# Patient Record
Sex: Male | Born: 1945 | Race: White | Hispanic: No | Marital: Married | Smoking: Never smoker
Health system: Southern US, Community
[De-identification: ages and names within clinical notes are randomized; demographics above are authoritative.]

## PROBLEM LIST (undated history)

## (undated) DIAGNOSIS — I6522 Occlusion and stenosis of left carotid artery: Secondary | ICD-10-CM

## (undated) DIAGNOSIS — M109 Gout, unspecified: Secondary | ICD-10-CM

## (undated) DIAGNOSIS — S76111A Strain of right quadriceps muscle, fascia and tendon, initial encounter: Secondary | ICD-10-CM

## (undated) DIAGNOSIS — I10 Essential (primary) hypertension: Secondary | ICD-10-CM

## (undated) DIAGNOSIS — C61 Malignant neoplasm of prostate: Secondary | ICD-10-CM

## (undated) HISTORY — PX: PROSTATE BIOPSY: SHX241

## (undated) HISTORY — DX: Occlusion and stenosis of left carotid artery: I65.22

## (undated) HISTORY — PX: CYST EXCISION: SHX5701

## (undated) HISTORY — PX: HYDROCELE EXCISION: SHX482

---

## 1999-09-30 ENCOUNTER — Emergency Department (HOSPITAL_COMMUNITY): Admission: EM | Admit: 1999-09-30 | Discharge: 1999-09-30 | Payer: Self-pay | Admitting: Emergency Medicine

## 1999-09-30 ENCOUNTER — Encounter: Payer: Self-pay | Admitting: Emergency Medicine

## 2000-08-17 ENCOUNTER — Encounter (INDEPENDENT_AMBULATORY_CARE_PROVIDER_SITE_OTHER): Payer: Self-pay | Admitting: Specialist

## 2000-08-17 ENCOUNTER — Ambulatory Visit (HOSPITAL_COMMUNITY): Admission: RE | Admit: 2000-08-17 | Discharge: 2000-08-17 | Payer: Self-pay | Admitting: Gastroenterology

## 2004-06-07 ENCOUNTER — Encounter: Admission: RE | Admit: 2004-06-07 | Discharge: 2004-06-07 | Payer: Self-pay | Admitting: Nephrology

## 2004-06-24 ENCOUNTER — Encounter: Admission: RE | Admit: 2004-06-24 | Discharge: 2004-06-24 | Payer: Self-pay | Admitting: Nephrology

## 2006-08-13 ENCOUNTER — Ambulatory Visit (HOSPITAL_COMMUNITY): Admission: RE | Admit: 2006-08-13 | Discharge: 2006-08-13 | Payer: Self-pay | Admitting: Gastroenterology

## 2006-08-13 ENCOUNTER — Encounter (INDEPENDENT_AMBULATORY_CARE_PROVIDER_SITE_OTHER): Payer: Self-pay | Admitting: Gastroenterology

## 2007-03-24 ENCOUNTER — Emergency Department (HOSPITAL_COMMUNITY): Admission: EM | Admit: 2007-03-24 | Discharge: 2007-03-24 | Payer: Self-pay | Admitting: Family Medicine

## 2007-09-09 ENCOUNTER — Ambulatory Visit (HOSPITAL_BASED_OUTPATIENT_CLINIC_OR_DEPARTMENT_OTHER): Admission: RE | Admit: 2007-09-09 | Discharge: 2007-09-09 | Payer: Self-pay | Admitting: Urology

## 2007-09-09 ENCOUNTER — Encounter (INDEPENDENT_AMBULATORY_CARE_PROVIDER_SITE_OTHER): Payer: Self-pay | Admitting: Urology

## 2009-02-12 ENCOUNTER — Emergency Department (HOSPITAL_COMMUNITY): Admission: EM | Admit: 2009-02-12 | Discharge: 2009-02-12 | Payer: Self-pay | Admitting: Family Medicine

## 2009-06-10 ENCOUNTER — Emergency Department (HOSPITAL_COMMUNITY): Admission: EM | Admit: 2009-06-10 | Discharge: 2009-06-10 | Payer: Self-pay | Admitting: Emergency Medicine

## 2009-08-01 ENCOUNTER — Emergency Department (HOSPITAL_COMMUNITY): Admission: EM | Admit: 2009-08-01 | Discharge: 2009-08-01 | Payer: Self-pay | Admitting: Family Medicine

## 2009-08-04 ENCOUNTER — Encounter: Admission: RE | Admit: 2009-08-04 | Discharge: 2009-08-04 | Payer: Self-pay | Admitting: Internal Medicine

## 2010-04-24 LAB — DIFFERENTIAL
Basophils Absolute: 0 10*3/uL (ref 0.0–0.1)
Basophils Relative: 0 % (ref 0–1)
Eosinophils Relative: 1 % (ref 0–5)
Lymphs Abs: 1.4 10*3/uL (ref 0.7–4.0)
Neutro Abs: 9.2 10*3/uL — ABNORMAL HIGH (ref 1.7–7.7)
Neutrophils Relative %: 78 % — ABNORMAL HIGH (ref 43–77)

## 2010-04-24 LAB — CBC
Hemoglobin: 15.9 g/dL (ref 13.0–17.0)
MCHC: 34 g/dL (ref 30.0–36.0)
RDW: 14 % (ref 11.5–15.5)

## 2010-06-21 NOTE — Op Note (Signed)
NAMEALEXI, Gonzales              ACCOUNT NO.:  [id number]   MEDICAL RECORD NO.:  [id number]          PATIENT TYPE:  AMB   LOCATION:  [facility]                         FACILITY:  [facility]   PHYSICIAN:  Valetta Fuller, M.D.  DATE OF BIRTH:  [birth year]   DATE OF PROCEDURE:  09/09/2007  DATE OF DISCHARGE:                               OPERATIVE REPORT   PREOPERATIVE DIAGNOSIS:  Left hydrocele.   POSTOPERATIVE DIAGNOSIS:  Large left spermatocele (multiloculated).   PROCEDURE PERFORMED:  Scrotal exploration, with excision of  multiloculated left spermatocele.   SURGEON:  Valetta Fuller, M.D.   ANESTHESIA:  General.   INDICATIONS:  Adam Gonzales is a 65 year old male.  He in the past had  been evaluated for a significantly enlarged left hemiscrotum.  Ultrasound at that time showed what appeared to be a multiloculated  hydrocele, with a large amount of fluid around the testis and within the  left hemiscrotal compartment.  This did transilluminate and was felt to  contain clear fluid.  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  The patient was symptomatic, but not enough to warrant  surgery at that time.  He presented recently with increased size of his  left hemiscrotum and increased symptoms.  We discussed the pros and cons  of surgical treatment of this, and he elected to proceed with that.  He  appeared to understand the advantages and disadvantages and potential  complications of this type of surgery.   TECHNIQUE AND FINDINGS:  The patient was brought to the operating room,  where he had successful induction of general anesthesia.  He was placed  in the supine position and prepped and draped in the usual manner.  A 4-  5 cm incision was made over the median raphe.  The left hemiscrotal  compartment was opened.  It was clear at that time that this was not a  hydrocele surrounding the testis, but it was actually a huge spermatic  seal that had folded back over around the  testis.  The large  spermatocele was partially decompressed by making an incision.  This  appeared to be thin walled and contained clear yellow fluid.  Once this  was partially decompressed, we were able to remove the testis with this  large, multiloculated spermatocele.  Pertinent vasculature and vas  deferens were identified and then preserved.  A combination of sharp and  blunt dissective technique as well as electrocautery were used to excise  this large, multiloculated spermatic seal from off the epididymis.  Again, there were no worrisome features, no thickening, no evidence of  solid mass, and the fluid was all clear.  This entire cystic area was  excised.  No other pathology was appreciated.  The testis was carefully  returned to the left hemiscrotum.  The dartos musculature was  closed with a running 3-0 Vicryl suture, and the skin was closed with a  running 4-0.  The patient appeared to tolerate the procedure well.  There were no obvious complications or problems.  He was brought to the  recovery room in stable condition.  Valetta Fuller, M.D.  Electronically Signed     DSG/MEDQ  D:  09/09/2007  T:  09/09/2007  Job:  [id number]

## 2010-06-21 NOTE — Op Note (Signed)
NAMEZACKARIE, Adam Gonzales              ACCOUNT NO.:  [id number]   MEDICAL RECORD NO.:  [id number]          PATIENT TYPE:  AMB   LOCATION:  ENDO                         FACILITY:  [facility]   PHYSICIAN:  Bernette Redbird, M.D.   DATE OF BIRTH:  [birth year]   DATE OF PROCEDURE:  08/13/2006  DATE OF DISCHARGE:                               OPERATIVE REPORT   PROCEDURE:  Colonoscopy and polypectomy.   INDICATIONS:  A 65 year old with history of a diminutive adenoma removed  colonoscopically about 5 years ago.   FINDINGS:  Small rectal polyp, removed.   PROCEDURE:  The nature, purpose, and risks of the procedure were  familiar to the patient from prior examination.  He provided written  consent.  Sedation was fentanyl 75 mcg and Versed 7.5 mg IV, without  arrhythmias or desaturation.  The Pentax adult video colonoscope was  advanced to the cecum without significant difficulty, using just a  little bit of external abdominal compression to enter the base of the  cecum.  The cecum was identified by visualization of the appendiceal  orifice and the ileocecal valve.  Pullback was then performed.  The  quality of the prep was very good, and it is felt that all areas were  well seen.   There was a 4 mm sessile polyp in the midrectum, removed by cold snare  and retrieved by suctioning through the scope.  It appears excision was  either complete essentially complete by doing so.  No other polyps were  seen, and the exam was otherwise normal, without evidence of cancer,  colitis, vascular malformations, or diverticulosis.  Retroflexion in the  rectum and reinspection of the rectum were otherwise unremarkable.   The patient tolerated the procedure well, and there were no apparent  complications.   IMPRESSION:  1. Solitary small rectal polyp, removed as described above (211.4).  2. Prior history of colonic adenoma.   PLAN:  1. Await pathology results.  2. The patient will need a follow-up colonoscopy  in 5 years,      regardless of the findings on current polyp, in view of the prior      history of colonic adenoma having been removed.           ______________________________  Bernette Redbird, M.D.     RB/MEDQ  D:  08/13/2006  T:  08/13/2006  Job:  [id number]   cc:   Erskine Speed, M.D.  Fax: [phone]

## 2010-06-24 NOTE — Procedures (Signed)
[place]. [facility]  Patient:    Adam Gonzales, Adam Gonzales                     MRN: [id number] Proc. Date: 08/17/00 Adm. Date:  [id number] Attending:  Rich Brave CC:         Erskine Speed, M.D.   Procedure Report  PROCEDURE:  Colonoscopy with biopsies.  INDICATIONS:  A 65 year old for colon cancer screening.  FINDINGS:  Diminutive sessile polyps in the rectosigmoid, biopsied.  PROCEDURE:  The nature, purpose, and risks of the procedure had been discussed with the patient, who provided written consent.  The Olympus adult video colonoscope was advanced to the cecum as identified by clear visualization of the appendiceal orifice and pullback was then performed.  The quality of the prep was excellent and it was felt that all areas were well seen.  This was a normal examination a part from the presence of some small sessile polyps scattered in the rectosigmoid area.  There was one in the rectum at about 10 cm that was 2 mm across, but appeared to be a fairly solid polypoid piece of tissue, whereas the others in the sigmoid region appeared more translucent, almost like mucosal cysts.  No large polyps, cancer, colitis, vascular malformations or diverticulosis were observed.  Retroflexion was not performed in the rectum, but antegrade viewing disclosed no distal rectal lesions.  There were mild to moderate internal hemorrhoids observed during pullout through the anal canal.  The patient tolerated this procedure well and there were no apparent complications.  IMPRESSION:  Diminutive distal polyps as described above.  PLAN:  Await pathology. DD:  08/17/00 TD:  08/17/00 Job: [id number]

## 2010-08-14 ENCOUNTER — Inpatient Hospital Stay (INDEPENDENT_AMBULATORY_CARE_PROVIDER_SITE_OTHER)
Admission: RE | Admit: 2010-08-14 | Discharge: 2010-08-14 | Disposition: A | Discharge status: Home / Self Care | Payer: 59 | Source: Ambulatory Visit | Attending: Emergency Medicine | Admitting: Emergency Medicine

## 2010-08-14 DIAGNOSIS — T6391XA Toxic effect of contact with unspecified venomous animal, accidental (unintentional), initial encounter: Secondary | ICD-10-CM

## 2010-11-04 LAB — POCT I-STAT 4, (NA,K, GLUC, HGB,HCT)
Glucose, Bld: 117 — ABNORMAL HIGH
Potassium: 3.8

## 2011-07-11 ENCOUNTER — Encounter (HOSPITAL_COMMUNITY): Payer: Self-pay

## 2011-07-11 ENCOUNTER — Emergency Department (HOSPITAL_COMMUNITY)
Admission: EM | Admit: 2011-07-11 | Discharge: 2011-07-11 | Disposition: A | Discharge status: Home / Self Care | Payer: 59 | Source: Home / Self Care | Attending: Emergency Medicine | Admitting: Emergency Medicine

## 2011-07-11 DIAGNOSIS — M109 Gout, unspecified: Secondary | ICD-10-CM

## 2011-07-11 HISTORY — DX: Essential (primary) hypertension: I10

## 2011-07-11 LAB — URIC ACID: Uric Acid, Serum: 6.1 mg/dL (ref 4.0–7.8)

## 2011-07-11 MED ORDER — INDOMETHACIN ER 75 MG PO CPCR: 75.0000 mg | ORAL_CAPSULE | Freq: Every day | ORAL | Status: AC

## 2011-07-11 MED ORDER — COLCHICINE 0.6 MG PO TABS: 0.6000 mg | ORAL_TABLET | Freq: Every day | ORAL | Status: DC

## 2011-07-11 MED ORDER — TETANUS-DIPHTH-ACELL PERTUSSIS 5-2.5-18.5 LF-MCG/0.5 IM SUSP
INTRAMUSCULAR | Status: AC
  Filled 2011-07-11: qty 0.5

## 2011-07-11 NOTE — ED Notes (Signed)
C/o pain and swelling in foot, denies trauma; NAD

## 2011-07-11 NOTE — ED Provider Notes (Signed)
History     CSN: [id number]  Arrival date & time 07/11/11  1101   First MD Initiated Contact with Patient 07/11/11 1114      Chief Complaint  Patient presents with  . Foot Pain    (Consider location/radiation/quality/duration/timing/severity/associated sxs/prior treatment) HPI Comments: For about 2 days right foot and ankle have been swollen tender somewhat red. It's all over my ankle in his swelling and not know what it is. He was diagnosed by my Dr. with gout but it was in the other foot and on my big toe area. It hurts to walk on it or touch it. I have not had any injuries or falls. Patient denies any systemic symptoms such as fevers, malaise, unintentional weight loss or other joint pains.  Patient is a 66 y.o. male presenting with lower extremity pain.  Foot Pain This is a new problem. The current episode started 2 days ago. The problem occurs constantly. The problem has not changed since onset.The symptoms are aggravated by walking.    Past Medical History  Diagnosis Date  . Hypertension     History reviewed. No pertinent past surgical history.  History reviewed. No pertinent family history.  History  Substance Use Topics  . Smoking status: Never Smoker   . Smokeless tobacco: Not on file  . Alcohol Use: No      Review of Systems  Constitutional: Positive for activity change. Negative for fever, chills, diaphoresis, appetite change and fatigue.  Musculoskeletal: Positive for joint swelling and arthralgias. Negative for myalgias and back pain.  Skin: Negative for color change and rash.    Allergies  Review of patient's allergies indicates no known allergies.  Home Medications   Current Outpatient Rx  Name Route Sig Dispense Refill  . COLCHICINE 0.6 MG PO TABS Oral Take 0.6 mg by mouth daily.    . DUTASTERIDE 0.5 MG PO CAPS Oral Take 0.5 mg by mouth daily.    Marland Kitchen SPIRONOLACTONE 25 MG PO TABS Oral Take 25 mg by mouth daily.    Marland Kitchen TAMSULOSIN HCL 0.4 MG PO CAPS  Oral Take by mouth.    . TELMISARTAN-HCTZ 80-25 MG PO TABS Oral Take 0.5 tablets by mouth daily.    . COLCHICINE 0.6 MG PO TABS Oral Take 1 tablet (0.6 mg total) by mouth daily. 15 tablet 0  . INDOMETHACIN ER 75 MG PO CPCR Oral Take 1 capsule (75 mg total) by mouth daily. 15 capsule 0    BP 148/80  Pulse 83  Temp(Src) 97.6 F (36.4 C) (Oral)  Resp 16  SpO2 96%  Physical Exam  Nursing note and vitals reviewed. Constitutional: Vital signs are normal. He appears well-developed and well-nourished.  Non-toxic appearance. He does not have a sickly appearance. He does not appear ill. No distress.  Musculoskeletal:       Right ankle: He exhibits swelling. He exhibits no ecchymosis, no deformity and no laceration. tenderness. Medial malleolus tenderness found.       Feet:  Neurological: He is alert. He exhibits normal muscle tone. Coordination normal.  Skin: No rash noted. There is erythema. No pallor.    ED Course  Procedures (including critical care time)  Labs Reviewed  SEDIMENTATION RATE - Abnormal; Notable for the following:    Sed Rate 22 (*)    All other components within normal limits  URIC ACID   No results found.   1. Gout       MDM  Symptoms exam and history were suggestive of  gout. This last episode started about 72 hours. Patient was prescribed indomethacin course along with colchicine. Patient agree treatment plan and followup care we have done some preliminary labs.        Jimmie Molly, MD 07/11/11 2125

## 2011-07-11 NOTE — Discharge Instructions (Signed)
Please read discharge instructions including purulent restricted diet. Use his medicines as prescribed and avoid taking any over-the-counter medicines and you will need to discontinue the etodolac. This episode can last up to 2 weeks. Try to keep your foot elevated at night. We will contact you only if abnormal test results. Your symptoms and exam were consistent with gout.

## 2011-07-12 NOTE — ED Notes (Signed)
Sed rate 22 H, Uric Acid 6.1 WNL.  Dr. Ladon Applebaum notified. Vassie Moselle 07/12/2011

## 2011-07-18 ENCOUNTER — Telehealth (HOSPITAL_COMMUNITY): Payer: Self-pay | Admitting: *Deleted

## 2011-07-18 NOTE — ED Notes (Signed)
Sed Rate 22 H and Uric Acid 6.1 WNL.  Lab shown to Dr. Ladon Applebaum and he said to notify pt. his sed rate was elevated as I told him was likely.  I called pt. Pt. verified x 2 and given results. I asked pt. If he had a PCP and he said yes Dr. Nila Nephew.  He asked me to send the labs to him. I told him I would and I did. Vassie Moselle 07/18/2011

## 2012-01-02 ENCOUNTER — Emergency Department (HOSPITAL_COMMUNITY)
Admission: EM | Admit: 2012-01-02 | Discharge: 2012-01-02 | Disposition: A | Discharge status: Home / Self Care | Payer: 59 | Source: Home / Self Care | Attending: Family Medicine | Admitting: Family Medicine

## 2012-01-02 ENCOUNTER — Encounter (HOSPITAL_COMMUNITY): Payer: Self-pay | Admitting: Emergency Medicine

## 2012-01-02 ENCOUNTER — Emergency Department (INDEPENDENT_AMBULATORY_CARE_PROVIDER_SITE_OTHER): Payer: 59

## 2012-01-02 DIAGNOSIS — S40019A Contusion of unspecified shoulder, initial encounter: Secondary | ICD-10-CM

## 2012-01-02 DIAGNOSIS — S40011A Contusion of right shoulder, initial encounter: Secondary | ICD-10-CM

## 2012-01-02 HISTORY — DX: Gout, unspecified: M10.9

## 2012-01-02 MED ORDER — HYDROCODONE-ACETAMINOPHEN 5-325 MG PO TABS: 1.0000 | ORAL_TABLET | Freq: Four times a day (QID) | ORAL | Status: DC | PRN

## 2012-01-02 NOTE — ED Notes (Signed)
Assisted moving a desk yesterday, including hitting shoulder into door facing.  Gradually has increased with soreness.  Dr Artis Flock at bedside

## 2012-01-02 NOTE — ED Provider Notes (Signed)
History     CSN: [id number]  Arrival date & time 01/02/12  1446   First MD Initiated Contact with Patient 01/02/12 1446      Chief Complaint  Patient presents with  . Shoulder Pain    (Consider location/radiation/quality/duration/timing/severity/associated sxs/prior treatment) Patient is a 66 y.o. male presenting with shoulder injury. The history is provided by the patient.  Shoulder Injury This is a new problem. The current episode started yesterday (struck right shoulder against door frame helping move a desk , pain getting worse.). The problem has been gradually worsening.    Past Medical History  Diagnosis Date  . Hypertension   . Gout     Past Surgical History  Procedure Date  . Cyst excision     No family history on file.  History  Substance Use Topics  . Smoking status: Never Smoker   . Smokeless tobacco: Not on file  . Alcohol Use: No      Review of Systems  Constitutional: Negative.   Musculoskeletal: Positive for myalgias. Negative for back pain and joint swelling.  Skin: Negative.     Allergies  Review of patient's allergies indicates no known allergies.  Home Medications   Current Outpatient Rx  Name  Route  Sig  Dispense  Refill  . IBUPROFEN 200 MG PO TABS   Oral   Take 200 mg by mouth every 6 (six) hours as needed.         Marland Kitchen SPIRONOLACTONE 25 MG PO TABS   Oral   Take 25 mg by mouth daily.         . TELMISARTAN-HCTZ 80-25 MG PO TABS   Oral   Take 0.5 tablets by mouth daily.         Marland Kitchen TARKA PO   Oral   Take by mouth.         . COLCHICINE 0.6 MG PO TABS   Oral   Take 0.6 mg by mouth daily.         . COLCHICINE 0.6 MG PO TABS   Oral   Take 1 tablet (0.6 mg total) by mouth daily.   15 tablet   0   . DUTASTERIDE 0.5 MG PO CAPS   Oral   Take 0.5 mg by mouth daily.         Marland Kitchen HYDROCODONE-ACETAMINOPHEN 5-325 MG PO TABS   Oral   Take 1 tablet by mouth every 6 (six) hours as needed for pain.   12 tablet   0   .  TAMSULOSIN HCL 0.4 MG PO CAPS   Oral   Take by mouth.           BP 192/93  Pulse 80  Temp 98.2 F (36.8 C) (Oral)  Resp 20  SpO2 94%  Physical Exam  Nursing note and vitals reviewed. Constitutional: He is oriented to person, place, and time. He appears well-developed and well-nourished.  Musculoskeletal: He exhibits tenderness.       Right shoulder: He exhibits decreased range of motion and tenderness. He exhibits no swelling, no effusion and normal pulse.       Arms: Neurological: He is alert and oriented to person, place, and time.  Skin: Skin is warm and dry.       No visible trauma.    ED Course  Procedures (including critical care time)  Labs Reviewed - No data to display Dg Shoulder Right  01/02/2012  *RADIOLOGY REPORT*  Clinical Data: The patient hit his right shoulder on  wall yesterday, pain with motion  RIGHT SHOULDER - 2+ VIEW  Comparison: None.  Findings: There is no fracture or dislocation.There is supraclavicular soft tissue fullness suggesting a post-traumatic hematoma.  IMPRESSION: No acute osseous findings.  Probable hematoma.   Original Report Authenticated By: Esperanza Heir, M.D.      1. Contusion of shoulder, right       MDM          Linna Hoff, MD 01/02/12 [phone]

## 2012-02-27 ENCOUNTER — Other Ambulatory Visit: Payer: Self-pay | Admitting: Gastroenterology

## 2012-06-28 ENCOUNTER — Other Ambulatory Visit (HOSPITAL_COMMUNITY): Payer: Self-pay | Admitting: Urology

## 2012-06-28 DIAGNOSIS — R972 Elevated prostate specific antigen [PSA]: Secondary | ICD-10-CM

## 2012-09-09 ENCOUNTER — Ambulatory Visit (HOSPITAL_COMMUNITY)
Admission: RE | Admit: 2012-09-09 | Discharge: 2012-09-09 | Disposition: A | Discharge status: Home / Self Care | Payer: 59 | Source: Ambulatory Visit | Attending: Urology | Admitting: Urology

## 2012-09-09 DIAGNOSIS — R972 Elevated prostate specific antigen [PSA]: Secondary | ICD-10-CM | POA: Insufficient documentation

## 2012-09-09 DIAGNOSIS — N323 Diverticulum of bladder: Secondary | ICD-10-CM | POA: Insufficient documentation

## 2012-09-09 DIAGNOSIS — N402 Nodular prostate without lower urinary tract symptoms: Secondary | ICD-10-CM | POA: Insufficient documentation

## 2012-09-09 DIAGNOSIS — R300 Dysuria: Secondary | ICD-10-CM | POA: Insufficient documentation

## 2012-09-09 MED ORDER — GADOBENATE DIMEGLUMINE 529 MG/ML IV SOLN
20.0000 mL | Freq: Once | INTRAVENOUS | Status: AC | PRN
  Administered 2012-09-09: 19 mL via INTRAVENOUS

## 2012-09-10 LAB — POCT I-STAT, CHEM 8
BUN: 16 mg/dL (ref 6–23)
Calcium, Ion: 1.16 mmol/L (ref 1.13–1.30)
Creatinine, Ser: 1.1 mg/dL (ref 0.50–1.35)
Glucose, Bld: 118 mg/dL — ABNORMAL HIGH (ref 70–99)
HCT: 51 % (ref 39.0–52.0)
Potassium: 3.4 mEq/L — ABNORMAL LOW (ref 3.5–5.1)
Sodium: 138 mEq/L (ref 135–145)
TCO2: 25 mmol/L (ref 0–100)

## 2015-04-23 DIAGNOSIS — N400 Enlarged prostate without lower urinary tract symptoms: Secondary | ICD-10-CM | POA: Diagnosis not present

## 2015-04-23 DIAGNOSIS — I159 Secondary hypertension, unspecified: Secondary | ICD-10-CM | POA: Diagnosis not present

## 2015-04-23 DIAGNOSIS — Z8739 Personal history of other diseases of the musculoskeletal system and connective tissue: Secondary | ICD-10-CM | POA: Diagnosis not present

## 2015-04-23 DIAGNOSIS — E269 Hyperaldosteronism, unspecified: Secondary | ICD-10-CM | POA: Diagnosis not present

## 2015-04-23 DIAGNOSIS — Z0000 Encounter for general adult medical examination without abnormal findings: Secondary | ICD-10-CM | POA: Diagnosis not present

## 2015-04-23 DIAGNOSIS — R319 Hematuria, unspecified: Secondary | ICD-10-CM | POA: Diagnosis not present

## 2015-04-30 DIAGNOSIS — I159 Secondary hypertension, unspecified: Secondary | ICD-10-CM | POA: Diagnosis not present

## 2015-04-30 DIAGNOSIS — R7309 Other abnormal glucose: Secondary | ICD-10-CM | POA: Diagnosis not present

## 2015-04-30 DIAGNOSIS — N390 Urinary tract infection, site not specified: Secondary | ICD-10-CM | POA: Diagnosis not present

## 2015-07-19 DIAGNOSIS — H5203 Hypermetropia, bilateral: Secondary | ICD-10-CM | POA: Diagnosis not present

## 2015-11-05 DIAGNOSIS — N401 Enlarged prostate with lower urinary tract symptoms: Secondary | ICD-10-CM | POA: Diagnosis not present

## 2015-11-05 DIAGNOSIS — R350 Frequency of micturition: Secondary | ICD-10-CM | POA: Diagnosis not present

## 2015-11-05 DIAGNOSIS — R972 Elevated prostate specific antigen [PSA]: Secondary | ICD-10-CM | POA: Diagnosis not present

## 2015-11-11 DIAGNOSIS — M62838 Other muscle spasm: Secondary | ICD-10-CM | POA: Diagnosis not present

## 2015-11-12 ENCOUNTER — Other Ambulatory Visit (HOSPITAL_COMMUNITY): Payer: Self-pay | Admitting: Urology

## 2015-11-12 DIAGNOSIS — R972 Elevated prostate specific antigen [PSA]: Secondary | ICD-10-CM

## 2015-12-01 ENCOUNTER — Ambulatory Visit (HOSPITAL_COMMUNITY)
Admission: RE | Admit: 2015-12-01 | Discharge: 2015-12-01 | Disposition: A | Discharge status: Home / Self Care | Payer: Medicare Other | Source: Ambulatory Visit | Attending: Urology | Admitting: Urology

## 2015-12-01 DIAGNOSIS — N400 Enlarged prostate without lower urinary tract symptoms: Secondary | ICD-10-CM | POA: Insufficient documentation

## 2015-12-01 DIAGNOSIS — N320 Bladder-neck obstruction: Secondary | ICD-10-CM | POA: Diagnosis not present

## 2015-12-01 DIAGNOSIS — R972 Elevated prostate specific antigen [PSA]: Secondary | ICD-10-CM | POA: Diagnosis not present

## 2015-12-01 LAB — POCT I-STAT CREATININE: Creatinine, Ser: 1.1 mg/dL (ref 0.61–1.24)

## 2015-12-01 MED ORDER — GADOBENATE DIMEGLUMINE 529 MG/ML IV SOLN
18.0000 mL | Freq: Once | INTRAVENOUS | Status: AC | PRN
  Administered 2015-12-01: 18 mL via INTRAVENOUS

## 2015-12-06 DIAGNOSIS — N529 Male erectile dysfunction, unspecified: Secondary | ICD-10-CM | POA: Diagnosis not present

## 2015-12-06 DIAGNOSIS — I159 Secondary hypertension, unspecified: Secondary | ICD-10-CM | POA: Diagnosis not present

## 2015-12-06 DIAGNOSIS — Z8739 Personal history of other diseases of the musculoskeletal system and connective tissue: Secondary | ICD-10-CM | POA: Diagnosis not present

## 2015-12-06 DIAGNOSIS — E669 Obesity, unspecified: Secondary | ICD-10-CM | POA: Diagnosis not present

## 2015-12-06 DIAGNOSIS — R319 Hematuria, unspecified: Secondary | ICD-10-CM | POA: Diagnosis not present

## 2015-12-06 DIAGNOSIS — E269 Hyperaldosteronism, unspecified: Secondary | ICD-10-CM | POA: Diagnosis not present

## 2015-12-06 DIAGNOSIS — N390 Urinary tract infection, site not specified: Secondary | ICD-10-CM | POA: Diagnosis not present

## 2015-12-08 DIAGNOSIS — Z23 Encounter for immunization: Secondary | ICD-10-CM | POA: Diagnosis not present

## 2015-12-22 DIAGNOSIS — R319 Hematuria, unspecified: Secondary | ICD-10-CM | POA: Diagnosis not present

## 2016-01-20 DIAGNOSIS — D126 Benign neoplasm of colon, unspecified: Secondary | ICD-10-CM | POA: Diagnosis not present

## 2016-01-20 DIAGNOSIS — K635 Polyp of colon: Secondary | ICD-10-CM | POA: Diagnosis not present

## 2016-01-20 DIAGNOSIS — D125 Benign neoplasm of sigmoid colon: Secondary | ICD-10-CM | POA: Diagnosis not present

## 2016-01-20 DIAGNOSIS — K5730 Diverticulosis of large intestine without perforation or abscess without bleeding: Secondary | ICD-10-CM | POA: Diagnosis not present

## 2016-01-20 DIAGNOSIS — Z86010 Personal history of colonic polyps: Secondary | ICD-10-CM | POA: Diagnosis not present

## 2016-01-20 DIAGNOSIS — K621 Rectal polyp: Secondary | ICD-10-CM | POA: Diagnosis not present

## 2016-01-25 DIAGNOSIS — Z0001 Encounter for general adult medical examination with abnormal findings: Secondary | ICD-10-CM | POA: Diagnosis not present

## 2016-01-25 DIAGNOSIS — I10 Essential (primary) hypertension: Secondary | ICD-10-CM | POA: Diagnosis not present

## 2016-01-25 DIAGNOSIS — Z23 Encounter for immunization: Secondary | ICD-10-CM | POA: Diagnosis not present

## 2016-01-25 DIAGNOSIS — K635 Polyp of colon: Secondary | ICD-10-CM | POA: Diagnosis not present

## 2016-01-25 DIAGNOSIS — D126 Benign neoplasm of colon, unspecified: Secondary | ICD-10-CM | POA: Diagnosis not present

## 2016-02-15 DIAGNOSIS — I10 Essential (primary) hypertension: Secondary | ICD-10-CM | POA: Diagnosis not present

## 2016-03-14 DIAGNOSIS — E269 Hyperaldosteronism, unspecified: Secondary | ICD-10-CM | POA: Diagnosis not present

## 2016-03-14 DIAGNOSIS — I10 Essential (primary) hypertension: Secondary | ICD-10-CM | POA: Diagnosis not present

## 2016-05-29 DIAGNOSIS — I159 Secondary hypertension, unspecified: Secondary | ICD-10-CM | POA: Diagnosis not present

## 2016-05-29 DIAGNOSIS — Z8739 Personal history of other diseases of the musculoskeletal system and connective tissue: Secondary | ICD-10-CM | POA: Diagnosis not present

## 2016-05-29 DIAGNOSIS — R7309 Other abnormal glucose: Secondary | ICD-10-CM | POA: Diagnosis not present

## 2016-05-29 DIAGNOSIS — E269 Hyperaldosteronism, unspecified: Secondary | ICD-10-CM | POA: Diagnosis not present

## 2016-06-05 DIAGNOSIS — R319 Hematuria, unspecified: Secondary | ICD-10-CM | POA: Diagnosis not present

## 2016-07-03 ENCOUNTER — Encounter (HOSPITAL_COMMUNITY): Payer: Self-pay | Admitting: Emergency Medicine

## 2016-07-03 ENCOUNTER — Ambulatory Visit (HOSPITAL_COMMUNITY)
Admission: EM | Admit: 2016-07-03 | Discharge: 2016-07-03 | Disposition: A | Discharge status: Home / Self Care | Payer: Medicare Other | Attending: Family Medicine | Admitting: Family Medicine

## 2016-07-03 DIAGNOSIS — W57XXXA Bitten or stung by nonvenomous insect and other nonvenomous arthropods, initial encounter: Secondary | ICD-10-CM

## 2016-07-03 DIAGNOSIS — S70362A Insect bite (nonvenomous), left thigh, initial encounter: Secondary | ICD-10-CM | POA: Diagnosis not present

## 2016-07-03 MED ORDER — DOXYCYCLINE HYCLATE 100 MG PO TABS: 100.0000 mg | ORAL_TABLET | Freq: Two times a day (BID) | ORAL | 0 refills | Status: DC

## 2016-07-03 NOTE — ED Provider Notes (Signed)
[place]    CSN: [id number] Arrival date & time: 07/03/16  1425     History   Chief Complaint Chief Complaint  Patient presents with  . Insect Bite    HPI Adam Gonzales is a 71 y.o. male.   The patient presented to the [facility] with a complaint of an insect bite. The patient reported that he noticed a tick on his inner left thigh today. The patient stated that he removed the tick. He believes that there is a little bit of the tick pincer still left      Past Medical History:  Diagnosis Date  . Gout   . Hypertension     There are no active problems to display for this patient.   Past Surgical History:  Procedure Laterality Date  . CYST EXCISION         Home Medications    Prior to Admission medications   Medication Sig Start Date End Date Taking? Authorizing Provider  spironolactone (ALDACTONE) 25 MG tablet Take 25 mg by mouth daily.   Yes [provider]  Tamsulosin HCl (FLOMAX) 0.4 MG CAPS Take by mouth.   Yes [provider]  telmisartan-hydrochlorothiazide (MICARDIS HCT) 80-25 MG per tablet Take 0.5 tablets by mouth daily.   Yes [provider]  Trandolapril-Verapamil HCl (TARKA PO) Take by mouth.   Yes [provider]  doxycycline (VIBRA-TABS) 100 MG tablet Take 1 tablet (100 mg total) by mouth 2 (two) times daily. 07/03/16   Robyn Haber, MD    Family History History reviewed. No pertinent family history.  Social History Social History  Substance Use Topics  . Smoking status: Never Smoker  . Smokeless tobacco: Not on file  . Alcohol use No     Allergies   Patient has no known allergies.   Review of Systems Review of Systems  Skin: Positive for wound.  All other systems reviewed and are negative.    Physical Exam Triage Vital Signs ED Triage Vitals  Enc Vitals Group     BP 07/03/16 1519 (!) 145/54     Pulse Rate 07/03/16 1519 64     Resp 07/03/16 1519 16     Temp 07/03/16 1519  98.2 F (36.8 C)     Temp Source 07/03/16 1519 Oral     SpO2 07/03/16 1519 97 %     Weight --      Height --      Head Circumference --      Peak Flow --      Pain Score 07/03/16 1518 0     Pain Loc --      Pain Edu? --      Excl. in [place]? --    No data found.   Updated Vital Signs BP (!) 145/54 (BP Location: Left Arm)   Pulse 64   Temp 98.2 F (36.8 C) (Oral)   Resp 16   SpO2 97%    Physical Exam  Constitutional: He is oriented to person, place, and time. He appears well-developed and well-nourished.  HENT:  Right Ear: External ear normal.  Left Ear: External ear normal.  Mouth/Throat: Oropharynx is clear and moist.  Eyes: Conjunctivae are normal. Pupils are equal, round, and reactive to light.  Neck: Normal range of motion. Neck supple.  Pulmonary/Chest: Effort normal.  Musculoskeletal: Normal range of motion.  Neurological: He is alert and oriented to person, place, and time.  Skin: Skin is warm and dry.  There is a  very small blanks back on the inner left thigh that may represent a pincer from the tick. There is some mild surrounding erythema.  Nursing note and vitals reviewed.    UC Treatments / Results  Labs (all labs ordered are listed, but only abnormal results are displayed) Labs Reviewed - No data to display  EKG  EKG Interpretation None       Radiology No results found.  Procedures Procedures (including critical care time)  Medications Ordered in UC Medications - No data to display   Initial Impression / Assessment and Plan / UC Course  I have reviewed the triage vital signs and the nursing notes.  Pertinent labs & imaging results that were available during my care of the patient were reviewed by me and considered in my medical decision making (see chart for details).      Final Clinical Impressions(s) / UC Diagnoses   Final diagnoses:  Tick bite, initial encounter    New Prescriptions New Prescriptions   DOXYCYCLINE  (VIBRA-TABS) 100 MG TABLET    Take 1 tablet (100 mg total) by mouth 2 (two) times daily.     Robyn Haber, MD 07/03/16 [phone]

## 2016-07-03 NOTE — Discharge Instructions (Signed)
Wash the area thoroughly with soap and water every day until the black speck is resolved.

## 2016-07-03 NOTE — ED Triage Notes (Signed)
The patient presented to the [facility] with a complaint of an insect bite. The patient reported that he noticed a tick on his inner left thigh today. The patient stated that he removed the tick.

## 2016-07-31 DIAGNOSIS — N401 Enlarged prostate with lower urinary tract symptoms: Secondary | ICD-10-CM | POA: Diagnosis not present

## 2016-07-31 DIAGNOSIS — R3914 Feeling of incomplete bladder emptying: Secondary | ICD-10-CM | POA: Diagnosis not present

## 2016-07-31 DIAGNOSIS — R972 Elevated prostate specific antigen [PSA]: Secondary | ICD-10-CM | POA: Diagnosis not present

## 2016-08-11 DIAGNOSIS — E269 Hyperaldosteronism, unspecified: Secondary | ICD-10-CM | POA: Diagnosis not present

## 2016-08-11 DIAGNOSIS — M119 Crystal arthropathy, unspecified: Secondary | ICD-10-CM | POA: Diagnosis not present

## 2016-08-11 DIAGNOSIS — I10 Essential (primary) hypertension: Secondary | ICD-10-CM | POA: Diagnosis not present

## 2016-11-29 DIAGNOSIS — Z23 Encounter for immunization: Secondary | ICD-10-CM | POA: Diagnosis not present

## 2017-01-16 DIAGNOSIS — I159 Secondary hypertension, unspecified: Secondary | ICD-10-CM | POA: Diagnosis not present

## 2017-01-16 DIAGNOSIS — Z8739 Personal history of other diseases of the musculoskeletal system and connective tissue: Secondary | ICD-10-CM | POA: Diagnosis not present

## 2017-01-16 DIAGNOSIS — E269 Hyperaldosteronism, unspecified: Secondary | ICD-10-CM | POA: Diagnosis not present

## 2017-01-31 DIAGNOSIS — R972 Elevated prostate specific antigen [PSA]: Secondary | ICD-10-CM | POA: Diagnosis not present

## 2017-01-31 DIAGNOSIS — I10 Essential (primary) hypertension: Secondary | ICD-10-CM | POA: Diagnosis not present

## 2017-01-31 DIAGNOSIS — E269 Hyperaldosteronism, unspecified: Secondary | ICD-10-CM | POA: Diagnosis not present

## 2017-01-31 DIAGNOSIS — R8290 Unspecified abnormal findings in urine: Secondary | ICD-10-CM | POA: Diagnosis not present

## 2017-01-31 DIAGNOSIS — K635 Polyp of colon: Secondary | ICD-10-CM | POA: Diagnosis not present

## 2017-01-31 DIAGNOSIS — R7301 Impaired fasting glucose: Secondary | ICD-10-CM | POA: Diagnosis not present

## 2017-02-15 DIAGNOSIS — N401 Enlarged prostate with lower urinary tract symptoms: Secondary | ICD-10-CM | POA: Diagnosis not present

## 2017-02-15 DIAGNOSIS — R972 Elevated prostate specific antigen [PSA]: Secondary | ICD-10-CM | POA: Diagnosis not present

## 2017-02-15 DIAGNOSIS — R3914 Feeling of incomplete bladder emptying: Secondary | ICD-10-CM | POA: Diagnosis not present

## 2017-02-15 DIAGNOSIS — R311 Benign essential microscopic hematuria: Secondary | ICD-10-CM | POA: Diagnosis not present

## 2017-04-06 DIAGNOSIS — I159 Secondary hypertension, unspecified: Secondary | ICD-10-CM | POA: Diagnosis not present

## 2017-04-12 ENCOUNTER — Emergency Department (HOSPITAL_COMMUNITY)
Admission: EM | Admit: 2017-04-12 | Discharge: 2017-04-12 | Disposition: A | Discharge status: Home / Self Care | Payer: Medicare Other | Attending: Emergency Medicine | Admitting: Emergency Medicine

## 2017-04-12 ENCOUNTER — Other Ambulatory Visit: Payer: Self-pay

## 2017-04-12 ENCOUNTER — Encounter (HOSPITAL_COMMUNITY): Payer: Self-pay | Admitting: Emergency Medicine

## 2017-04-12 ENCOUNTER — Emergency Department (HOSPITAL_COMMUNITY): Payer: Medicare Other

## 2017-04-12 DIAGNOSIS — I10 Essential (primary) hypertension: Secondary | ICD-10-CM | POA: Diagnosis not present

## 2017-04-12 DIAGNOSIS — Z79899 Other long term (current) drug therapy: Secondary | ICD-10-CM | POA: Diagnosis not present

## 2017-04-12 DIAGNOSIS — I6523 Occlusion and stenosis of bilateral carotid arteries: Secondary | ICD-10-CM | POA: Diagnosis not present

## 2017-04-12 DIAGNOSIS — I6522 Occlusion and stenosis of left carotid artery: Secondary | ICD-10-CM | POA: Diagnosis not present

## 2017-04-12 DIAGNOSIS — R404 Transient alteration of awareness: Secondary | ICD-10-CM | POA: Diagnosis not present

## 2017-04-12 DIAGNOSIS — R42 Dizziness and giddiness: Secondary | ICD-10-CM | POA: Diagnosis not present

## 2017-04-12 LAB — BASIC METABOLIC PANEL
ANION GAP: 13 (ref 5–15)
BUN: 24 mg/dL — AB (ref 6–20)
CHLORIDE: 100 mmol/L — AB (ref 101–111)
CO2: 25 mmol/L (ref 22–32)
Calcium: 9.7 mg/dL (ref 8.9–10.3)
Creatinine, Ser: 1.13 mg/dL (ref 0.61–1.24)
GFR calc non Af Amer: >60 mL/min (ref >60)
Glucose, Bld: 148 mg/dL — ABNORMAL HIGH (ref 65–99)
POTASSIUM: 3.6 mmol/L (ref 3.5–5.1)
SODIUM: 138 mmol/L (ref 135–145)

## 2017-04-12 LAB — CBC
HEMATOCRIT: 42.8 % (ref 39.0–52.0)
Hemoglobin: 14.5 g/dL (ref 13.0–17.0)
MCH: 30.3 pg (ref 26.0–34.0)
MCHC: 33.9 g/dL (ref 30.0–36.0)
MCV: 89.5 fL (ref 78.0–100.0)
PLATELETS: 236 10*3/uL (ref 150–400)
RBC: 4.78 MIL/uL (ref 4.22–5.81)
RDW: 14.3 % (ref 11.5–15.5)
WBC: 15.6 10*3/uL — AB (ref 4.0–10.5)

## 2017-04-12 LAB — I-STAT TROPONIN, ED: Troponin i, poc: 0.01 ng/mL (ref 0.00–0.08)

## 2017-04-12 MED ORDER — MECLIZINE HCL 25 MG PO TABS
25.0000 mg | ORAL_TABLET | Freq: Once | ORAL | Status: AC
  Administered 2017-04-12: 25 mg via ORAL
  Filled 2017-04-12: qty 1

## 2017-04-12 MED ORDER — ONDANSETRON 4 MG PO TBDP
4.0000 mg | ORAL_TABLET | Freq: Once | ORAL | Status: AC
  Administered 2017-04-12: 4 mg via ORAL
  Filled 2017-04-12: qty 1

## 2017-04-12 MED ORDER — FLUTICASONE PROPIONATE 50 MCG/ACT NA SUSP: 1.0000 | Freq: Every day | NASAL | 2 refills | Status: DC

## 2017-04-12 MED ORDER — MECLIZINE HCL 25 MG PO TABS: 25.0000 mg | ORAL_TABLET | Freq: Three times a day (TID) | ORAL | 0 refills | Status: DC | PRN

## 2017-04-12 MED ORDER — PREDNISONE 20 MG PO TABS: ORAL_TABLET | ORAL | 0 refills | Status: DC

## 2017-04-12 MED ORDER — IOPAMIDOL (ISOVUE-370) INJECTION 76%
INTRAVENOUS | Status: AC
  Administered 2017-04-12: 50 mL
  Filled 2017-04-12: qty 50

## 2017-04-12 NOTE — ED Notes (Signed)
Patient transported to MRI 

## 2017-04-12 NOTE — ED Notes (Signed)
Patient returned from MRI, no distress noted.

## 2017-04-12 NOTE — ED Provider Notes (Signed)
Assumed care of patient at shift change.  See prior notes for full H&P.  Briefly, 72 y.o. M here with dizziness.  Symptoms classic for vertigo.  No ofcal neurologic exam findings.  MRI performed given patient's age, with findings of ICA flow void with possible occlusion.  This is felt to be chronic, no findings of acute stroke.    Plan:  Per neuro recommendations, CTA pending given findings of ICA flow void for better characterization.  If chronic appearance, can be d/c home with OP neuro follow-up.  Treatment with flonase, meclizine, and steroids recommended as patient with some congestive symptoms on exam.  Results for orders placed or performed during the hospital encounter of 02/14/30  Basic metabolic panel  Result Value Ref Range   Sodium 138 135 - 145 mmol/L   Potassium 3.6 3.5 - 5.1 mmol/L   Chloride 100 (L) 101 - 111 mmol/L   CO2 25 22 - 32 mmol/L   Glucose, Bld 148 (H) 65 - 99 mg/dL   BUN 24 (H) 6 - 20 mg/dL   Creatinine, Ser 1.13 0.61 - 1.24 mg/dL   Calcium 9.7 8.9 - 10.3 mg/dL   GFR calc non Af Amer >60 >60 mL/min   GFR calc Af Amer >60 >60 mL/min   Anion gap 13 5 - 15  CBC  Result Value Ref Range   WBC 15.6 (H) 4.0 - 10.5 K/uL   RBC 4.78 4.22 - 5.81 MIL/uL   Hemoglobin 14.5 13.0 - 17.0 g/dL   HCT 42.8 39.0 - 52.0 %   MCV 89.5 78.0 - 100.0 fL   MCH 30.3 26.0 - 34.0 pg   MCHC 33.9 30.0 - 36.0 g/dL   RDW 14.3 11.5 - 15.5 %   Platelets 236 150 - 400 K/uL  I-stat troponin, ED  Result Value Ref Range   Troponin i, poc 0.01 0.00 - 0.08 ng/mL   Comment 3           Ct Angio Head W Or Wo Contrast  Result Date: 04/12/2017 CLINICAL DATA:  Dizziness and nausea.  Carotid stenosis. EXAM: CT ANGIOGRAPHY HEAD AND NECK TECHNIQUE: Multidetector CT imaging of the head and neck was performed using the standard protocol during bolus administration of intravenous contrast. Multiplanar CT image reconstructions and MIPs were obtained to evaluate the vascular anatomy. Carotid stenosis  measurements (when applicable) are obtained utilizing NASCET criteria, using the distal internal carotid diameter as the denominator. CONTRAST:  60mL ISOVUE-370 IOPAMIDOL (ISOVUE-370) INJECTION 76% COMPARISON:  None. FINDINGS: CT HEAD FINDINGS Brain: No mass lesion, intraparenchymal hemorrhage or extra-axial collection. No evidence of acute cortical infarct. There is periventricular hypoattenuation compatible with chronic microvascular disease. Vascular: No hyperdense vessel or unexpected vascular calcification. Skull: Normal visualized skull base, calvarium and extracranial soft tissues. Sinuses/Orbits: Bilateral maxillary mucosal thickening. No fluid levels. No mastoid or middle ear effusion. Normal orbits. CTA NECK FINDINGS Aortic arch: There is mild calcific atherosclerosis of the aortic arch. There is no aneurysm, dissection or hemodynamically significant stenosis of the visualized ascending aorta and aortic arch. Conventional 3 vessel aortic branching pattern. The visualized proximal subclavian arteries are widely patent. Right carotid system: The right common carotid origin is widely patent. There is no common carotid or internal carotid artery dissection or aneurysm. Mixed calcified and non-calcified atherosclerotic disease at the bifurcation, extending into the internal carotid artery, resulting in no hemodynamically significant stenosis. Left carotid system: Left common carotid artery origin is patent. No common carotid stenosis. The left internal carotid artery is  occluded at its origin. The entire length remains occluded to the distal cavernous segment where there is slight return of opacification due to collateral flow. Vertebral arteries: The vertebral system is left dominant. Both vertebral artery origins are normal. Both vertebral arteries are normal to their confluence with the basilar artery. Skeleton: There is no bony spinal canal stenosis. No lytic or blastic lesions. Other neck: The nasopharynx  is clear. The oropharynx and hypopharynx are normal. The epiglottis is normal. The supraglottic larynx, glottis and subglottic larynx are normal. No retropharyngeal collection. The parapharyngeal spaces are preserved. The parotid and submandibular glands are normal. No sialolithiasis or salivary ductal dilatation. The thyroid gland is normal. There is no cervical lymphadenopathy. Upper chest: No pneumothorax or pleural effusion. No nodules or masses. Review of the MIP images confirms the above findings CTA HEAD FINDINGS Anterior circulation: --Intracranial internal carotid arteries: As above, the left internal carotid artery is occluded. There is opacification of the carotid terminus secondary to collateral or retrograde flow. There is atherosclerotic calcification of the right internal carotid artery at the skull base without significant stenosis. --Anterior cerebral arteries: Normal. --Middle cerebral arteries: Normal. --Posterior communicating arteries: Present on the right, absent on the left. 1. Posterior circulation: --Posterior cerebral arteries: Moderate-to-severe stenosis of the right P3 segment, but otherwise normal. --Superior cerebellar arteries: Normal. --Basilar artery: Normal. --Anterior inferior cerebellar arteries: Normal. --Posterior inferior cerebellar arteries: Normal. Venous sinuses: As permitted by contrast timing, patent. Anatomic variants: None Delayed phase: No parenchymal contrast enhancement. Review of the MIP images confirms the above findings. IMPRESSION: 1. 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 This confirms the finding of the earlier MRI. 2. Patent intracranial circulation. The left middle cerebral artery is supplied via collateral flow across the anterior communicating artery and bilateral normal A1 segments. 3. Atherosclerotic calcification of the  right carotid bifurcation with less than 50% stenosis. 4.  Aortic Atherosclerosis (ICD10-I70.0). Electronically Signed   By: Ulyses Jarred M.D.   On: 04/12/2017 22:24   Ct Angio Neck W And/or Wo Contrast  Result Date: 04/12/2017 CLINICAL DATA:  Dizziness and nausea.  Carotid stenosis. EXAM: CT ANGIOGRAPHY HEAD AND NECK TECHNIQUE: Multidetector CT imaging of the head and neck was performed using the standard protocol during bolus administration of intravenous contrast. Multiplanar CT image reconstructions and MIPs were obtained to evaluate the vascular anatomy. Carotid stenosis measurements (when applicable) are obtained utilizing NASCET criteria, using the distal internal carotid diameter as the denominator. CONTRAST:  56mL ISOVUE-370 IOPAMIDOL (ISOVUE-370) INJECTION 76% COMPARISON:  None. FINDINGS: CT HEAD FINDINGS Brain: No mass lesion, intraparenchymal hemorrhage or extra-axial collection. No evidence of acute cortical infarct. There is periventricular hypoattenuation compatible with chronic microvascular disease. Vascular: No hyperdense vessel or unexpected vascular calcification. Skull: Normal visualized skull base, calvarium and extracranial soft tissues. Sinuses/Orbits: Bilateral maxillary mucosal thickening. No fluid levels. No mastoid or middle ear effusion. Normal orbits. CTA NECK FINDINGS Aortic arch: There is mild calcific atherosclerosis of the aortic arch. There is no aneurysm, dissection or hemodynamically significant stenosis of the visualized ascending aorta and aortic arch. Conventional 3 vessel aortic branching pattern. The visualized proximal subclavian arteries are widely patent. Right carotid system: The right common carotid origin is widely patent. There is no common carotid or internal carotid artery dissection or aneurysm. Mixed calcified and non-calcified atherosclerotic disease at the bifurcation, extending into the internal carotid artery, resulting  in no hemodynamically significant  stenosis. Left carotid system: Left common carotid artery origin is patent. No common carotid stenosis. The left internal carotid artery is occluded at its origin. The entire length remains occluded to the distal cavernous segment where there is slight return of opacification due to collateral flow. Vertebral arteries: The vertebral system is left dominant. Both vertebral artery origins are normal. Both vertebral arteries are normal to their confluence with the basilar artery. Skeleton: There is no bony spinal canal stenosis. No lytic or blastic lesions. Other neck: The nasopharynx is clear. The oropharynx and hypopharynx are normal. The epiglottis is normal. The supraglottic larynx, glottis and subglottic larynx are normal. No retropharyngeal collection. The parapharyngeal spaces are preserved. The parotid and submandibular glands are normal. No sialolithiasis or salivary ductal dilatation. The thyroid gland is normal. There is no cervical lymphadenopathy. Upper chest: No pneumothorax or pleural effusion. No nodules or masses. Review of the MIP images confirms the above findings CTA HEAD FINDINGS Anterior circulation: --Intracranial internal carotid arteries: As above, the left internal carotid artery is occluded. There is opacification of the carotid terminus secondary to collateral or retrograde flow. There is atherosclerotic calcification of the right internal carotid artery at the skull base without significant stenosis. --Anterior cerebral arteries: Normal. --Middle cerebral arteries: Normal. --Posterior communicating arteries: Present on the right, absent on the left. 1. Posterior circulation: --Posterior cerebral arteries: Moderate-to-severe stenosis of the right P3 segment, but otherwise normal. --Superior cerebellar arteries: Normal. --Basilar artery: Normal. --Anterior inferior cerebellar arteries: Normal. --Posterior inferior cerebellar arteries: Normal. Venous sinuses: As permitted by contrast timing,  patent. Anatomic variants: None Delayed phase: No parenchymal contrast enhancement. Review of the MIP images confirms the above findings. IMPRESSION: 1. 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 This confirms the finding of the earlier MRI. 2. Patent intracranial circulation. The left middle cerebral artery is supplied via collateral flow across the anterior communicating artery and bilateral normal A1 segments. 3. Atherosclerotic calcification of the right carotid bifurcation with less than 50% stenosis. 4.  Aortic Atherosclerosis (ICD10-I70.0). Electronically Signed   By: Ulyses Jarred M.D.   On: 04/12/2017 22:24   Mr Brain Wo Contrast  Result Date: 04/12/2017 CLINICAL DATA:  72 y/o M; sudden onset dizziness with nausea and vomiting. EXAM: MRI HEAD WITHOUT CONTRAST TECHNIQUE: Multiplanar, multiecho pulse sequences of the brain and surrounding structures were obtained without intravenous contrast. COMPARISON:  None. FINDINGS: Brain: No acute infarction, hemorrhage, hydrocephalus, extra-axial collection or mass lesion. Fewnonspecific foci of T2 FLAIR hyperintense signal abnormality in subcortical and periventricular white matter are compatible withmildchronic microvascular ischemic changes for age. Mildbrain parenchymal volume loss. Vascular: Loss of left upper cervical, petrous, and cavernous ICA flow void probably representing occlusion, age indeterminate. Skull and upper cervical spine: Normal marrow signal. Sinuses/Orbits: Mild diffuse paranasal sinus mucosal thickening and maxillary sinus mucous retention cyst. No abnormal signal of mastoid air cells. Orbits are unremarkable. Other: None. IMPRESSION: 1. Loss of left upper cervical, petrous, and cavernous ICA flow void, likely representing occlusion, age indeterminate. 2. No acute intracranial abnormality. No findings of acute or  chronic stroke. 3. Mild chronic microvascular ischemic changes and mild parenchymal volume loss of the brain. 4. Mild paranasal sinus disease. These results were called by telephone at the time of interpretation on 04/12/2017 at 8:10 pm to Dr. Benedetto Goad , who verbally acknowledged these results. Electronically Signed  By: Kristine Garbe M.D.   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  Does not specify acute/chronic.  10:53 PM Re-discussed case with neurohospitalist-- Dr. Carloyn Jaeger who has reviewed scan.  No findings of flow assymmetry and no findings of acute stroke.  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  Will start 81mg  ASA and have patient follow-up with neurology.  Plan discussed with patient and wife, they acknowledged understanding.  Ambulatory referral to neurology placed.  Will treat with flonase, prednisone, and meclizine as per prior team treatment plan.  He understands to follow-up with neurology.  He will return here for any new/acute changes.    Larene Pickett, PA-C 04/12/17 Joie Bimler    Duffy Bruce, MD 04/13/17 [phone]

## 2017-04-12 NOTE — ED Notes (Signed)
Patient Alert and oriented to baseline. Stable and ambulatory to baseline. Patient verbalized understanding of the discharge instructions.  Patient belongings were taken by the patient.   

## 2017-04-12 NOTE — ED Triage Notes (Addendum)
Pt to ED via [facility] with c/o sudden onset of dizziness with nausea and vomiting.  EMS reports when pt turned his head from side to side he would vomit.  EMS gave pt Zofran 4mg  IV

## 2017-04-12 NOTE — ED Provider Notes (Addendum)
[facility] EMERGENCY DEPARTMENT Provider Note   CSN: [id number] Arrival date & time: 04/12/17  1714     History   Chief Complaint Chief Complaint  Patient presents with  . Dizziness  . Emesis    HPI  Adam Gonzales is a 72 y.o. Male with a history of hypertension and gout, who presents to the ED via EMS for evaluation of dizziness and emesis.  She reports around 330 this afternoon he was sitting on his couch watching TV when he had a sudden onset of dizziness which she describes as room spinning sensation with associated nausea, 4 episodes of vomiting and diaphoresis.  No loss of consciousness.  Patient denies any associated vision changes, difficulty speaking or swallowing, weakness, numbness or tingling in any of his extremities.  He reports extreme dizziness and feeling of off balance, to the point that he could not get up and walk anywhere and was crawling around on the floor trying to get to the kitchen or bathroom.  Patient reports since arrival in the ED patient's have dissipated significantly and now he only feels slight lightheadedness and mild nausea, no further episodes of emesis since receiving 4 mg of Zofran via EMS.  Patient denies any history of previous similar episodes of vertigo.  He does report some sensation of pressure and drainage in the right ear over the past few days and had URI symptoms last week.  Patient denies any associated chest pain or shortness of breath.  No abdominal pain.      Past Medical History:  Diagnosis Date  . Gout   . Hypertension     There are no active problems to display for this patient.   Past Surgical History:  Procedure Laterality Date  . CYST EXCISION         Home Medications    Prior to Admission medications   Medication Sig Start Date End Date Taking? Authorizing Provider  doxycycline (VIBRA-TABS) 100 MG tablet Take 1 tablet (100 mg total) by mouth 2 (two) times daily. 07/03/16   Robyn Haber,  MD  spironolactone (ALDACTONE) 25 MG tablet Take 25 mg by mouth daily.    [provider]  Tamsulosin HCl (FLOMAX) 0.4 MG CAPS Take by mouth.    [provider]  telmisartan-hydrochlorothiazide (MICARDIS HCT) 80-25 MG per tablet Take 0.5 tablets by mouth daily.    [provider]  Trandolapril-Verapamil HCl (TARKA PO) Take by mouth.    [provider]    Family History No family history on file.  Social History Social History   Tobacco Use  . Smoking status: Never Smoker  Substance Use Topics  . Alcohol use: No  . Drug use: No     Allergies   Patient has no known allergies.   Review of Systems Review of Systems  Constitutional: Negative for chills and fever.  HENT: Positive for ear discharge and ear pain. Negative for congestion, postnasal drip, rhinorrhea, sinus pressure, sneezing and sore throat.   Respiratory: Negative for cough, chest tightness, shortness of breath and wheezing.   Cardiovascular: Negative for chest pain and palpitations.  Gastrointestinal: Positive for nausea and vomiting. Negative for abdominal pain, blood in stool and diarrhea.  Genitourinary: Negative for dysuria.  Musculoskeletal: Negative for arthralgias, gait problem, neck pain and neck stiffness.  Skin: Negative for color change, rash and wound.  Neurological: Positive for dizziness and light-headedness. Negative for tremors, syncope, facial asymmetry, speech difficulty, weakness, numbness and headaches.  Physical Exam Updated Vital Signs BP (!) 146/75   Pulse 67   Temp 97.7 F (36.5 C) (Oral)   Resp 20   Ht 5\' 10"  (1.778 m)   Wt 86.2 kg (190 lb)   SpO2 97%   BMI 27.26 kg/m   Physical Exam  Constitutional: He appears well-developed and well-nourished. No distress.  HENT:  Head: Normocephalic and atraumatic.  Mouth/Throat: Oropharynx is clear and moist.  Right ear with moderate amount of wax present, clear effusion present behind the ear,  without erythema or signs of infection, left TM normal without evidence of effusion or infection Nasal mucosa with minimal edema, posterior oropharynx clear without erythema, edema or exudates  Eyes: Conjunctivae and EOM are normal. Pupils are equal, round, and reactive to light. Right eye exhibits no discharge. Left eye exhibits no discharge.  No nystagmus  Neck: Normal range of motion. Neck supple.  Cardiovascular: Normal rate, regular rhythm, normal heart sounds and intact distal pulses.  Pulmonary/Chest: Effort normal and breath sounds normal. No stridor. No respiratory distress. He has no wheezes. He has no rales.  Respirations equal and unlabored, patient able to speak in full sentences, lungs clear to auscultation bilaterally  Abdominal: Soft. Bowel sounds are normal. He exhibits no distension and no mass. There is no tenderness. There is no guarding.  Musculoskeletal: He exhibits no edema or deformity.  Neurological: He is alert. Coordination normal.  Neurological Exam:  Mental Status: Alert and oriented to person, place, and time. Attention and concentration normal. Speech clear. Recent memory is intact. Follows commands. Cranial Nerves: Visual fields grossly intact. EOMI and PERRLA. No nystagmus noted. Facial sensation intact at forehead, maxillary cheek, and chin/mandible bilaterally. No facial asymmetry or weakness. Hearing grossly normal. Uvula is midline, and palate elevates symmetrically. Normal SCM and trapezius strength. Tongue midline without fasciculations. Motor: Muscle strength 5/5 in proximal and distal UE and LE bilaterally. No pronator drift. Muscle tone normal. Sensation: Intact to light touch in upper and lower extremities distally bilaterally.  Gait: Normal without ataxia. Coordination: Normal FTN bilaterally.  Skin: Skin is warm and dry. Capillary refill takes less than 2 seconds. He is not diaphoretic.  Psychiatric: He has a normal mood and affect. His behavior is  normal.  Nursing note and vitals reviewed.    ED Treatments / Results  Labs (all labs ordered are listed, but only abnormal results are displayed) Labs Reviewed  BASIC METABOLIC PANEL - Abnormal; Notable for the following components:      Result Value   Chloride 100 (*)    Glucose, Bld 148 (*)    BUN 24 (*)    All other components within normal limits  CBC - Abnormal; Notable for the following components:   WBC 15.6 (*)    All other components within normal limits  I-STAT TROPONIN, ED    EKG  EKG Interpretation  Date/Time:  Thursday April 12 2017 17:19:38 EST Ventricular Rate:  57 PR Interval:    QRS Duration: 181 QT Interval:  465 QTC Calculation: 453 R Axis:   -61 Text Interpretation:  Sinus rhythm RBBB and LAFB Left ventricular hypertrophy No significant change since last tracing Confirmed by Duffy Bruce [phone]) on 04/12/2017 5:32:48 PM       Radiology Mr Brain Wo Contrast  Result Date: 04/12/2017 CLINICAL DATA:  72 y/o M; sudden onset dizziness with nausea and vomiting. EXAM: MRI HEAD WITHOUT CONTRAST TECHNIQUE: Multiplanar, multiecho pulse sequences of the brain and surrounding structures were obtained without intravenous contrast.  COMPARISON:  None. FINDINGS: Brain: No acute infarction, hemorrhage, hydrocephalus, extra-axial collection or mass lesion. Fewnonspecific foci of T2 FLAIR hyperintense signal abnormality in subcortical and periventricular white matter are compatible withmildchronic microvascular ischemic changes for age. Mildbrain parenchymal volume loss. Vascular: Loss of left upper cervical, petrous, and cavernous ICA flow void probably representing occlusion, age indeterminate. Skull and upper cervical spine: Normal marrow signal. Sinuses/Orbits: Mild diffuse paranasal sinus mucosal thickening and maxillary sinus mucous retention cyst. No abnormal signal of mastoid air cells. Orbits are unremarkable. Other: None. IMPRESSION: 1. Loss of left upper cervical,  petrous, and cavernous ICA flow void, likely representing occlusion, age indeterminate. 2. No acute intracranial abnormality. No findings of acute or chronic stroke. 3. Mild chronic microvascular ischemic changes and mild parenchymal volume loss of the brain. 4. Mild paranasal sinus disease. These results were called by telephone at the time of interpretation on 04/12/2017 at 8:10 pm to Dr. Benedetto Goad , who verbally acknowledged these results. Electronically Signed   By: Kristine Garbe M.D.   On: 04/12/2017 20:12    Procedures Procedures (including critical care time)  Medications Ordered in ED Medications  ondansetron (ZOFRAN-ODT) disintegrating tablet 4 mg (not administered)  meclizine (ANTIVERT) tablet 25 mg (25 mg Oral Given 04/12/17 1803)  iopamidol (ISOVUE-370) 76 % injection (50 mLs  Contrast Given 04/12/17 2126)     Initial Impression / Assessment and Plan / ED Course  I have reviewed the triage vital signs and the nursing notes.  Pertinent labs & imaging results that were available during my care of the patient were reviewed by me and considered in my medical decision making (see chart for details).  Resents for evaluation of acute onset of dizziness, nausea and emesis this afternoon.  Symptoms have since resolved and patient is now only complaining of some mild lightheadedness.  He reports he has had some right ear pain w/ URI last week.  Mildly hypertensive but vitals are otherwise normal and patient is overall well-appearing.  Normal neurologic exam, right middle ear effusion noted but no signs of otitis media.  Presentation is very much consistent with peripheral vertigo, patient does have some risk factors for stroke with long-term hypertension and his age, discussed with patient and family, will proceed with MRI to rule out posterior circulation stroke.  We will treat symptoms with meclizine.  Basic labs overall reassuring negative troponin and no concerning EKG changes, no  acute electrolyte derangements requiring intervention, glucose is 148 but patient was not fasting and had eaten [place] toast not long before arrival.  Normal kidney function.  Leukocytosis of 15.9 by feel like this is likely reaction from vomiting and stress on the body, normal hemoglobin.  Patient continues to remain asymptomatic.  MRI shows no acute intracranial abnormality no findings of acute or chronic stroke there are mild chronic microvascular ischemic changes and mild parenchymal volume loss of the brain, there is an incidental finding of ICA flow void likely representing occlusion but it unclear if this is an acute or chronic change.  Will consult neurology to discuss MRI and possible ICA occlusion.  Discussed with Dr. Rory Percy, who reviewed MRI feels this is likely a chronic finding but recommends getting a CTA of the head and neck to help better characterize ICA occlusion.  Discussed with this with the patient and his wife and they are agreeable with getting the CTA.  Patient reports IV contrast often makes him nauseous, requests Zofran.  At shift change care signed out to [facility] who will follow up on results of CTA and touch base with Dr. Rory Percy with neurology.  If CTA without concerning findings, patient can be discharged with vestibular rehab, meclizine, 5-day course of steroids and Flonase for treatment of his peripheral vertigo.  Patient discussed with Dr. Ellender Hose, who saw patient as well and agrees with plan.   Final Clinical Impressions(s) / ED Diagnoses   Final diagnoses:  Vertigo  Left-sided extracranial carotid artery stenosis    ED Discharge Orders    None       Jacqlyn Larsen, PA-C 04/12/17 2302    Jacqlyn Larsen, PA-C 04/12/17 2346    Duffy Bruce, MD 04/13/17 [id number]

## 2017-04-12 NOTE — Discharge Instructions (Signed)
Take the prescribed medication as directed.   Follow-up with neurologist-- I have placed referral and they should contact you within the next few days but if you do not hear from them in a timely manner, please call to make sure you get an appt. Return to the ED for new or worsening symptoms.

## 2017-04-23 ENCOUNTER — Ambulatory Visit: Payer: Medicare Other | Admitting: Neurology

## 2017-04-23 ENCOUNTER — Encounter: Payer: Self-pay | Admitting: Neurology

## 2017-04-23 VITALS — BP 131/77 | HR 62 | Ht 70.0 in | Wt 190.0 lb

## 2017-04-23 DIAGNOSIS — I6522 Occlusion and stenosis of left carotid artery: Secondary | ICD-10-CM

## 2017-04-23 DIAGNOSIS — R42 Dizziness and giddiness: Secondary | ICD-10-CM

## 2017-04-23 HISTORY — DX: Occlusion and stenosis of left carotid artery: I65.22

## 2017-04-23 NOTE — Progress Notes (Signed)
Reason for visit: Vertigo  Referring physician: [place]  Adam Gonzales is a 72 y.o. male  History of present illness:  Adam Gonzales is a 72 year old right-handed white male with a history of hypertension.  The patient went to the emergency room on 12 April 2017 with sudden onset of vertigo.  The patient has been up and active during the day, he was working out and doing physical exercise.  He had sudden onset of vertigo associated with a rocking sensation, he felt nauseated, he was unable to walk and had to crawl to the bathroom.  He had nausea and vomiting.  EMS was called and he was taken to the hospital, he was given a shot for nausea which seemed to help.  The episode of vertigo and nausea lasted about 2-3 hours and then cleared up.  The patient has had at least 2 brief episodes since that time lasting only a few moments, he has been given Antivert to take for the episodes.  The patient has been able to ambulate normally and drive a car.  With the above episode he has not had any headache, vision changes or loss of vision.  He denies double vision, speech changes, difficulty swallowing, or any significant change in hearing.  He does feel somewhat full in the ears, right greater than left.  He reports some mild tinnitus.  He denies any numbness or weakness of the face, arms, or legs.  He has not had any difficulty controlling the bowels or the bladder or difficulty with ambulation at this time.  He reports no syncope.  He went to the emergency room and underwent MRI of the brain that did not show any acute stroke.  There was some suggestion of a left carotid artery occlusion, a CT angiogram was done which confirmed a distal left carotid occlusion.  It was felt that this was chronic in nature.  The patient had a carotid Doppler study in 2008 that suggested carotid stenosis at that time.  The patient apparently never followed up with this finding.  Past Medical History:  Diagnosis Date  . Gout     . Hypertension     Past Surgical History:  Procedure Laterality Date  . CYST EXCISION      No family history on file.  Social history:  reports that  has never smoked. he has never used smokeless tobacco. He reports that he does not drink alcohol or use drugs.  Medications:  Prior to Admission medications   Medication Sig Start Date End Date Taking? Authorizing Provider  aspirin EC 81 MG tablet Take 81 mg by mouth daily.   Yes [provider]  finasteride (PROSCAR) 5 MG tablet Take 5 mg by mouth daily. 03/12/17  Yes [provider]  fluticasone (FLONASE) 50 MCG/ACT nasal spray Place 1 spray into both nostrils daily. 04/12/17  Yes Larene Pickett, PA-C  hydrochlorothiazide (HYDRODIURIL) 12.5 MG tablet Take 12.5 mg by mouth daily. 03/08/17  Yes [provider]  lisinopril (PRINIVIL,ZESTRIL) 40 MG tablet Take 40 mg by mouth daily. 02/15/17  Yes [provider]  meclizine (ANTIVERT) 25 MG tablet Take 1 tablet (25 mg total) by mouth 3 (three) times daily as needed for dizziness. 04/12/17  Yes Larene Pickett, PA-C  spironolactone (ALDACTONE) 25 MG tablet Take 25 mg by mouth daily.   Yes [provider]  Tamsulosin HCl (FLOMAX) 0.4 MG CAPS Take by mouth.   Yes [provider]  telmisartan (MICARDIS)  40 MG tablet Take 40 mg by mouth daily. 03/07/17  Yes [provider]     No Known Allergies  ROS:  Out of a complete 14 system review of symptoms, the patient complains only of the following symptoms, and all other reviewed systems are negative.  Vertigo  Blood pressure 131/77, pulse 62, height 5\' 10"  (1.778 m), weight 190 lb (86.2 kg).  Physical Exam  General: The patient is alert and cooperative at the time of the examination.  Eyes: Pupils are equal, round, and reactive to light. Discs are flat bilaterally.  Ears: Tympanic membrane is clear on the left, cannot fully visualized on the right due to cerumen, there is no cerumen  plug.  Neck: The neck is supple, no carotid bruits are noted.  Respiratory: The respiratory examination is clear.  Cardiovascular: The cardiovascular examination reveals a regular rate and rhythm, no obvious murmurs or rubs are noted.  Skin: Extremities are without significant edema.  Neurologic Exam  Mental status: The patient is alert and oriented x 3 at the time of the examination. The patient has apparent normal recent and remote memory, with an apparently normal attention span and concentration ability.  Cranial nerves: Facial symmetry is present. There is good sensation of the face to pinprick and soft touch bilaterally. The strength of the facial muscles and the muscles to head turning and shoulder shrug are normal bilaterally. Speech is well enunciated, no aphasia or dysarthria is noted. Extraocular movements are full. Visual fields are full. The tongue is midline, and the patient has symmetric elevation of the soft palate. No obvious hearing deficits are noted.  Motor: The motor testing reveals 5 over 5 strength of all 4 extremities. Good symmetric motor tone is noted throughout.  Sensory: Sensory testing is intact to pinprick, soft touch, vibration sensation, and position sense on all 4 extremities. No evidence of extinction is noted.  Coordination: Cerebellar testing reveals good finger-nose-finger and heel-to-shin bilaterally.  Gait and station: Gait is normal. Tandem gait is normal. Romberg is negative. No drift is seen.  Reflexes: Deep tendon reflexes are symmetric and normal bilaterally. Toes are downgoing bilaterally.    MRI brain 04/12/17:  IMPRESSION: 1. Loss of left upper cervical, petrous, and cavernous ICA flow void, likely representing occlusion, age indeterminate. 2. No acute intracranial abnormality. No findings of acute or chronic stroke. 3. Mild chronic microvascular ischemic changes and mild parenchymal volume loss of the brain. 4. Mild paranasal sinus  disease.  * MRI scan images were reviewed online. I agree with the written report.   CTA head and neck 04/12/17:  IMPRESSION: 1. 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 This confirms the finding of the earlier MRI. 2. Patent intracranial circulation. The left middle cerebral artery is supplied via collateral flow across the anterior communicating artery and bilateral normal A1 segments. 3. Atherosclerotic calcification of the right carotid bifurcation with less than 50% stenosis. 4.  Aortic Atherosclerosis (ICD10-I70.0).   Assessment/Plan:  1.  Vertigo  2.  Left distal internal carotid artery occlusion  The patient is on low-dose aspirin at this time.  The vertigo is probably not related to the carotid vessel occlusion.  The patient has good collateral circulation by CT angiogram.  At this point, the patient will take Antivert if needed, he will follow-up through this office on as-needed basis. If the episodes of vertigo recur,  he is to contact our office. If hearing changes are noted with the vertigo, Menire's disease should be considered.  Jill Alexanders MD 04/23/2017 7:30 AM  [facility] [address] [place], [place] [address]  Phone [phone] Fax [phone]

## 2017-04-26 DIAGNOSIS — H6120 Impacted cerumen, unspecified ear: Secondary | ICD-10-CM | POA: Diagnosis not present

## 2017-04-26 DIAGNOSIS — H8149 Vertigo of central origin, unspecified ear: Secondary | ICD-10-CM | POA: Diagnosis not present

## 2017-04-26 DIAGNOSIS — H8123 Vestibular neuronitis, bilateral: Secondary | ICD-10-CM | POA: Diagnosis not present

## 2017-04-27 DIAGNOSIS — I159 Secondary hypertension, unspecified: Secondary | ICD-10-CM | POA: Diagnosis not present

## 2017-07-16 DIAGNOSIS — N179 Acute kidney failure, unspecified: Secondary | ICD-10-CM | POA: Diagnosis not present

## 2017-07-16 DIAGNOSIS — I159 Secondary hypertension, unspecified: Secondary | ICD-10-CM | POA: Diagnosis not present

## 2017-07-16 DIAGNOSIS — N182 Chronic kidney disease, stage 2 (mild): Secondary | ICD-10-CM | POA: Diagnosis not present

## 2017-07-16 DIAGNOSIS — R7309 Other abnormal glucose: Secondary | ICD-10-CM | POA: Diagnosis not present

## 2017-07-18 DIAGNOSIS — R972 Elevated prostate specific antigen [PSA]: Secondary | ICD-10-CM | POA: Diagnosis not present

## 2017-07-30 DIAGNOSIS — R972 Elevated prostate specific antigen [PSA]: Secondary | ICD-10-CM | POA: Diagnosis not present

## 2017-07-30 DIAGNOSIS — N5201 Erectile dysfunction due to arterial insufficiency: Secondary | ICD-10-CM | POA: Diagnosis not present

## 2017-08-03 DIAGNOSIS — I10 Essential (primary) hypertension: Secondary | ICD-10-CM | POA: Diagnosis not present

## 2017-08-03 DIAGNOSIS — N401 Enlarged prostate with lower urinary tract symptoms: Secondary | ICD-10-CM | POA: Diagnosis not present

## 2017-10-29 DIAGNOSIS — R972 Elevated prostate specific antigen [PSA]: Secondary | ICD-10-CM | POA: Diagnosis not present

## 2018-01-01 ENCOUNTER — Inpatient Hospital Stay (HOSPITAL_COMMUNITY)
Admission: EM | Admit: 2018-01-01 | Discharge: 2018-01-03 | DRG: 554 | Disposition: A | Discharge status: Home / Self Care | Payer: Medicare Other | Source: Ambulatory Visit | Attending: Internal Medicine | Admitting: Internal Medicine

## 2018-01-01 ENCOUNTER — Other Ambulatory Visit (HOSPITAL_COMMUNITY): Payer: Self-pay | Admitting: Lab

## 2018-01-01 DIAGNOSIS — I10 Essential (primary) hypertension: Secondary | ICD-10-CM

## 2018-01-01 DIAGNOSIS — M109 Gout, unspecified: Principal | ICD-10-CM | POA: Diagnosis present

## 2018-01-01 DIAGNOSIS — D72829 Elevated white blood cell count, unspecified: Secondary | ICD-10-CM | POA: Diagnosis present

## 2018-01-01 DIAGNOSIS — Z7982 Long term (current) use of aspirin: Secondary | ICD-10-CM | POA: Diagnosis not present

## 2018-01-01 DIAGNOSIS — Z8249 Family history of ischemic heart disease and other diseases of the circulatory system: Secondary | ICD-10-CM | POA: Diagnosis not present

## 2018-01-01 DIAGNOSIS — M1310 Monoarthritis, not elsewhere classified, unspecified site: Secondary | ICD-10-CM | POA: Diagnosis present

## 2018-01-01 DIAGNOSIS — N179 Acute kidney failure, unspecified: Secondary | ICD-10-CM | POA: Diagnosis present

## 2018-01-01 DIAGNOSIS — Z79899 Other long term (current) drug therapy: Secondary | ICD-10-CM | POA: Diagnosis not present

## 2018-01-01 DIAGNOSIS — M119 Crystal arthropathy, unspecified: Secondary | ICD-10-CM | POA: Diagnosis not present

## 2018-01-01 DIAGNOSIS — Z808 Family history of malignant neoplasm of other organs or systems: Secondary | ICD-10-CM | POA: Diagnosis not present

## 2018-01-01 DIAGNOSIS — E269 Hyperaldosteronism, unspecified: Secondary | ICD-10-CM | POA: Diagnosis not present

## 2018-01-01 DIAGNOSIS — I129 Hypertensive chronic kidney disease with stage 1 through stage 4 chronic kidney disease, or unspecified chronic kidney disease: Secondary | ICD-10-CM | POA: Diagnosis present

## 2018-01-01 DIAGNOSIS — Z800 Family history of malignant neoplasm of digestive organs: Secondary | ICD-10-CM | POA: Diagnosis not present

## 2018-01-01 DIAGNOSIS — I6522 Occlusion and stenosis of left carotid artery: Secondary | ICD-10-CM | POA: Diagnosis not present

## 2018-01-01 DIAGNOSIS — N400 Enlarged prostate without lower urinary tract symptoms: Secondary | ICD-10-CM | POA: Diagnosis present

## 2018-01-01 DIAGNOSIS — N182 Chronic kidney disease, stage 2 (mild): Secondary | ICD-10-CM | POA: Diagnosis not present

## 2018-01-01 DIAGNOSIS — M13122 Monoarthritis, not elsewhere classified, left elbow: Secondary | ICD-10-CM | POA: Diagnosis present

## 2018-01-01 DIAGNOSIS — M25522 Pain in left elbow: Secondary | ICD-10-CM | POA: Diagnosis present

## 2018-01-01 DIAGNOSIS — Z8781 Personal history of (healed) traumatic fracture: Secondary | ICD-10-CM | POA: Diagnosis not present

## 2018-01-01 DIAGNOSIS — M01X22 Direct infection of left elbow in infectious and parasitic diseases classified elsewhere: Secondary | ICD-10-CM | POA: Diagnosis not present

## 2018-01-01 LAB — CBC
HEMATOCRIT: 41.6 % (ref 39.0–52.0)
Hemoglobin: 13.4 g/dL (ref 13.0–17.0)
MCH: 29.2 pg (ref 26.0–34.0)
MCHC: 32.2 g/dL (ref 30.0–36.0)
MCV: 90.6 fL (ref 80.0–100.0)
Platelets: 271 10*3/uL (ref 150–400)
RBC: 4.59 MIL/uL (ref 4.22–5.81)
RDW: 13.9 % (ref 11.5–15.5)
WBC: 11.2 10*3/uL — ABNORMAL HIGH (ref 4.0–10.5)
nRBC: 0 % (ref 0.0–0.2)

## 2018-01-01 LAB — BASIC METABOLIC PANEL
ANION GAP: 8 (ref 5–15)
BUN: 27 mg/dL — ABNORMAL HIGH (ref 8–23)
CALCIUM: 9.4 mg/dL (ref 8.9–10.3)
CO2: 24 mmol/L (ref 22–32)
CREATININE: 1.44 mg/dL — AB (ref 0.61–1.24)
Chloride: 107 mmol/L (ref 98–111)
GFR calc Af Amer: 57 mL/min — ABNORMAL LOW (ref >60)
GFR calc non Af Amer: 49 mL/min — ABNORMAL LOW (ref >60)
GLUCOSE: 144 mg/dL — AB (ref 70–99)
Potassium: 3.8 mmol/L (ref 3.5–5.1)
Sodium: 139 mmol/L (ref 135–145)

## 2018-01-01 LAB — SYNOVIAL CELL COUNT + DIFF, W/ CRYSTALS
EOSINOPHILS-SYNOVIAL: 0 % (ref 0–1)
Lymphocytes-Synovial Fld: 1 % (ref 0–20)
MONOCYTE-MACROPHAGE-SYNOVIAL FLUID: 11 % — AB (ref 50–90)
NEUTROPHIL, SYNOVIAL: 88 % — AB (ref 0–25)
WBC, Synovial: 46500 /mm3 — ABNORMAL HIGH (ref 0–200)

## 2018-01-01 MED ORDER — HYDROCHLOROTHIAZIDE 25 MG PO TABS: 12.5000 mg | ORAL_TABLET | Freq: Every day | ORAL | Status: DC

## 2018-01-01 MED ORDER — INDOMETHACIN 25 MG PO CAPS
50.0000 mg | ORAL_CAPSULE | Freq: Three times a day (TID) | ORAL | Status: DC
  Administered 2018-01-01 – 2018-01-02 (×3): 50 mg via ORAL
  Filled 2018-01-01 (×4): qty 2

## 2018-01-01 MED ORDER — ENOXAPARIN SODIUM 40 MG/0.4ML ~~LOC~~ SOLN
40.0000 mg | SUBCUTANEOUS | Status: DC
  Administered 2018-01-01 – 2018-01-02 (×2): 40 mg via SUBCUTANEOUS
  Filled 2018-01-01 (×2): qty 0.4

## 2018-01-01 MED ORDER — LISINOPRIL 40 MG PO TABS
40.0000 mg | ORAL_TABLET | Freq: Every day | ORAL | Status: DC
  Administered 2018-01-02 – 2018-01-03 (×2): 40 mg via ORAL
  Filled 2018-01-01 (×2): qty 1

## 2018-01-01 MED ORDER — ASPIRIN EC 81 MG PO TBEC
81.0000 mg | DELAYED_RELEASE_TABLET | Freq: Every day | ORAL | Status: DC
  Administered 2018-01-02 – 2018-01-03 (×2): 81 mg via ORAL
  Filled 2018-01-01 (×2): qty 1

## 2018-01-01 MED ORDER — FINASTERIDE 5 MG PO TABS
5.0000 mg | ORAL_TABLET | Freq: Every day | ORAL | Status: DC
  Administered 2018-01-02 – 2018-01-03 (×2): 5 mg via ORAL
  Filled 2018-01-01 (×2): qty 1

## 2018-01-01 MED ORDER — SODIUM CHLORIDE 0.9% FLUSH
3.0000 mL | Freq: Two times a day (BID) | INTRAVENOUS | Status: DC
  Administered 2018-01-01 – 2018-01-03 (×3): 3 mL via INTRAVENOUS

## 2018-01-01 MED ORDER — TAMSULOSIN HCL 0.4 MG PO CAPS
0.4000 mg | ORAL_CAPSULE | Freq: Every day | ORAL | Status: DC
  Administered 2018-01-02 – 2018-01-03 (×2): 0.4 mg via ORAL
  Filled 2018-01-01 (×2): qty 1

## 2018-01-01 MED ORDER — SPIRONOLACTONE 25 MG PO TABS
25.0000 mg | ORAL_TABLET | Freq: Every day | ORAL | Status: DC
  Administered 2018-01-02 – 2018-01-03 (×2): 25 mg via ORAL
  Filled 2018-01-01 (×2): qty 1

## 2018-01-01 NOTE — H&P (Addendum)
Date: 01/01/2018               Patient Name:  Adam Gonzales MRN: [id number]  DOB: Nov 16, 1945 Age / Sex: 72 y.o., male   PCP: Levin Erp, MD         Medical Service: Internal Medicine Teaching Service         Attending Physician: Dr. Annia Belt, MD    First Contact: Dr. Donne Hazel Pager: [phone]  Second Contact: Dr. Frederico Hamman Pager: [phone]       After Hours (After 5p/  First Contact Pager: [phone]  weekends / holidays): Second Contact Pager: [phone]   Chief Complaint: Elbow pain  History of Present Illness: Mr. Chappuis is a 72 y.o male with a PMHx of crystal arthropathy, BPH, HTN and left carotid occlusion presenting with elbow pain. His symptoms started this past Friday when he started having an "unusual feeling" in his left elbow. He reported it was difficult to move and his symptoms progressed through the weekend. 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 On Monday he started having excruciating pain that he said was as severe as a 10/10 and a 3-4/10 with ibuprofen. Overnight he developed swelling in his elbow that extended all the way down to his hand with worsening pain. He went to see his PCP who performed an arthrocentesis and recommended he come to the hospital. He denies any fevers, chills, nausea, vomiting or other joint pain. He said in the past his right big toe was swollen for a few days and then resolved on its own. He also had swelling in his right 3rd finger in the past and he was told he had pseudogout at that time. He denies any family history of gout, any alcohol or drug use, changes in diet or medications.   Synovial fluid analysis showed yellow color, turbid appearance, intracellular monosodium urate crystals, WBC 46,500, neutrophils 88%. Body fluid synovial culture showed moderate WBC present, predominantly PMN, no organisms seen.  Meds:  Current Meds  Medication Sig  . aspirin EC 81 MG tablet  Take 81 mg by mouth daily.  . finasteride (PROSCAR) 5 MG tablet Take 5 mg by mouth daily.  . fluticasone (FLONASE) 50 MCG/ACT nasal spray Place 1 spray into both nostrils daily.  . hydrochlorothiazide (HYDRODIURIL) 12.5 MG tablet Take 12.5 mg by mouth daily.  Marland Kitchen lisinopril (PRINIVIL,ZESTRIL) 40 MG tablet Take 40 mg by mouth daily.  Marland Kitchen losartan (COZAAR) 25 MG tablet Take 25 mg by mouth 2 (two) times daily.  Marland Kitchen spironolactone (ALDACTONE) 25 MG tablet Take 25 mg by mouth daily.  . Tamsulosin HCl (FLOMAX) 0.4 MG CAPS Take by mouth.     Allergies: Allergies as of 01/01/2018  . (No Known Allergies)   Past Medical History:  Diagnosis Date  . 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 Gout   . Hypertension     Family History:  Family History  Problem Relation Age of Onset  . Heart failure Mother   . Cancer - Colon Father   . Dementia Sister   . Cancer Brother        throat   Social History:  Works as an Programmer, applications. Reports that  has never smoked or used smokeless tobacco. He reports that he does not drink alcohol or use drugs. No changes in diet or medications.   Social History   Socioeconomic History  . Marital status: Married  Spouse name: Not on file  . Number of children: Not on file  . Years of education: Not on file  . Highest education level: Not on file  Occupational History  . Not on file  Social Needs  . Financial resource strain: Not on file  . Food insecurity:    Worry: Not on file    Inability: Not on file  . Transportation needs:    Medical: Not on file    Non-medical: Not on file  Tobacco Use  . Smoking status: Never Smoker  . Smokeless tobacco: Never Used  Substance and Sexual Activity  . Alcohol use: No  . Drug use: No  . Sexual activity: Not on file  Lifestyle  . Physical activity:    Days per week: Not on file    Minutes per session: Not on file  . Stress: Not on file  Relationships  . Social connections:    Talks on phone: Not on file     Gets together: Not on file    Attends religious service: Not on file    Active member of club or organization: Not on file    Attends meetings of clubs or organizations: Not on file    Relationship status: Not on file  . Intimate partner violence:    Fear of current or ex partner: Not on file    Emotionally abused: Not on file    Physically abused: Not on file    Forced sexual activity: Not on file  Other Topics Concern  . Not on file  Social History Narrative  . Not on file    Review of Systems: A complete ROS was negative except as per HPI.   Physical Exam: Blood pressure (!) 162/82, pulse 97, temperature 98.2 F (36.8 C), temperature source Oral, SpO2 96 %.  Physical Exam  Constitutional: He is oriented to person, place, and time and well-developed, well-nourished, and in no distress.  Eyes: Conjunctivae are normal. Right eye exhibits no discharge. Left eye exhibits no discharge.  Cardiovascular: Normal rate, regular rhythm, normal heart sounds and intact distal pulses.  No murmur heard. Pulmonary/Chest: Effort normal and breath sounds normal. No respiratory distress. He has no wheezes. He has no rales.  Abdominal: Soft. Bowel sounds are normal. He exhibits no distension.  Musculoskeletal: He exhibits no edema or tenderness.  Left elbow swelling and erythema   Neurological: He is alert and oriented to person, place, and time.  Skin: Skin is warm and dry.  Psychiatric: Memory, affect and judgment normal.    Assessment & Plan by Problem: Active Problems:   Acute monoarthritis  Mr. Warehime is a 71 y.o male with a PMHx of questionable crystal arthropathy, BPH, HTN and left carotid occlusion presenting with elbow pain. Synovial fluid analysis shows intracellular monosodium urate crystals.    Gout Patient presented with worsening left elbow pain and swelling for the past couple of days, with some relief with ibuprofen. History of other joint flare ups concerning for gout in  the past. Synovial fluid analysis showed yellow color, turbid appearance, intracellular monosodium urate crystals, WBC 46,500, neutrophils 88%. Body fluid synovial culture showed moderate WBC present, predominantly PMN, no organisms seen.  He was sent in by his PCP to rule out septic arthritis vs gout. No s/sx of infection, vitals stable, will continue to monitor. Will start indomethacin and discontinue HCTZ. -start indomethacin 50 mg TID  -f/u cbc and bmp -f/u synovial fluid culture  AKI Cr 1.44 on admission, 1.13 8  months ago. Unclear etiology, patient has only taken 1-2 doses a day of ibuprofen. Possibly due to increased uric acid from gout flare. Continue to monitor. -f/u bmp -f/u uric acid level  HTN -home medications include HCTZ 12.5 mg qd, lisinopril 40 mg qd and spironolactone 25 mg qd  -discontinue HCTZ 12.5 mg   Left carotid occlusion -continue aspirin 81 mg   BPH -home medications tamsulosin 0.4 mg qd, finasteride 5 mg qd    Diet: regular DVT Prophylaxis: Lovenox Full code  Dispo: Admit patient to Observation with expected length of stay less than 2 midnights.  SignedMike Craze, DO 01/01/2018, 9:39 PM  Pager: [phone]

## 2018-01-01 NOTE — Progress Notes (Addendum)
Received patient as direct admit for pain in elbow. Patient complains of pain 5/10 with activity. A&O x4, ambulates to bed. Wife at bedside. Will wait on physician to come to room and see patient.  [phone] paged [facility] that patient arrived on the unit

## 2018-01-02 ENCOUNTER — Encounter (HOSPITAL_COMMUNITY): Payer: Self-pay | Admitting: General Practice

## 2018-01-02 ENCOUNTER — Other Ambulatory Visit: Payer: Self-pay

## 2018-01-02 DIAGNOSIS — Z800 Family history of malignant neoplasm of digestive organs: Secondary | ICD-10-CM | POA: Diagnosis not present

## 2018-01-02 DIAGNOSIS — I6522 Occlusion and stenosis of left carotid artery: Secondary | ICD-10-CM

## 2018-01-02 DIAGNOSIS — Z7982 Long term (current) use of aspirin: Secondary | ICD-10-CM

## 2018-01-02 DIAGNOSIS — I10 Essential (primary) hypertension: Secondary | ICD-10-CM

## 2018-01-02 DIAGNOSIS — M13122 Monoarthritis, not elsewhere classified, left elbow: Secondary | ICD-10-CM

## 2018-01-02 DIAGNOSIS — Z79899 Other long term (current) drug therapy: Secondary | ICD-10-CM

## 2018-01-02 DIAGNOSIS — N182 Chronic kidney disease, stage 2 (mild): Secondary | ICD-10-CM

## 2018-01-02 DIAGNOSIS — D72829 Elevated white blood cell count, unspecified: Secondary | ICD-10-CM | POA: Diagnosis present

## 2018-01-02 DIAGNOSIS — M109 Gout, unspecified: Secondary | ICD-10-CM | POA: Diagnosis present

## 2018-01-02 DIAGNOSIS — N179 Acute kidney failure, unspecified: Secondary | ICD-10-CM | POA: Diagnosis present

## 2018-01-02 DIAGNOSIS — I129 Hypertensive chronic kidney disease with stage 1 through stage 4 chronic kidney disease, or unspecified chronic kidney disease: Secondary | ICD-10-CM | POA: Diagnosis present

## 2018-01-02 DIAGNOSIS — N400 Enlarged prostate without lower urinary tract symptoms: Secondary | ICD-10-CM

## 2018-01-02 DIAGNOSIS — E269 Hyperaldosteronism, unspecified: Secondary | ICD-10-CM | POA: Diagnosis present

## 2018-01-02 DIAGNOSIS — Z808 Family history of malignant neoplasm of other organs or systems: Secondary | ICD-10-CM | POA: Diagnosis not present

## 2018-01-02 DIAGNOSIS — M25522 Pain in left elbow: Secondary | ICD-10-CM | POA: Diagnosis present

## 2018-01-02 DIAGNOSIS — Z8781 Personal history of (healed) traumatic fracture: Secondary | ICD-10-CM

## 2018-01-02 DIAGNOSIS — Z8249 Family history of ischemic heart disease and other diseases of the circulatory system: Secondary | ICD-10-CM | POA: Diagnosis not present

## 2018-01-02 DIAGNOSIS — M119 Crystal arthropathy, unspecified: Secondary | ICD-10-CM

## 2018-01-02 LAB — CBC
HCT: 42.2 % (ref 39.0–52.0)
HEMOGLOBIN: 13.9 g/dL (ref 13.0–17.0)
MCH: 29.5 pg (ref 26.0–34.0)
MCHC: 32.9 g/dL (ref 30.0–36.0)
MCV: 89.6 fL (ref 80.0–100.0)
NRBC: 0 % (ref 0.0–0.2)
Platelets: 264 10*3/uL (ref 150–400)
RBC: 4.71 MIL/uL (ref 4.22–5.81)
RDW: 13.5 % (ref 11.5–15.5)
WBC: 9.9 10*3/uL (ref 4.0–10.5)

## 2018-01-02 LAB — BASIC METABOLIC PANEL
Anion gap: 10 (ref 5–15)
BUN: 25 mg/dL — ABNORMAL HIGH (ref 8–23)
CHLORIDE: 106 mmol/L (ref 98–111)
CO2: 22 mmol/L (ref 22–32)
Calcium: 9.3 mg/dL (ref 8.9–10.3)
Creatinine, Ser: 1.28 mg/dL — ABNORMAL HIGH (ref 0.61–1.24)
GFR calc non Af Amer: 56 mL/min — ABNORMAL LOW (ref >60)
Glucose, Bld: 156 mg/dL — ABNORMAL HIGH (ref 70–99)
POTASSIUM: 3.8 mmol/L (ref 3.5–5.1)
SODIUM: 138 mmol/L (ref 135–145)

## 2018-01-02 LAB — URINALYSIS, ROUTINE W REFLEX MICROSCOPIC
Bilirubin Urine: NEGATIVE
GLUCOSE, UA: NEGATIVE mg/dL
Ketones, ur: NEGATIVE mg/dL
Nitrite: NEGATIVE
PROTEIN: NEGATIVE mg/dL
RBC / HPF: >50 RBC/hpf — ABNORMAL HIGH (ref 0–5)
Specific Gravity, Urine: 1.018 (ref 1.005–1.030)
pH: 5 (ref 5.0–8.0)

## 2018-01-02 LAB — HIV ANTIBODY (ROUTINE TESTING W REFLEX): HIV SCREEN 4TH GENERATION: NONREACTIVE

## 2018-01-02 LAB — URIC ACID: URIC ACID, SERUM: 7.9 mg/dL (ref 3.7–8.6)

## 2018-01-02 MED ORDER — VANCOMYCIN HCL 10 G IV SOLR
1500.0000 mg | Freq: Once | INTRAVENOUS | Status: AC
  Administered 2018-01-02: 1500 mg via INTRAVENOUS
  Filled 2018-01-02: qty 1500

## 2018-01-02 MED ORDER — VANCOMYCIN HCL IN DEXTROSE 750-5 MG/150ML-% IV SOLN
750.0000 mg | Freq: Two times a day (BID) | INTRAVENOUS | Status: DC
  Administered 2018-01-03: 750 mg via INTRAVENOUS
  Filled 2018-01-02 (×2): qty 150

## 2018-01-02 NOTE — Progress Notes (Signed)
Pharmacy Antibiotic Note  Adam Gonzales is a 72 y.o. male admitted on 01/01/2018 with pain, swelling, decreased mobility in elbow.  Pharmacy has been consulted for vancomycin dosing for possible septic arthritis; differential includes acute gout as well. He had an arthrocentesis done and culture is ngtd. He is afebrile, WBC are down to normal. SCr has trended down to 1.28, est CrCl ~59 ml/min.  Plan: Vancomycin 1500 mg IV once then 750 mg IV q12h, goal 15-20 mcg/ml Monitor renal function, clinical progress, and LOT Trough as clinically indicated     Temp (24hrs), Avg:98 F (36.7 C), Min:97.9 F (36.6 C), Max:98.2 F (36.8 C)  Recent Labs  Lab 01/01/18 2242 01/02/18 0545  WBC 11.2* 9.9  CREATININE 1.44* 1.28*    CrCl cannot be calculated (Unknown ideal weight.).   86 kg 04/2017 72"  No Known Allergies  Antimicrobials this admission: vancomycin 11/27 >>   Dose adjustments this admission:   Microbiology results: 11/26 synovial fluid:  Thank you for allowing pharmacy to be a part of this patient's care.  Renold Genta, PharmD, BCPS Clinical Pharmacist Clinical phone for 01/02/2018 until 3p is [phone] 01/02/2018 1:38 PM  **Pharmacist phone directory can now be found on [email or web address] listed under [place]**

## 2018-01-02 NOTE — Plan of Care (Signed)
  Problem: Safety: Goal: Ability to remain free from injury will improve Outcome: Progressing   Problem: Elimination: Goal: Will not experience complications related to urinary retention Outcome: Progressing

## 2018-01-02 NOTE — Progress Notes (Signed)
   Subjective: NAEON. Reports improvement in left arm/elbow pain but it has not completely resolved and ROM is still limited. He denies fevers, chills, n/v/d.   Objective:  Vital signs in last 24 hours: Vitals:   01/01/18 2040 01/02/18 0544  BP: (!) 162/82 131/70  Pulse: 97 66  Temp: 98.2 F (36.8 C) 97.9 F (36.6 C)  TempSrc: Oral Oral  SpO2: 96% 97%    Gen: laying comfortably in bed, NAD Cardiac: RRR PUlm: CTAB MSK: left elbow red, swollen, slightly warm. Minimal tenderness to palpation. Painful ROM. No swelling in the forearm or hand.  Abd: soft, non distended, active BS  Assessment/Plan:  Active Problems:   Acute monoarthritis   CKD (chronic kidney disease) stage 2, GFR 60-89 ml/min  1. Acute monoarthritis: Arthrocentesis revealed elevated WBC, monosodium urate crystals, and gram stain was negative for organism, but could be a false negative. Synovial fluid culture pending. No fevers overnight. Slight leukocytosis on admission has resolved. Left elbow/hand swelling is improving. Uric acid 7.9. Cannot rule out septic arthritis until cultures come back negative. Start IV vancomycin while awaiting culture results. Discontinue indomethacin.   2. CKD II: baseline creatine 1.1, slightly elevated to 1.4 on admission but improved with IVFs. Continue to monitor for renal toxicity while on vancomycin. Avoid other nephrotoxic medicines.   3. HTN: Holding home HCTZ in the setting of probable gout flare. Continue lisinopril, losartan, spironolactone   Dispo: Anticipated discharge in approximately 2 day(s).   Isabelle Course, MD 01/02/2018, 12:12 PM Pager: [phone]

## 2018-01-03 DIAGNOSIS — E269 Hyperaldosteronism, unspecified: Secondary | ICD-10-CM

## 2018-01-03 DIAGNOSIS — M109 Gout, unspecified: Principal | ICD-10-CM

## 2018-01-03 LAB — BASIC METABOLIC PANEL
ANION GAP: 5 (ref 5–15)
BUN: 19 mg/dL (ref 8–23)
CALCIUM: 8.7 mg/dL — AB (ref 8.9–10.3)
CO2: 23 mmol/L (ref 22–32)
Chloride: 109 mmol/L (ref 98–111)
Creatinine, Ser: 1.28 mg/dL — ABNORMAL HIGH (ref 0.61–1.24)
GFR, EST NON AFRICAN AMERICAN: 56 mL/min — AB (ref >60)
GLUCOSE: 137 mg/dL — AB (ref 70–99)
Potassium: 3.8 mmol/L (ref 3.5–5.1)
SODIUM: 137 mmol/L (ref 135–145)

## 2018-01-03 NOTE — Progress Notes (Signed)
   Subjective: No acute events overnight. Still notes some residual decreased and painful range of motion, but this is much improved from prior.  Denies chills, diarrhea, dysuria.  Objective:  Vital signs in last 24 hours: Vitals:   01/02/18 0544 01/02/18 1312 01/02/18 2008 01/03/18 0526  BP: 131/70 138/70 136/76 126/74  Pulse: 66 (!) 59 63 60  Resp:  18 16 16   Temp: 97.9 F (36.6 C) 97.9 F (36.6 C)  98.1 F (36.7 C)  TempSrc: Oral Oral  Oral  SpO2: 97% 96% 96% 100%   Gen: sitting up in bed, NAD Cards: RRR, no m/r/g MSK: left elbow swelling has resolved, no redness or warmth. Improved ROM with minimal pain   Assessment/Plan:  Active Problems:   Acute monoarthritis   CKD (chronic kidney disease) stage 2, GFR 60-89 ml/min   HTN (hypertension)  1.  Acute monoarthritis: Significant clinical improvement overnight with decreased swelling of the left elbow and improve range of motion.  Afebrile.  Received 2 doses of IV vancomycin.  Synovial fluid cultures are no growth at 2 days.  Spoke with Dr. Nyoka Cowden over the phone and we feel that given his clinical improvement and current negative culture results, patient is safe to discharge.  Dr. Nyoka Cowden is on-call and available if the patient has any questions after discharge.  Recommend discontinuing hydrochlorothiazide due to evidence of increased gout flares in patients on this medication.  2.  CKD stage II: Stable  3.  Hypertension: Remains at goal. Holding home HCTZ in the setting of probable gout flare.  Continue lisinopril, losartan, spironolactone.  Patient is taking an ACE-i and an ARB due to his hyperaldosteronism.  This regiment was started by his outpatient nephrologist.  Dispo: Anticipated discharge today.  Isabelle Course, MD 01/03/2018, 11:02 AM Pager: [phone]

## 2018-01-03 NOTE — Discharge Summary (Signed)
Name: Adam Gonzales MRN: [id number] DOB: 06-25-1945 72 y.o. PCP: Levin Erp, MD  Date of Admission: 01/01/2018  8:27 PM Date of Discharge: 01/03/2018 Attending Physician: No att. providers found  Discharge Diagnosis: 1. Monoacute arthritis 2/2 gout   Discharge Medications: Allergies as of 01/03/2018   No Known Allergies     Medication List    STOP taking these medications   hydrochlorothiazide 12.5 MG tablet Commonly known as:  HYDRODIURIL     TAKE these medications   aspirin EC 81 MG tablet Take 81 mg by mouth daily.   finasteride 5 MG tablet Commonly known as:  PROSCAR Take 5 mg by mouth daily.   fluticasone 50 MCG/ACT nasal spray Commonly known as:  FLONASE Place 1 spray into both nostrils daily. What changed:    when to take this  reasons to take this   lisinopril 40 MG tablet Commonly known as:  PRINIVIL,ZESTRIL Take 40 mg by mouth daily.   losartan 25 MG tablet Commonly known as:  COZAAR Take 25 mg by mouth 2 (two) times daily.   meclizine 25 MG tablet Commonly known as:  ANTIVERT Take 1 tablet (25 mg total) by mouth 3 (three) times daily as needed for dizziness.   spironolactone 25 MG tablet Commonly known as:  ALDACTONE Take 25 mg by mouth daily.   tamsulosin 0.4 MG Caps capsule Commonly known as:  FLOMAX Take 0.4 mg by mouth daily.       Disposition and follow-up:   Adam Gonzales was discharged from [facility] in Good condition.  At the hospital follow up visit please address:  1.  Discuss BP medications. We discontinued is HCTZ due to gout flare.       Examine left elbow - there was minimal swelling, and no redness or warmth at time of discharge.   2.  Labs / imaging needed at time of follow-up: none  3.  Pending labs/ test needing follow-up: none  Follow-up Appointments:   Hospital Course by problem list: 40.  72 year old gentleman with CKD stage II, BPH and hypertension and history of acutely  swollen joints who presented with a 5-day history of increasing pain, swelling and decreased range of motion of the left elbow.  He was initially seen by his PCP, arthrocentesis was performed which revealed turbid yellow/green fluid, WBC 46,000 and elevated neutrophils, and monosodium urate crystals.  Gram stain was negative.  Concern for septic arthritis versus gout.  Patient was afebrile without leukocytosis.  He was monitored in the hospital for 2 nights.  He was given 3 doses of indomethacin and then treated with IV vancomycin until synovial fluid cultures were negative for 2 days.  Clinically, the patient's swollen left elbow dramatically improved.  And at time of discharge, there was minimal swelling and no redness or warmth.  His range of motion and pain had greatly improved.  At discharge, his cultures remain negative and his HCTZ was discontinued and he was instructed to follow-up with PCP at his already scheduled appointment.  Discharge Vitals:   BP 126/74 (BP Location: Right Arm)   Pulse 60   Temp 98.1 F (36.7 C) (Oral)   Resp 16   SpO2 100%   Pertinent Labs, Studies, and Procedures:  Recent Results (from the past 240 hour(s))  Body fluid culture     Status: None (Preliminary result)   Collection Time: 01/01/18  5:20 PM  Result Value Ref Range Status   Specimen Description FLUID SYNOVIAL  Final   Special Requests NONE  Final   Gram Stain   Final    MODERATE WBC PRESENT, PREDOMINANTLY PMN NO ORGANISMS SEEN RESULT CALLED TO, READ BACK BY AND VERIFIED WITH: E GREEN MD 01/01/18 1755 JDW    Culture   Final    NO GROWTH 2 DAYS Performed at [facility] [address], [place], [place] [address]    Report Status PENDING  Incomplete     Discharge Instructions: Discharge Instructions    Call MD for:  redness, tenderness, or signs of infection (pain, swelling, redness, odor or green/yellow discharge around incision site)   Complete by:  As directed    Diet - low sodium  heart healthy   Complete by:  As directed    Discharge instructions   Complete by:  As directed    Adam Gonzales, Adam Gonzales were admitted to the hospital for concerns of an infected joint.  We call this septic arthritis.   You were given 2 doses of IV antibiotics in case this turned out to be septic arthritis. So far, your cultures have not grown anything.  It is reassuring that your elbow is getting better and you are not having any fevers.  I spoke with Dr. Nyoka Cowden and we feel comfortable sending you home today.  If your fluid cultures ended up growing something, we will call you.  Dr. Nyoka Cowden is also on-call for his office so you can get a hold of him if you need anything.  It is most likely that this was just another gout flare.  Dr. Nyoka Cowden is okay with you holding your hydrochlorothiazide and following up with him at your next appointment.  If you notice fevers, nausea, vomiting, increased swelling, redness, warmth or pain in that left elbow or any other joint please come back to the hospital.  Was a pleasure taking care of you!  Happy Thanksgiving  -Dr. Donne Hazel   Increase activity slowly   Complete by:  As directed       Signed: Isabelle Course, MD 01/04/2018, 10:30 AM   Pager: [phone]

## 2018-01-05 ENCOUNTER — Other Ambulatory Visit: Payer: Self-pay

## 2018-01-05 ENCOUNTER — Emergency Department (HOSPITAL_COMMUNITY)
Admission: EM | Admit: 2018-01-05 | Discharge: 2018-01-05 | Disposition: A | Discharge status: Home / Self Care | Payer: Medicare Other | Attending: Emergency Medicine | Admitting: Emergency Medicine

## 2018-01-05 ENCOUNTER — Emergency Department (HOSPITAL_COMMUNITY): Payer: Medicare Other

## 2018-01-05 DIAGNOSIS — R52 Pain, unspecified: Secondary | ICD-10-CM | POA: Diagnosis not present

## 2018-01-05 DIAGNOSIS — N182 Chronic kidney disease, stage 2 (mild): Secondary | ICD-10-CM | POA: Diagnosis not present

## 2018-01-05 DIAGNOSIS — I129 Hypertensive chronic kidney disease with stage 1 through stage 4 chronic kidney disease, or unspecified chronic kidney disease: Secondary | ICD-10-CM | POA: Diagnosis not present

## 2018-01-05 DIAGNOSIS — Y939 Activity, unspecified: Secondary | ICD-10-CM | POA: Diagnosis not present

## 2018-01-05 DIAGNOSIS — S76101A Unspecified injury of right quadriceps muscle, fascia and tendon, initial encounter: Secondary | ICD-10-CM | POA: Insufficient documentation

## 2018-01-05 DIAGNOSIS — S8001XA Contusion of right knee, initial encounter: Secondary | ICD-10-CM | POA: Diagnosis not present

## 2018-01-05 DIAGNOSIS — Y999 Unspecified external cause status: Secondary | ICD-10-CM | POA: Insufficient documentation

## 2018-01-05 DIAGNOSIS — I10 Essential (primary) hypertension: Secondary | ICD-10-CM | POA: Diagnosis not present

## 2018-01-05 DIAGNOSIS — Y9289 Other specified places as the place of occurrence of the external cause: Secondary | ICD-10-CM | POA: Insufficient documentation

## 2018-01-05 DIAGNOSIS — X500XXA Overexertion from strenuous movement or load, initial encounter: Secondary | ICD-10-CM | POA: Diagnosis not present

## 2018-01-05 DIAGNOSIS — S80919A Unspecified superficial injury of unspecified knee, initial encounter: Secondary | ICD-10-CM | POA: Diagnosis not present

## 2018-01-05 DIAGNOSIS — W19XXXA Unspecified fall, initial encounter: Secondary | ICD-10-CM

## 2018-01-05 DIAGNOSIS — M25561 Pain in right knee: Secondary | ICD-10-CM | POA: Diagnosis not present

## 2018-01-05 DIAGNOSIS — S76109A Unspecified injury of unspecified quadriceps muscle, fascia and tendon, initial encounter: Secondary | ICD-10-CM

## 2018-01-05 DIAGNOSIS — S8991XA Unspecified injury of right lower leg, initial encounter: Secondary | ICD-10-CM | POA: Diagnosis not present

## 2018-01-05 DIAGNOSIS — S82041A Displaced comminuted fracture of right patella, initial encounter for closed fracture: Secondary | ICD-10-CM | POA: Diagnosis not present

## 2018-01-05 LAB — CBC WITH DIFFERENTIAL/PLATELET
Abs Immature Granulocytes: 0.02 10*3/uL (ref 0.00–0.07)
BASOS PCT: 1 %
Basophils Absolute: 0.1 10*3/uL (ref 0.0–0.1)
EOS ABS: 0.1 10*3/uL (ref 0.0–0.5)
EOS PCT: 1 %
HCT: 46.9 % (ref 39.0–52.0)
Hemoglobin: 14.3 g/dL (ref 13.0–17.0)
Immature Granulocytes: 0 %
Lymphocytes Relative: 11 %
Lymphs Abs: 1.1 10*3/uL (ref 0.7–4.0)
MCH: 29.7 pg (ref 26.0–34.0)
MCHC: 30.5 g/dL (ref 30.0–36.0)
MCV: 97.3 fL (ref 80.0–100.0)
MONO ABS: 0.9 10*3/uL (ref 0.1–1.0)
Monocytes Relative: 9 %
NEUTROS ABS: 7.6 10*3/uL (ref 1.7–7.7)
Neutrophils Relative %: 78 %
PLATELETS: 273 10*3/uL (ref 150–400)
RBC: 4.82 MIL/uL (ref 4.22–5.81)
RDW: 13.6 % (ref 11.5–15.5)
WBC: 9.7 10*3/uL (ref 4.0–10.5)
nRBC: 0 % (ref 0.0–0.2)

## 2018-01-05 LAB — BASIC METABOLIC PANEL
Anion gap: 11 (ref 5–15)
BUN: 22 mg/dL (ref 8–23)
CALCIUM: 9.4 mg/dL (ref 8.9–10.3)
CO2: 22 mmol/L (ref 22–32)
CREATININE: 1 mg/dL (ref 0.61–1.24)
Chloride: 105 mmol/L (ref 98–111)
Glucose, Bld: 107 mg/dL — ABNORMAL HIGH (ref 70–99)
POTASSIUM: 4.2 mmol/L (ref 3.5–5.1)
SODIUM: 138 mmol/L (ref 135–145)

## 2018-01-05 MED ORDER — ETOMIDATE 2 MG/ML IV SOLN
10.0000 mg | Freq: Once | INTRAVENOUS | Status: AC
  Administered 2018-01-05: 10 mg via INTRAVENOUS
  Filled 2018-01-05: qty 10

## 2018-01-05 MED ORDER — HYDROMORPHONE HCL 1 MG/ML IJ SOLN
1.0000 mg | Freq: Once | INTRAMUSCULAR | Status: AC
  Administered 2018-01-05: 1 mg via INTRAVENOUS
  Filled 2018-01-05: qty 1

## 2018-01-05 MED ORDER — SODIUM CHLORIDE (PF) 0.9 % IJ SOLN
INTRAMUSCULAR | Status: AC
  Filled 2018-01-05: qty 50

## 2018-01-05 MED ORDER — IOPAMIDOL (ISOVUE-370) INJECTION 76%
INTRAVENOUS | Status: AC
  Filled 2018-01-05: qty 100

## 2018-01-05 MED ORDER — IOPAMIDOL (ISOVUE-370) INJECTION 76%
100.0000 mL | Freq: Once | INTRAVENOUS | Status: AC | PRN
  Administered 2018-01-05: 100 mL via INTRAVENOUS

## 2018-01-05 MED ORDER — MORPHINE SULFATE (PF) 4 MG/ML IV SOLN
4.0000 mg | Freq: Once | INTRAVENOUS | Status: AC
  Administered 2018-01-05: 4 mg via INTRAVENOUS
  Filled 2018-01-05: qty 1

## 2018-01-05 MED ORDER — HYDROMORPHONE HCL 1 MG/ML IJ SOLN
1.0000 mg | INTRAMUSCULAR | Status: DC | PRN
  Administered 2018-01-05: 1 mg via INTRAVENOUS
  Filled 2018-01-05: qty 1

## 2018-01-05 MED ORDER — OXYCODONE HCL 5 MG PO TABS: 5.0000 mg | ORAL_TABLET | ORAL | 0 refills | Status: DC | PRN

## 2018-01-05 MED ORDER — ONDANSETRON HCL 4 MG/2ML IJ SOLN
4.0000 mg | Freq: Once | INTRAMUSCULAR | Status: AC
  Administered 2018-01-05: 4 mg via INTRAVENOUS
  Filled 2018-01-05: qty 2

## 2018-01-05 NOTE — ED Triage Notes (Signed)
Pt presents to ER via EMS after a fall. Pt states he heard a pop in his right knee and fell to the ground. Pt denies hitting his head or LOC. Pt unable to move knee at all without excruciating pain.

## 2018-01-05 NOTE — ED Provider Notes (Addendum)
Emergency Department Provider Note   I have reviewed the triage vital signs and the nursing notes.   HISTORY  Chief Complaint Fall   HPI Adam Gonzales is a 72 y.o. male with PMH of HTN and Gout presents to the emergency department with severe right knee pain.  The patient was lifting a Christmas tree today when his knee suddenly gave out.  He fell to the ground but was able to catch himself with his arms.  There was no head trauma.  Patient had no presyncope symptoms prior to falling.  He is having severe pain in the leg and feels well only with bending the knee at beyond a 90 degree angle.  Denies any numbness or tingling in the lower extremity no pain in the hip or ankle.  Pain is severe and worse with movement.  No other modifying factors.  Past Medical History:  Diagnosis Date  . 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 Gout   . Hypertension     Patient Active Problem List   Diagnosis Date Noted  . 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 HTN (hypertension) 01/02/2018  . Acute monoarthritis 01/01/2018  . 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 CYST EXCISION      Allergies Patient has no known allergies.  Family History  Problem Relation Age of Onset  . Heart failure Mother   . Cancer - Colon Father   . Dementia Sister   . Cancer Brother        throat    Social History Social History   Tobacco Use  . Smoking status: Never Smoker  . Smokeless tobacco: Never Used  Substance Use Topics  . Alcohol use: No  . Drug use: No    Review of Systems  Constitutional: No fever/chills Eyes: No visual changes. ENT: No sore throat. Cardiovascular: Denies chest pain. Respiratory: Denies shortness of breath. Gastrointestinal: No abdominal pain.  No nausea, no vomiting.  No diarrhea.  No constipation. Genitourinary: Negative for dysuria. Musculoskeletal: Positive severe right knee pain.   Skin: Negative for rash. Neurological: Negative for headaches, focal weakness or numbness.  10-point ROS otherwise negative.  ____________________________________________   PHYSICAL EXAM:  VITAL SIGNS: ED Triage Vitals  Enc Vitals Group     BP 01/05/18 1200 (!) 158/62     Pulse --      Resp 01/05/18 1200 18     Temp 01/05/18 1200 98 F (36.7 C)     Temp Source 01/05/18 1200 Oral     SpO2 01/05/18 1200 100 %     Weight 01/05/18 1201 185 lb (83.9 kg)     Height 01/05/18 1201 5\' 10"  (1.778 m)     Pain Score 01/05/18 1201 9   Constitutional: Alert and oriented. Well appearing and in no acute distress. Eyes: Conjunctivae are normal.  Head: Atraumatic. Nose: No congestion/rhinnorhea. Mouth/Throat: Mucous membranes are moist.  Neck: No stridor. C collar in place.  Cardiovascular: Normal rate, regular rhythm. Good peripheral circulation. Grossly normal heart sounds.   Respiratory: Normal respiratory effort.  No retractions. Lungs CTAB. Gastrointestinal: Soft and nontender. No distention.  Musculoskeletal: Right knee and 90 degree flexion. Normal distal pulses and sensation. Notable knee deformity without open fracture. Under sedation leg straightens easily. No clear patellar dislocation but significant suprapatellar hematoma. Normal ROM of the hip, ankle, and knee under sedation.  Neurologic:  Normal speech and language. No gross  focal neurologic deficits are appreciated.  Skin:  Skin is warm, dry and intact. No rash noted.  ____________________________________________  RADIOLOGY  Dg Knee 1-2 Views Right  Result Date: 01/05/2018 CLINICAL DATA:  Fall, right knee pain, injury, popping sensation EXAM: RIGHT KNEE - 1-2 VIEW COMPARISON:  None. FINDINGS: Limited exam because of positioning. AP view is oblique and the knee is flexed. On the lateral view there is no gross malalignment, large effusion or definite acute osseous finding or fracture. Peripheral atherosclerosis noted.  IMPRESSION: Limited exam because of positioning. No definite acute osseous finding, malalignment or fracture on the lateral view Peripheral atherosclerosis Electronically Signed   By: Jerilynn Mages.  Shick M.D.   On: 01/05/2018 12:57   Ct Angio Low Extrem Right W &/or Wo Contrast  Result Date: 01/05/2018 CLINICAL DATA:  Reported history of right patellar dislocation status post reduction. Severe right knee pain. EXAM: CT ANGIOGRAPHY OF THE RIGHT LOWEREXTREMITY TECHNIQUE: Multidetector CT imaging of the right lower extremity was performed using the standard protocol during bolus administration of intravenous contrast. Multiplanar CT image reconstructions and MIPs were obtained to evaluate the vascular anatomy. CONTRAST:  199mL ISOVUE-370 IOPAMIDOL (ISOVUE-370) INJECTION 76% COMPARISON:  Right knee radiographs from earlier today. 01/21/2015 CT abdomen/pelvis. FINDINGS: VASCULAR: RIGHT LOWER EXTREMITY: INFLOW: Mildly atherosclerotic nonaneurysmal right common iliac and right external iliac arteries without significant stenosis or pseudoaneurysm. OUTFLOW: Mildly atherosclerotic nonaneurysmal right common femoral, profunda and superficial femoral and popliteal arteries without significant stenosis or pseudoaneurysm. RUNOFF: Moderately atherosclerotic patent tibioperoneal trunk. Posterior tibial artery is moderately atherosclerotic and patent with runoff to the foot. Peroneal artery is occluded distally. Anterior tibial artery is occluded at the level of the distal tibial shaft with distal reconstitution by the perennial artery, with patent dorsalis pedis artery. No evidence of deep venous thrombosis in the visualized distal right lower extremity on the delayed venous scans. NON-VASCULAR: There are clustered avulsion fracture fragments along the lateral quadriceps tendon approximately 1.8 cm above the right superior patellar margin. There is abnormal thickening of the quadriceps tendon with small amount of fluid surrounding the  patella and quadriceps tendon. Prepatellar soft tissue swelling. No patellar or knee dislocation. Minimal tricompartmental right knee osteoarthritis. No aggressive appearing focal osseous lesions. Marked lower lumbar spondylosis. Moderately enlarged prostate with nonspecific internal prostatic calcifications. Right posterior bladder wall 4.8 x 2.8 cm diverticulum. No pathologically enlarged lymph nodes in the visualized right pelvis or right lower extremity. Review of the MIP images confirms the above findings. IMPRESSION: VASCULAR: 1. No acute vascular abnormality. No significant inflow or outflow arterial stenosis in the right lower extremity. Two-vessel arterial runoff to the right foot, as detailed. 2.  Aortic Atherosclerosis (ICD10-I70.0). NON-VASCULAR: 1. Avulsion injury of the quadriceps tendon with clustered avulsion fracture fragments superolaterally displaced from the patella. Fluid surrounding the quadriceps tendon and patella. No patellar or knee dislocation. 2. Moderate prostatomegaly. Right posterior bladder wall diverticulum. Electronically Signed   By: Ilona Sorrel M.D.   On: 01/05/2018 17:22    ____________________________________________   PROCEDURES  Procedure(s) performed:   .Sedation Date/Time: 01/05/2018 3:39 PM Performed by: Margette Fast, MD Authorized by: Margette Fast, MD   Consent:    Consent obtained:  Written   Consent given by:  Patient   Risks discussed:  Allergic reaction, dysrhythmia, inadequate sedation, nausea, prolonged hypoxia resulting in organ damage, prolonged sedation necessitating reversal, vomiting and respiratory compromise necessitating ventilatory assistance and intubation   Alternatives discussed:  Analgesia without sedation Universal protocol:    Immediately  prior to procedure a time out was called: yes     Patient identity confirmation method:  Arm band, hospital-assigned identification number and verbally with patient Indications:     Sedation is required to allow for: Exam of ortho injury under sedation  Pre-sedation assessment:    Time since last food or drink:  5 hours   ASA classification: class 1 - normal, healthy patient     Mallampati score:  I - soft palate, uvula, fauces, pillars visible   Pre-sedation assessments completed and reviewed: airway patency, cardiovascular function, hydration status, mental status, nausea/vomiting, pain level, respiratory function and temperature   Immediate pre-procedure details:    Reassessment: Patient reassessed immediately prior to procedure     Reviewed: vital signs, relevant labs/tests and NPO status     Verified: bag valve mask available, emergency equipment available, intubation equipment available, IV patency confirmed, oxygen available and suction available   Procedure details (see MAR for exact dosages):    Preoxygenation:  Nasal cannula   Sedation:  Etomidate   Analgesia:  Hydromorphone   Intra-procedure monitoring:  Blood pressure monitoring, cardiac monitor, continuous capnometry, continuous pulse oximetry, frequent LOC assessments and frequent vital sign checks   Intra-procedure events: none     Total Provider sedation time (minutes):  30 Post-procedure details:    Attendance: Constant attendance by certified staff until patient recovered     Recovery: Patient returned to pre-procedure baseline     Post-sedation assessments completed and reviewed: airway patency, cardiovascular function, hydration status, mental status, nausea/vomiting, pain level, respiratory function and temperature     Patient is stable for discharge or admission: yes     Patient tolerance:  Tolerated well, no immediate complications    CRITICAL CARE Performed by: Margette Fast Total critical care time: 35 minutes Critical care time was exclusive of separately billable procedures and treating other patients. Critical care was necessary to treat or prevent imminent or life-threatening  deterioration. Critical care was time spent personally by me on the following activities: development of treatment plan with patient and/or surrogate as well as nursing, discussions with consultants, evaluation of patient's response to treatment, examination of patient, obtaining history from patient or surrogate, ordering and performing treatments and interventions, ordering and review of laboratory studies, ordering and review of radiographic studies, pulse oximetry and re-evaluation of patient's condition.  Nanda Quinton, MD Emergency Medicine  ____________________________________________   INITIAL IMPRESSION / ASSESSMENT AND PLAN / ED COURSE  Pertinent labs & imaging results that were available during my care of the patient were reviewed by me and considered in my medical decision making (see chart for details).  Patient presents to the emergency department with acute onset right knee pain.  He has exquisite tenderness over the patella which does appear to be dislocated clinically.  He will not allow any manipulation of the area without pain medication.  We will try morphine and obtain portable plain film x-ray to confirm dislocation and rule out fracture.  No pain in the hip or ankle.  Exam performed under sedation as above. Laxity of the patella with suprapatellar hematoma.  Considered knee dislocation with relocation and plan for CT angio of the right knee which can also evaluate for occult fracture. Patient placed in knee immobilizer. Labs reviewed without acute findings.   CT pending. Care transferred to Dr. Sedonia Small who will follow CT and assist with disposition.  ____________________________________________  FINAL CLINICAL IMPRESSION(S) / ED DIAGNOSES  Final diagnoses:  Acute pain of right knee  Fall, initial encounter  Injury of quadriceps muscle     MEDICATIONS GIVEN DURING THIS VISIT:  Medications  HYDROmorphone (DILAUDID) injection 1 mg (1 mg Intravenous Given 01/05/18 1428)   sodium chloride (PF) 0.9 % injection (has no administration in time range)  iopamidol (ISOVUE-370) 76 % injection (has no administration in time range)  morphine 4 MG/ML injection 4 mg (4 mg Intravenous Given 01/05/18 1205)  HYDROmorphone (DILAUDID) injection 1 mg (1 mg Intravenous Given 01/05/18 1248)  etomidate (AMIDATE) injection 10 mg (10 mg Intravenous Given 01/05/18 1357)  ondansetron (ZOFRAN) injection 4 mg (4 mg Intravenous Given 01/05/18 1357)  iopamidol (ISOVUE-370) 76 % injection 100 mL (100 mLs Intravenous Contrast Given 01/05/18 1532)     NEW OUTPATIENT MEDICATIONS STARTED DURING THIS VISIT:  Discharge Medication List as of 01/05/2018  6:15 PM    START taking these medications   Details  oxyCODONE (ROXICODONE) 5 MG immediate release tablet Take 1 tablet (5 mg total) by mouth every 4 (four) hours as needed for severe pain., Starting Sat 01/05/2018, Print        Note:  This document was prepared using Dragon voice recognition software and may include unintentional dictation errors.  Nanda Quinton, MD Emergency Medicine    Long, Wonda Olds, MD 01/05/18 2105    Margette Fast, MD 01/15/18 [phone]

## 2018-01-05 NOTE — ED Provider Notes (Signed)
  Provider Note MRN:  [id number]  Arrival date & time: 01/05/18    ED Course and Medical Decision Making  I received sign out for this patient at shift change from Dr. Laverta Baltimore.  CT reveals no vascular injuries, quadriceps tendon injury with avulsion fragments.  Orthopedic surgery was consulted, Dr. Erlinda Hong reviewed the imaging and will see patient in clinic.  Knee immobilizer, weightbearing as tolerated.  Given short course oxycodone for pain.  After the discussed management above, the patient was determined to be safe for discharge.  The patient was in agreement with this plan and all questions regarding their care were answered.  ED return precautions were discussed and the patient will return to the ED with any significant worsening of condition.  Barth Kirks. Sedonia Small, [place] [email or web address]    Maudie Flakes, MD 01/05/18 Tresa Moore

## 2018-01-05 NOTE — Discharge Instructions (Addendum)
You were evaluated in the Emergency Department and after careful evaluation, we did not find any emergent condition requiring admission or further testing in the hospital.  Your symptoms today seem to be due to an injury to the quadriceps tendon.  Please keep your leg in the knee immobilizer and follow-up with orthopedic surgery.  You can use the medication provided for severe pain at night if you are having trouble sleeping.  Please return to the Emergency Department if you experience any worsening of your condition.  We encourage you to follow up with a primary care provider.  Thank you for allowing [place] to be a part of your care.

## 2018-01-07 LAB — BODY FLUID CULTURE

## 2018-01-08 ENCOUNTER — Encounter (INDEPENDENT_AMBULATORY_CARE_PROVIDER_SITE_OTHER): Payer: Self-pay | Admitting: Orthopaedic Surgery

## 2018-01-08 ENCOUNTER — Other Ambulatory Visit: Payer: Self-pay

## 2018-01-08 ENCOUNTER — Ambulatory Visit (INDEPENDENT_AMBULATORY_CARE_PROVIDER_SITE_OTHER): Payer: Medicare Other | Admitting: Orthopaedic Surgery

## 2018-01-08 ENCOUNTER — Encounter (HOSPITAL_BASED_OUTPATIENT_CLINIC_OR_DEPARTMENT_OTHER): Payer: Self-pay | Admitting: *Deleted

## 2018-01-08 ENCOUNTER — Telehealth: Payer: Self-pay | Admitting: Emergency Medicine

## 2018-01-08 DIAGNOSIS — S76111A Strain of right quadriceps muscle, fascia and tendon, initial encounter: Secondary | ICD-10-CM

## 2018-01-08 NOTE — Progress Notes (Signed)
Office Visit Note   Patient: Adam Gonzales           Date of Birth: 1945/06/25           MRN: [id number] Visit Date: 01/08/2018              Requested by: Levin Erp, MD [place], [address], [place] [address] PCP: Levin Erp, MD   Assessment & Plan: Visit Diagnoses:  1. Rupture of right quadriceps muscle, initial encounter     Plan: Impression is right acute quadriceps rupture.  These findings were discussed with the patient and recommendations for surgical repair.  Discussion of risks and benefits and rehab and recovery.  They understand and wish to proceed.  We will plan on surgery next week to allow the swelling to improve.  In the meantime we will send him to [place] for a locked Bledsoe brace.  Follow-Up Instructions: Return for 2 weeks postop.   Orders:  No orders of the defined types were placed in this encounter.  No orders of the defined types were placed in this encounter.     Procedures: No procedures performed   Clinical Data: No additional findings.   Subjective: Chief Complaint  Patient presents with  . Right Knee - Pain    Adam Gonzales is a very pleasant 72 year old gentleman who comes in today for an acute injury to his right knee that he sustained over the weekend.  He stepped in a hole on extended leg and felt a pop in his knee region.  He felt immediate pain and inability to stand up.  He was evaluated in the ED and the x-rays and CT scan showed an avulsion fracture of the superior patella.  CT angiogram was also obtained for reasons of not exactly sure.  He was placed in a knee immobilizer and he follows up today.  His wife works in the preop clinic at [employer].   Review of Systems  Constitutional: Negative.   All other systems reviewed and are negative.    Objective: Vital Signs: There were no vitals taken for this visit.  Physical Exam  Constitutional: He is oriented to person, place, and time. He appears well-developed and  well-nourished.  HENT:  Head: Normocephalic and atraumatic.  Eyes: Pupils are equal, round, and reactive to light.  Neck: Neck supple.  Pulmonary/Chest: Effort normal.  Abdominal: Soft.  Musculoskeletal: Normal range of motion.  Neurological: He is alert and oriented to person, place, and time.  Skin: Skin is warm.  Psychiatric: He has a normal mood and affect. His behavior is normal. Judgment and thought content normal.  Nursing note and vitals reviewed.   Ortho Exam Right knee exam shows a sunken area just superior to the patella with pain on palpation.  There is mild ecchymosis.  There is moderate swelling.  He is unable to initiate extension of the knee.  His patella tendon is nontender.  He is neurovascular intact distally. Specialty Comments:  No specialty comments available.  Imaging: No results found.   PMFS History: Patient Active Problem List   Diagnosis Date Noted  . 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 HTN (hypertension) 01/02/2018  . Acute monoarthritis 01/01/2018  . 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 Gout   . Hypertension     Family History  Problem Relation Age of Onset  . Heart failure Mother   .  Cancer - Colon Father   . Dementia Sister   . Cancer Brother        throat    Past Surgical History:  Procedure Laterality Date  . CYST EXCISION     Social History   Occupational History  . Not on file  Tobacco Use  . Smoking status: Never Smoker  . Smokeless tobacco: Never Used  Substance and Sexual Activity  . Alcohol use: No  . Drug use: No  . Sexual activity: Not on file

## 2018-01-08 NOTE — Progress Notes (Signed)
ED Antimicrobial Stewardship Positive Culture Follow Up   Adam Gonzales is an 72 y.o. male who presented to [facility] on 01/05/2018 with a chief complaint of  Chief Complaint  Patient presents with  . Fall    Recent Results (from the past 720 hour(s))  Body fluid culture     Status: None   Collection Time: 01/01/18  5:20 PM  Result Value Ref Range Status   Specimen Description FLUID SYNOVIAL  Final   Special Requests NONE  Final   Gram Stain   Final    MODERATE WBC PRESENT, PREDOMINANTLY PMN NO ORGANISMS SEEN RESULT CALLED TO, READ BACK BY AND VERIFIED WITH: E GREEN MD 01/01/18 1755 JDW Performed at [facility], [address] [place], [place] [address]    Culture   Final    RARE ROTHIA MUCILAGINOSA RARE VIRIDANS STREPTOCOCCUS RARE NEISSERIA SICCA RARE ACTINOMYCES [id number]    Report Status 01/07/2018 FINAL  Final   Elbow swelling and range of motion rapidly improved during short hospitalization and determined to be gout flare. Came back to ED 2 days after with knee pain after hearing popping sound and fall. Planning to follow up with Ortho as outpatient and no complaints with elbow during that ED visit. No reason to treat cx with abx.  ED Provider: Lenn Sink PA-C   Reginia Naas 01/08/2018, 8:47 AM Clinical Pharmacist Monday - Friday phone -  [phone] Saturday - Sunday phone - [phone]

## 2018-01-08 NOTE — Telephone Encounter (Signed)
Post ED Visit - Positive Culture Follow-up  Culture report reviewed by antimicrobial stewardship pharmacist:  [x]  Elenor Quinones, Pharm.D. []  Heide Guile, Pharm.D., BCPS AQ-ID []  Parks Neptune, Pharm.D., BCPS []  Alycia Rossetti, Pharm.D., BCPS []  [place], Pharm.D., BCPS, AAHIVP []  Legrand Como, Pharm.D., BCPS, AAHIVP []  Salome Arnt, PharmD, BCPS []  Johnnette Gourd, PharmD, BCPS []  Hughes Better, PharmD, BCPS []  Leeroy Cha, PharmD  Positive body fluid culture Treated with none, asymptomatic, organism sensitive to the same and no further patient follow-up is required at this time.  Hazle Nordmann 01/08/2018, 10:55 AM

## 2018-01-09 ENCOUNTER — Ambulatory Visit (INDEPENDENT_AMBULATORY_CARE_PROVIDER_SITE_OTHER): Payer: Medicare Other | Admitting: Orthopaedic Surgery

## 2018-01-14 ENCOUNTER — Ambulatory Visit (HOSPITAL_BASED_OUTPATIENT_CLINIC_OR_DEPARTMENT_OTHER): Payer: Medicare Other | Admitting: Anesthesiology

## 2018-01-14 ENCOUNTER — Ambulatory Visit (HOSPITAL_COMMUNITY)
Admission: RE | Admit: 2018-01-14 | Discharge: 2018-01-14 | Disposition: A | Discharge status: Home / Self Care | Payer: Medicare Other | Source: Ambulatory Visit | Attending: Orthopaedic Surgery | Admitting: Orthopaedic Surgery

## 2018-01-14 ENCOUNTER — Encounter (HOSPITAL_BASED_OUTPATIENT_CLINIC_OR_DEPARTMENT_OTHER): Payer: Self-pay

## 2018-01-14 ENCOUNTER — Other Ambulatory Visit: Payer: Self-pay

## 2018-01-14 ENCOUNTER — Ambulatory Visit (HOSPITAL_COMMUNITY): Payer: Medicare Other

## 2018-01-14 ENCOUNTER — Encounter (HOSPITAL_BASED_OUTPATIENT_CLINIC_OR_DEPARTMENT_OTHER)
Admission: RE | Disposition: A | Discharge status: Home / Self Care | Payer: Self-pay | Source: Ambulatory Visit | Attending: Orthopaedic Surgery

## 2018-01-14 DIAGNOSIS — X58XXXA Exposure to other specified factors, initial encounter: Secondary | ICD-10-CM | POA: Insufficient documentation

## 2018-01-14 DIAGNOSIS — I10 Essential (primary) hypertension: Secondary | ICD-10-CM | POA: Insufficient documentation

## 2018-01-14 DIAGNOSIS — Z791 Long term (current) use of non-steroidal anti-inflammatories (NSAID): Secondary | ICD-10-CM | POA: Insufficient documentation

## 2018-01-14 DIAGNOSIS — M109 Gout, unspecified: Secondary | ICD-10-CM | POA: Insufficient documentation

## 2018-01-14 DIAGNOSIS — S76111A Strain of right quadriceps muscle, fascia and tendon, initial encounter: Secondary | ICD-10-CM | POA: Diagnosis not present

## 2018-01-14 DIAGNOSIS — Z7982 Long term (current) use of aspirin: Secondary | ICD-10-CM | POA: Diagnosis not present

## 2018-01-14 DIAGNOSIS — G8918 Other acute postprocedural pain: Secondary | ICD-10-CM | POA: Diagnosis not present

## 2018-01-14 DIAGNOSIS — M79672 Pain in left foot: Secondary | ICD-10-CM | POA: Diagnosis not present

## 2018-01-14 DIAGNOSIS — I129 Hypertensive chronic kidney disease with stage 1 through stage 4 chronic kidney disease, or unspecified chronic kidney disease: Secondary | ICD-10-CM | POA: Diagnosis not present

## 2018-01-14 DIAGNOSIS — M7989 Other specified soft tissue disorders: Secondary | ICD-10-CM | POA: Diagnosis not present

## 2018-01-14 DIAGNOSIS — Z79899 Other long term (current) drug therapy: Secondary | ICD-10-CM | POA: Insufficient documentation

## 2018-01-14 DIAGNOSIS — R52 Pain, unspecified: Secondary | ICD-10-CM

## 2018-01-14 DIAGNOSIS — N182 Chronic kidney disease, stage 2 (mild): Secondary | ICD-10-CM | POA: Diagnosis not present

## 2018-01-14 HISTORY — PX: QUADRICEPS TENDON REPAIR: SHX756

## 2018-01-14 HISTORY — DX: Strain of right quadriceps muscle, fascia and tendon, initial encounter: S76.111A

## 2018-01-14 SURGERY — REPAIR, TENDON, QUADRICEPS
Anesthesia: Regional | Site: Knee | Laterality: Right

## 2018-01-14 MED ORDER — MIDAZOLAM HCL 2 MG/2ML IJ SOLN
1.0000 mg | INTRAMUSCULAR | Status: DC | PRN
  Administered 2018-01-14: 1 mg via INTRAVENOUS

## 2018-01-14 MED ORDER — LIDOCAINE HCL (CARDIAC) PF 100 MG/5ML IV SOSY
PREFILLED_SYRINGE | INTRAVENOUS | Status: DC | PRN
  Administered 2018-01-14: 80 mg via INTRAVENOUS

## 2018-01-14 MED ORDER — ROPIVACAINE HCL 7.5 MG/ML IJ SOLN
INTRAMUSCULAR | Status: DC | PRN
  Administered 2018-01-14: 20 mL via PERINEURAL

## 2018-01-14 MED ORDER — FENTANYL CITRATE (PF) 100 MCG/2ML IJ SOLN
INTRAMUSCULAR | Status: AC
  Filled 2018-01-14: qty 2

## 2018-01-14 MED ORDER — SENNOSIDES-DOCUSATE SODIUM 8.6-50 MG PO TABS: 1.0000 | ORAL_TABLET | Freq: Every evening | ORAL | 1 refills | Status: DC | PRN

## 2018-01-14 MED ORDER — LIDOCAINE HCL (PF) 1 % IJ SOLN
INTRAMUSCULAR | Status: AC
  Filled 2018-01-14: qty 30

## 2018-01-14 MED ORDER — ONDANSETRON HCL 4 MG/2ML IJ SOLN
INTRAMUSCULAR | Status: DC | PRN
  Administered 2018-01-14: 4 mg via INTRAVENOUS

## 2018-01-14 MED ORDER — LACTATED RINGERS IV SOLN
INTRAVENOUS | Status: DC
  Administered 2018-01-14 (×2): via INTRAVENOUS

## 2018-01-14 MED ORDER — BUPIVACAINE HCL (PF) 0.25 % IJ SOLN
INTRAMUSCULAR | Status: DC | PRN
  Administered 2018-01-14: 10 mL

## 2018-01-14 MED ORDER — CHLORHEXIDINE GLUCONATE 4 % EX LIQD: 60.0000 mL | Freq: Once | CUTANEOUS | Status: DC

## 2018-01-14 MED ORDER — PHENYLEPHRINE HCL 10 MG/ML IJ SOLN
INTRAMUSCULAR | Status: AC
  Filled 2018-01-14: qty 1

## 2018-01-14 MED ORDER — METHOCARBAMOL 750 MG PO TABS: 750.0000 mg | ORAL_TABLET | Freq: Two times a day (BID) | ORAL | 0 refills | Status: DC | PRN

## 2018-01-14 MED ORDER — CEFAZOLIN SODIUM-DEXTROSE 2-4 GM/100ML-% IV SOLN
2.0000 g | INTRAVENOUS | Status: AC
  Administered 2018-01-14: 2 g via INTRAVENOUS

## 2018-01-14 MED ORDER — SODIUM CHLORIDE 0.9 % IV SOLN
INTRAVENOUS | Status: DC | PRN
  Administered 2018-01-14: 50 ug/min via INTRAVENOUS

## 2018-01-14 MED ORDER — ONDANSETRON HCL 4 MG/2ML IJ SOLN
INTRAMUSCULAR | Status: AC
  Filled 2018-01-14: qty 2

## 2018-01-14 MED ORDER — MIDAZOLAM HCL 2 MG/2ML IJ SOLN
INTRAMUSCULAR | Status: AC
  Filled 2018-01-14: qty 2

## 2018-01-14 MED ORDER — LACTATED RINGERS IV SOLN: INTRAVENOUS | Status: DC

## 2018-01-14 MED ORDER — PHENYLEPHRINE 40 MCG/ML (10ML) SYRINGE FOR IV PUSH (FOR BLOOD PRESSURE SUPPORT)
PREFILLED_SYRINGE | INTRAVENOUS | Status: DC | PRN
  Administered 2018-01-14 (×3): 80 ug via INTRAVENOUS

## 2018-01-14 MED ORDER — LIDOCAINE 2% (20 MG/ML) 5 ML SYRINGE
INTRAMUSCULAR | Status: AC
  Filled 2018-01-14: qty 5

## 2018-01-14 MED ORDER — OXYCODONE-ACETAMINOPHEN 5-325 MG PO TABS: 1.0000 | ORAL_TABLET | Freq: Four times a day (QID) | ORAL | 0 refills | Status: DC | PRN

## 2018-01-14 MED ORDER — FENTANYL CITRATE (PF) 100 MCG/2ML IJ SOLN
25.0000 ug | INTRAMUSCULAR | Status: DC | PRN
  Administered 2018-01-14 (×2): 50 ug via INTRAVENOUS

## 2018-01-14 MED ORDER — ONDANSETRON HCL 4 MG/2ML IJ SOLN: 4.0000 mg | Freq: Four times a day (QID) | INTRAMUSCULAR | Status: DC | PRN

## 2018-01-14 MED ORDER — ONDANSETRON HCL 4 MG PO TABS: 4.0000 mg | ORAL_TABLET | Freq: Three times a day (TID) | ORAL | 0 refills | Status: DC | PRN

## 2018-01-14 MED ORDER — OXYCODONE HCL 5 MG PO TABS: 5.0000 mg | ORAL_TABLET | Freq: Once | ORAL | Status: DC | PRN

## 2018-01-14 MED ORDER — PROPOFOL 10 MG/ML IV BOLUS
INTRAVENOUS | Status: DC | PRN
  Administered 2018-01-14: 200 mg via INTRAVENOUS
  Administered 2018-01-14 (×2): 50 mg via INTRAVENOUS

## 2018-01-14 MED ORDER — BUPIVACAINE HCL (PF) 0.5 % IJ SOLN
INTRAMUSCULAR | Status: AC
  Filled 2018-01-14: qty 30

## 2018-01-14 MED ORDER — BUPIVACAINE HCL (PF) 0.25 % IJ SOLN
INTRAMUSCULAR | Status: AC
  Filled 2018-01-14: qty 30

## 2018-01-14 MED ORDER — PROMETHAZINE HCL 25 MG PO TABS: 25.0000 mg | ORAL_TABLET | Freq: Four times a day (QID) | ORAL | 1 refills | Status: DC | PRN

## 2018-01-14 MED ORDER — CEFAZOLIN SODIUM-DEXTROSE 2-4 GM/100ML-% IV SOLN
INTRAVENOUS | Status: AC
  Filled 2018-01-14: qty 100

## 2018-01-14 MED ORDER — SCOPOLAMINE 1 MG/3DAYS TD PT72: 1.0000 | MEDICATED_PATCH | Freq: Once | TRANSDERMAL | Status: DC | PRN

## 2018-01-14 MED ORDER — OXYCODONE HCL 5 MG/5ML PO SOLN: 5.0000 mg | Freq: Once | ORAL | Status: DC | PRN

## 2018-01-14 MED ORDER — DEXAMETHASONE SODIUM PHOSPHATE 4 MG/ML IJ SOLN
INTRAMUSCULAR | Status: DC | PRN
  Administered 2018-01-14: 10 mg via INTRAVENOUS

## 2018-01-14 MED ORDER — DEXAMETHASONE SODIUM PHOSPHATE 10 MG/ML IJ SOLN
INTRAMUSCULAR | Status: AC
  Filled 2018-01-14: qty 1

## 2018-01-14 MED ORDER — COLCHICINE 0.6 MG PO TABS: 0.6000 mg | ORAL_TABLET | Freq: Two times a day (BID) | ORAL | 0 refills | Status: DC | PRN

## 2018-01-14 MED ORDER — FENTANYL CITRATE (PF) 100 MCG/2ML IJ SOLN
50.0000 ug | INTRAMUSCULAR | Status: AC | PRN
  Administered 2018-01-14: 25 ug via INTRAVENOUS
  Administered 2018-01-14 (×2): 50 ug via INTRAVENOUS
  Administered 2018-01-14: 25 ug via INTRAVENOUS

## 2018-01-14 SURGICAL SUPPLY — 74 items
BANDAGE ACE 6X5 VEL STRL LF (GAUZE/BANDAGES/DRESSINGS) ×4 IMPLANT
BANDAGE ESMARK 6X9 LF (GAUZE/BANDAGES/DRESSINGS) ×1 IMPLANT
BIT DRILL 5/64X5 DISP (BIT) ×2 IMPLANT
BLADE HEX COATED 2.75 (ELECTRODE) ×2 IMPLANT
BLADE SURG 10 STRL SS (BLADE) IMPLANT
BLADE SURG 15 STRL LF DISP TIS (BLADE) ×1 IMPLANT
BLADE SURG 15 STRL SS (BLADE) ×1
BNDG ESMARK 6X9 LF (GAUZE/BANDAGES/DRESSINGS) ×2
BNDG GAUZE ELAST 4 BULKY (GAUZE/BANDAGES/DRESSINGS) ×2 IMPLANT
BRUSH SCRUB EZ PLAIN DRY (MISCELLANEOUS) ×2 IMPLANT
CANISTER SUCT 1200ML W/VALVE (MISCELLANEOUS) IMPLANT
CLSR STERI-STRIP ANTIMIC 1/2X4 (GAUZE/BANDAGES/DRESSINGS) ×2 IMPLANT
COVER WAND RF STERILE (DRAPES) IMPLANT
CUFF TOURNIQUET SINGLE 34IN LL (TOURNIQUET CUFF) IMPLANT
DECANTER SPIKE VIAL GLASS SM (MISCELLANEOUS) IMPLANT
DRAPE EXTREMITY T 121X128X90 (DRAPE) ×2 IMPLANT
DRAPE SURG 17X23 STRL (DRAPES) ×4 IMPLANT
DRAPE U-SHAPE 47X51 STRL (DRAPES) ×2 IMPLANT
DURAPREP 26ML APPLICATOR (WOUND CARE) ×2 IMPLANT
ELECT REM PT RETURN 9FT ADLT (ELECTROSURGICAL) ×2
ELECTRODE REM PT RTRN 9FT ADLT (ELECTROSURGICAL) ×1 IMPLANT
GAUZE SPONGE 4X4 12PLY STRL (GAUZE/BANDAGES/DRESSINGS) ×2 IMPLANT
GAUZE XEROFORM 1X8 LF (GAUZE/BANDAGES/DRESSINGS) IMPLANT
GLOVE BIO SURGEON STRL SZ 6.5 (GLOVE) ×2 IMPLANT
GLOVE BIOGEL PI IND STRL 7.0 (GLOVE) ×2 IMPLANT
GLOVE BIOGEL PI INDICATOR 7.0 (GLOVE) ×2
GLOVE ECLIPSE 7.0 STRL STRAW (GLOVE) ×2 IMPLANT
GLOVE SKINSENSE NS SZ7.5 (GLOVE) ×1
GLOVE SKINSENSE STRL SZ7.5 (GLOVE) ×1 IMPLANT
GLOVE SURG SYN 7.5  E (GLOVE) ×1
GLOVE SURG SYN 7.5 E (GLOVE) ×1 IMPLANT
GOWN STRL REIN XL XLG (GOWN DISPOSABLE) ×2 IMPLANT
GOWN STRL REUS W/ TWL LRG LVL3 (GOWN DISPOSABLE) ×2 IMPLANT
GOWN STRL REUS W/ TWL XL LVL3 (GOWN DISPOSABLE) IMPLANT
GOWN STRL REUS W/TWL LRG LVL3 (GOWN DISPOSABLE) ×2
GOWN STRL REUS W/TWL XL LVL3 (GOWN DISPOSABLE)
IMMOBILIZER KNEE 22 UNIV (SOFTGOODS) IMPLANT
IMMOBILIZER KNEE 24 THIGH 36 (MISCELLANEOUS) IMPLANT
IMMOBILIZER KNEE 24 UNIV (MISCELLANEOUS)
K-WIRE .062X4 (WIRE) ×2 IMPLANT
KNEE WRAP E Z 3 GEL PACK (MISCELLANEOUS) ×2 IMPLANT
MANIFOLD NEPTUNE II (INSTRUMENTS) ×2 IMPLANT
NS IRRIG 1000ML POUR BTL (IV SOLUTION) ×6 IMPLANT
PACK ARTHROSCOPY DSU (CUSTOM PROCEDURE TRAY) ×2 IMPLANT
PACK BASIN DAY SURGERY FS (CUSTOM PROCEDURE TRAY) ×2 IMPLANT
PADDING CAST COTTON 6X4 STRL (CAST SUPPLIES) IMPLANT
PADDING CAST SYN 6 (CAST SUPPLIES)
PADDING CAST SYNTHETIC 4 (CAST SUPPLIES)
PADDING CAST SYNTHETIC 4X4 STR (CAST SUPPLIES) IMPLANT
PADDING CAST SYNTHETIC 6X4 NS (CAST SUPPLIES) IMPLANT
PENCIL BUTTON HOLSTER BLD 10FT (ELECTRODE) ×2 IMPLANT
RETRIEVER SUT HEWSON (MISCELLANEOUS) ×2 IMPLANT
SLEEVE SCD COMPRESS KNEE MED (MISCELLANEOUS) IMPLANT
SPONGE LAP 18X18 RF (DISPOSABLE) IMPLANT
SUCTION FRAZIER HANDLE 10FR (MISCELLANEOUS)
SUCTION TUBE FRAZIER 10FR DISP (MISCELLANEOUS) IMPLANT
SUT ETHIBOND 2 OS 4 DA (SUTURE) ×8 IMPLANT
SUT ETHILON 2 0 FS 18 (SUTURE) ×8 IMPLANT
SUT FIBERWIRE #2 38 T-5 BLUE (SUTURE)
SUT FIBERWIRE #5 38 CONV NDL (SUTURE)
SUT MNCRL AB 3-0 PS2 18 (SUTURE) IMPLANT
SUT VIC AB 0 CT1 27 (SUTURE)
SUT VIC AB 0 CT1 27XBRD ANBCTR (SUTURE) IMPLANT
SUT VIC AB 2-0 CT1 27 (SUTURE) ×4
SUT VIC AB 2-0 CT1 TAPERPNT 27 (SUTURE) ×4 IMPLANT
SUTURE FIBERWR #2 38 T-5 BLUE (SUTURE) IMPLANT
SUTURE FIBERWR #5 38 CONV NDL (SUTURE) IMPLANT
SUTURE TAPE 1.3 40 TPR END (SUTURE) IMPLANT
SUTURETAPE 1.3 40 TPR END (SUTURE)
SYR BULB 3OZ (MISCELLANEOUS) IMPLANT
TOWEL GREEN STERILE FF (TOWEL DISPOSABLE) ×2 IMPLANT
TOWEL OR NON WOVEN STRL DISP B (DISPOSABLE) ×2 IMPLANT
TRAY DSU PREP LF (CUSTOM PROCEDURE TRAY) ×2 IMPLANT
YANKAUER SUCT BULB TIP NO VENT (SUCTIONS) ×2 IMPLANT

## 2018-01-14 NOTE — Anesthesia Procedure Notes (Signed)
Anesthesia Regional Block: Adductor canal block   Pre-Anesthetic Checklist: ,, timeout performed, Correct Patient, Correct Site, Correct Laterality, Correct Procedure, Correct Position, site marked, Risks and benefits discussed,  Surgical consent,  Pre-op evaluation,  At surgeon's request and post-op pain management  Laterality: Right  Prep: chloraprep       Needles:  Injection technique: Single-shot  Needle Type: Echogenic Needle     Needle Length: 9cm  Needle Gauge: 21     Additional Needles:   Narrative:  Start time: 01/14/2018 1:30 PM End time: 01/14/2018 1:38 PM Injection made incrementally with aspirations every 5 mL.  Performed by: Personally  Anesthesiologist: Albertha Ghee, MD  Additional Notes: Pt tolerated the procedure well.

## 2018-01-14 NOTE — Op Note (Signed)
   Date of Surgery: 01/14/2018  INDICATIONS: Adam Gonzales is a 72 y.o.-year-old male with a right quadriceps rupture;  The patient did consent to the procedure after discussion of the risks and benefits.  PREOPERATIVE DIAGNOSIS: Right quadriceps rupture with medial and lateral retinaculum disruption  POSTOPERATIVE DIAGNOSIS: Same.  PROCEDURE: Reattachment of right quadriceps tendon to patella through bony tunnels  SURGEON: N. Eduard Roux, M.D.  ASSIST: Ciro Backer [place], [place]; necessary for the timely completion of procedure and due to complexity of procedure.  ANESTHESIA:  general, regional  IV FLUIDS AND URINE: See anesthesia.  ESTIMATED BLOOD LOSS: minimal mL.  IMPLANTS: none  DRAINS: none  COMPLICATIONS: None.  DESCRIPTION OF PROCEDURE: The patient was brought to the operating room and placed supine on the operating table.  The patient had been signed prior to the procedure and this was documented. The patient had the anesthesia placed by the anesthesiologist.  A time-out was performed to confirm that this was the correct patient, site, side and location. The patient did receive antibiotics prior to the incision and was re-dosed during the procedure as needed at indicated intervals.  A tourniquet was placed.  The patient had the operative extremity prepped and draped in the standard surgical fashion.    A midline incision was made directly over the anterior aspect of the knee.  Full-thickness flaps were elevated.  The peritenon was sharply incised and elevated both medially and laterally.  Large amount of hematoma was encountered.  I was able to identify the full thickness rupture of the quadriceps tendon which extended both into the medial and lateral retinaculum.  The knee joint was then thoroughly lavaged.  The end of the quadriceps tendon was cleaned up.  The superior pole of the patella was prepared back to bleeding bony surface.  I then ran 4 parallel strands of #2 Ethibond  into the quadriceps tendon in a Krakw fashion.  I then drilled 3 parallel bony tunnels through the patella and passed the sutures.  The knee was then placed in full extension and the sutures were then tied.  There was good approximation of the quadriceps tendon to the patella.  The retinaculum rupture was repaired using interrupted #2 Ethibond.  The subcutaneous tissue was closed with 2-0 Vicryl.  The skin was closed with 3-0 nylon.  Sterile dressings were applied.  The leg was placed in a Bledsoe brace.  He tolerated procedure well had no immediate complications.  POSTOPERATIVE PLAN: Weight-bear as tolerated in Bledsoe brace.  Follow-up in 2 weeks for suture removal.  [name], MD [place] 10:32 PM

## 2018-01-14 NOTE — Anesthesia Procedure Notes (Signed)
Procedure Name: LMA Insertion Date/Time: 01/14/2018 2:15 PM Performed by: Lyndee Leo, CRNA Pre-anesthesia Checklist: Patient identified, Emergency Drugs available, Suction available and Patient being monitored Patient Re-evaluated:Patient Re-evaluated prior to induction Oxygen Delivery Method: Circle system utilized Preoxygenation: Pre-oxygenation with 100% oxygen Induction Type: IV induction Ventilation: Mask ventilation without difficulty LMA: LMA inserted LMA Size: 5.0 Number of attempts: 2 Airway Equipment and Method: Bite block Placement Confirmation: positive ETCO2 Tube secured with: Tape Dental Injury: Teeth and Oropharynx as per pre-operative assessment

## 2018-01-14 NOTE — Progress Notes (Signed)
Assisted Dr. Hodierne with right, ultrasound guided, adductor canal block. Side rails up, monitors on throughout procedure. See vital signs in flow sheet. Tolerated Procedure well.  

## 2018-01-14 NOTE — Anesthesia Preprocedure Evaluation (Signed)
Anesthesia Evaluation  Patient identified by MRN, date of birth, ID band Patient awake    Reviewed: Allergy & Precautions, H&P , NPO status , Patient's Chart, lab work & pertinent test results  Airway Mallampati: II   Neck ROM: full    Dental   Pulmonary neg pulmonary ROS,    breath sounds clear to auscultation       Cardiovascular hypertension,  Rhythm:regular Rate:Normal     Neuro/Psych    GI/Hepatic   Endo/Other    Renal/GU      Musculoskeletal  (+) Arthritis ,   Abdominal   Peds  Hematology   Anesthesia Other Findings   Reproductive/Obstetrics                             Anesthesia Physical Anesthesia Plan  ASA: II  Anesthesia Plan: General   Post-op Pain Management:  Regional for Post-op pain   Induction: Intravenous  PONV Risk Score and Plan: 2 and Ondansetron, Dexamethasone and Treatment may vary due to age or medical condition  Airway Management Planned: LMA  Additional Equipment:   Intra-op Plan:   Post-operative Plan:   Informed Consent: I have reviewed the patients History and Physical, chart, labs and discussed the procedure including the risks, benefits and alternatives for the proposed anesthesia with the patient or authorized representative who has indicated his/her understanding and acceptance.     Plan Discussed with: CRNA, Anesthesiologist and Surgeon  Anesthesia Plan Comments:         Anesthesia Quick Evaluation

## 2018-01-14 NOTE — Transfer of Care (Signed)
Immediate Anesthesia Transfer of Care Note  Patient: Adam Gonzales  Procedure(s) Performed: REPAIR RIGHT QUADRICEPS TENDON (Right Knee)  Patient Location: PACU  Anesthesia Type:GA combined with regional for post-op pain  Level of Consciousness: awake, sedated and patient cooperative  Airway & Oxygen Therapy: Patient Spontanous Breathing and Patient connected to face mask oxygen  Post-op Assessment: Report given to RN and Post -op Vital signs reviewed and stable  Post vital signs: Reviewed and stable  Last Vitals:  Vitals Value Taken Time  BP 120/55 01/14/2018  3:47 PM  Temp    Pulse 69 01/14/2018  3:49 PM  Resp 16 01/14/2018  3:49 PM  SpO2 99 % 01/14/2018  3:49 PM  Vitals shown include unvalidated device data.  Last Pain:  Vitals:   01/14/18 1307  TempSrc: Oral  PainSc: 2       Patients Stated Pain Goal: 3 (79/98/72 1587)  Complications: No apparent anesthesia complications

## 2018-01-14 NOTE — H&P (Signed)
PREOPERATIVE H&P  Chief Complaint: right quad tendon rupture  HPI: Adam Gonzales is a 72 y.o. male who presents for surgical treatment of right quad tendon rupture.  He denies any changes in medical history.  Past Medical History:  Diagnosis Date  . 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 Gout   . Hypertension   . Quadriceps tendon rupture, right, initial encounter    Past Surgical History:  Procedure Laterality Date  . CYST EXCISION    . HYDROCELE EXCISION Right    Social History   Socioeconomic History  . Marital status: Married    Spouse name: Not on file  . Number of children: Not on file  . Years of education: Not on file  . Highest education level: Not on file  Occupational History  . Not on file  Social Needs  . Financial resource strain: Not on file  . Food insecurity:    Worry: Not on file    Inability: Not on file  . Transportation needs:    Medical: Not on file    Non-medical: Not on file  Tobacco Use  . Smoking status: Never Smoker  . Smokeless tobacco: Never Used  Substance and Sexual Activity  . Alcohol use: Yes    Comment: social  . Drug use: No  . Sexual activity: Not on file  Lifestyle  . Physical activity:    Days per week: Not on file    Minutes per session: Not on file  . Stress: Not on file  Relationships  . Social connections:    Talks on phone: Not on file    Gets together: Not on file    Attends religious service: Not on file    Active member of club or organization: Not on file    Attends meetings of clubs or organizations: Not on file    Relationship status: Not on file  Other Topics Concern  . Not on file  Social History Narrative  . Not on file   Family History  Problem Relation Age of Onset  . Heart failure Mother   . Cancer - Colon Father   . Dementia Sister   . Cancer Brother        throat   No Known Allergies Prior to Admission medications   Medication Sig Start Date End Date Taking? Authorizing Provider    aspirin EC 81 MG tablet Take 81 mg by mouth daily.   Yes [provider]  finasteride (PROSCAR) 5 MG tablet Take 5 mg by mouth daily. 03/12/17  Yes [provider]  ibuprofen (ADVIL,MOTRIN) 200 MG tablet Take 400 mg by mouth daily as needed for moderate pain.   Yes [provider]  lisinopril (PRINIVIL,ZESTRIL) 40 MG tablet Take 40 mg by mouth daily. 02/15/17  Yes [provider]  losartan (COZAAR) 25 MG tablet Take 25 mg by mouth 2 (two) times daily.   Yes [provider]  oxyCODONE (ROXICODONE) 5 MG immediate release tablet Take 1 tablet (5 mg total) by mouth every 4 (four) hours as needed for severe pain. 01/05/18  Yes Maudie Flakes, MD  spironolactone (ALDACTONE) 25 MG tablet Take 25 mg by mouth daily.   Yes [provider]  Tamsulosin HCl (FLOMAX) 0.4 MG CAPS Take 0.4 mg by mouth daily.    Yes [provider]     Positive ROS: All other systems have been reviewed and were otherwise negative with the exception of those mentioned in the HPI and  as above.  Physical Exam: General: Alert, no acute distress Cardiovascular: No pedal edema Respiratory: No cyanosis, no use of accessory musculature GI: abdomen soft Skin: No lesions in the area of chief complaint Neurologic: Sensation intact distally Psychiatric: Patient is competent for consent with normal mood and affect Lymphatic: no lymphedema  MUSCULOSKELETAL: exam stable  Assessment: right quad tendon rupture  Plan: Plan for Procedure(s): REPAIR RIGHT QUADRICEPS TENDON  The risks benefits and alternatives were discussed with the patient including but not limited to the risks of nonoperative treatment, versus surgical intervention including infection, bleeding, nerve injury,  blood clots, cardiopulmonary complications, morbidity, mortality, among others, and they were willing to proceed.   Eduard Roux, MD   01/14/2018 12:28 PM

## 2018-01-14 NOTE — Discharge Instructions (Signed)
Post Anesthesia Home Care Instructions  Activity: Get plenty of rest for the remainder of the day. A responsible individual must stay with you for 24 hours following the procedure.  For the next 24 hours, DO NOT: -Drive a car -Paediatric nurse -Drink alcoholic beverages -Take any medication unless instructed by your physician -Make any legal decisions or sign important papers.  Meals: Start with liquid foods such as gelatin or soup. Progress to regular foods as tolerated. Avoid greasy, spicy, heavy foods. If nausea and/or vomiting occur, drink only clear liquids until the nausea and/or vomiting subsides. Call your physician if vomiting continues.  Special Instructions/Symptoms: Your throat may feel dry or sore from the anesthesia or the breathing tube placed in your throat during surgery. If this causes discomfort, gargle with warm salt water. The discomfort should disappear within 24 hours.  If you had a scopolamine patch placed behind your ear for the management of post- operative nausea and/or vomiting:  1. The medication in the patch is effective for 72 hours, after which it should be removed.  Wrap patch in a tissue and discard in the trash. Wash hands thoroughly with soap and water. 2. You may remove the patch earlier than 72 hours if you experience unpleasant side effects which may include dry mouth, dizziness or visual disturbances. 3. Avoid touching the patch. Wash your hands with soap and water after contact with the patch.     Regional Anesthesia Blocks  1. Numbness or the inability to move the "blocked" extremity may last from 3-48 hours after placement. The length of time depends on the medication injected and your individual response to the medication. If the numbness is not going away after 48 hours, call your surgeon.  2. The extremity that is blocked will need to be protected until the numbness is gone and the  Strength has returned. Because you cannot feel it, you  will need to take extra care to avoid injury. Because it may be weak, you may have difficulty moving it or using it. You may not know what position it is in without looking at it while the block is in effect.  3. For blocks in the legs and feet, returning to weight bearing and walking needs to be done carefully. You will need to wait until the numbness is entirely gone and the strength has returned. You should be able to move your leg and foot normally before you try and bear weight or walk. You will need someone to be with you when you first try to ensure you do not fall and possibly risk injury.  4. Bruising and tenderness at the needle site are common side effects and will resolve in a few days.  5. Persistent numbness or new problems with movement should be communicated to the surgeon or the [facility] [phone] [place] [phone]).   Postoperative instructions:  Weightbearing instructions: as tolerated in brace  Keep your dressing and/or splint clean and dry at all times.  You can remove your dressing on post-operative day #3 and change with a dry/sterile dressing or Band-Aids as needed thereafter.    Incision instructions:  Do not soak your incision for 3 weeks after surgery.  If the incision gets wet, pat dry and do not scrub the incision.  Pain control:  You have been given a prescription to be taken as directed for post-operative pain control.  In addition, elevate the operative extremity above the heart at all times to prevent  swelling and throbbing pain.  Take over-the-counter Colace, 100mg  by mouth twice a day while taking narcotic pain medications to help prevent constipation.  Follow up appointments: 1) 12-14 days for suture removal and wound check. 2) Dr. Erlinda Hong as scheduled.   -------------------------------------------------------------------------------------------------------------  After Surgery Pain Control:  After your surgery,  post-surgical discomfort or pain is likely. This discomfort can last several days to a few weeks. At certain times of the day your discomfort may be more intense.  Did you receive a nerve block?  A nerve block can provide pain relief for one hour to two days after your surgery. As long as the nerve block is working, you will experience little or no sensation in the area the surgeon operated on.  As the nerve block wears off, you will begin to experience pain or discomfort. It is very important that you begin taking your prescribed pain medication before the nerve block fully wears off. Treating your pain at the first sign of the block wearing off will ensure your pain is better controlled and more tolerable when full-sensation returns. Do not wait until the pain is intolerable, as the medicine will be less effective. It is better to treat pain in advance than to try and catch up.  General Anesthesia:  If you did not receive a nerve block during your surgery, you will need to start taking your pain medication shortly after your surgery and should continue to do so as prescribed by your surgeon.  Pain Medication:  Most commonly we prescribe Vicodin and Percocet for post-operative pain. Both of these medications contain a combination of acetaminophen (Tylenol) and a narcotic to help control pain.   It takes between 30 and 45 minutes before pain medication starts to work. It is important to take your medication before your pain level gets too intense.   Nausea is a common side effect of many pain medications. You will want to eat something before taking your pain medicine to help prevent nausea.   If you are taking a prescription pain medication that contains acetaminophen, we recommend that you do not take additional over the counter acetaminophen (Tylenol).  Other pain relieving options:   Using a cold pack to ice the affected area a few times a day (15 to 20 minutes at a time) can help to relieve  pain, reduce swelling and bruising.   Elevation of the affected area can also help to reduce pain and swelling.

## 2018-01-15 ENCOUNTER — Other Ambulatory Visit (INDEPENDENT_AMBULATORY_CARE_PROVIDER_SITE_OTHER): Payer: Self-pay

## 2018-01-15 ENCOUNTER — Encounter (HOSPITAL_BASED_OUTPATIENT_CLINIC_OR_DEPARTMENT_OTHER): Payer: Self-pay | Admitting: Orthopaedic Surgery

## 2018-01-15 ENCOUNTER — Telehealth (INDEPENDENT_AMBULATORY_CARE_PROVIDER_SITE_OTHER): Payer: Self-pay | Admitting: Orthopaedic Surgery

## 2018-01-15 MED ORDER — DIAZEPAM 5 MG PO TABS: 5.0000 mg | ORAL_TABLET | Freq: Four times a day (QID) | ORAL | 0 refills | Status: DC | PRN

## 2018-01-15 NOTE — Telephone Encounter (Signed)
Valium 5 mg q6h prn anxiety.  #30

## 2018-01-15 NOTE — Telephone Encounter (Signed)
Patient's wife Jan called asked if patient can get something to help him relax. Jan said patient is in a great deal of pain and is very tense. Jan said he does not want to take a muscle relaxer because he think his muscles would be to weak to move around. The number to contact Jan is [phone]

## 2018-01-15 NOTE — Telephone Encounter (Signed)
Please advise 

## 2018-01-15 NOTE — Telephone Encounter (Signed)
Called into pharmacy, patient wife aware I did so

## 2018-01-16 ENCOUNTER — Encounter (HOSPITAL_BASED_OUTPATIENT_CLINIC_OR_DEPARTMENT_OTHER): Payer: Self-pay | Admitting: Orthopaedic Surgery

## 2018-01-16 NOTE — Anesthesia Postprocedure Evaluation (Signed)
Anesthesia Post Note  Patient: Adam Gonzales  Procedure(s) Performed: REPAIR RIGHT QUADRICEPS TENDON (Right Knee)     Patient location during evaluation: PACU Anesthesia Type: General and Regional Level of consciousness: awake and alert Pain management: pain level controlled Vital Signs Assessment: post-procedure vital signs reviewed and stable Respiratory status: spontaneous breathing, nonlabored ventilation, respiratory function stable and patient connected to nasal cannula oxygen Cardiovascular status: blood pressure returned to baseline and stable Postop Assessment: no apparent nausea or vomiting Anesthetic complications: no    Last Vitals:  Vitals:   01/14/18 1645 01/14/18 1745  BP: (!) 142/78 (!) 161/85  Pulse: 72 79  Resp: 15 16  Temp:  36.7 C  SpO2: 95% 99%    Last Pain:  Vitals:   01/14/18 1745  TempSrc:   PainSc: 3                  Jazelyn Sipe

## 2018-01-17 ENCOUNTER — Encounter (INDEPENDENT_AMBULATORY_CARE_PROVIDER_SITE_OTHER): Payer: Self-pay | Admitting: Orthopaedic Surgery

## 2018-01-17 NOTE — Telephone Encounter (Signed)
Please set up home health PT for him.  Thanks.

## 2018-01-20 ENCOUNTER — Other Ambulatory Visit (HOSPITAL_BASED_OUTPATIENT_CLINIC_OR_DEPARTMENT_OTHER): Payer: Self-pay | Admitting: Orthopaedic Surgery

## 2018-01-21 DIAGNOSIS — N400 Enlarged prostate without lower urinary tract symptoms: Secondary | ICD-10-CM | POA: Diagnosis not present

## 2018-01-21 DIAGNOSIS — M66861 Spontaneous rupture of other tendons, right lower leg: Secondary | ICD-10-CM | POA: Diagnosis not present

## 2018-01-21 DIAGNOSIS — E785 Hyperlipidemia, unspecified: Secondary | ICD-10-CM | POA: Diagnosis not present

## 2018-01-21 DIAGNOSIS — I119 Hypertensive heart disease without heart failure: Secondary | ICD-10-CM | POA: Diagnosis not present

## 2018-01-21 NOTE — Telephone Encounter (Signed)
yes

## 2018-01-22 ENCOUNTER — Telehealth (INDEPENDENT_AMBULATORY_CARE_PROVIDER_SITE_OTHER): Payer: Self-pay | Admitting: Orthopaedic Surgery

## 2018-01-22 MED ORDER — HYDROCODONE-ACETAMINOPHEN 5-325 MG PO TABS: 1.0000 | ORAL_TABLET | Freq: Four times a day (QID) | ORAL | 0 refills | Status: DC | PRN

## 2018-01-22 NOTE — Telephone Encounter (Signed)
I sent it in 

## 2018-01-22 NOTE — Telephone Encounter (Signed)
See message.

## 2018-01-22 NOTE — Telephone Encounter (Signed)
yes

## 2018-01-22 NOTE — Telephone Encounter (Signed)
See message below °

## 2018-01-22 NOTE — Telephone Encounter (Signed)
Medication refill  Hydrocodone- acetaminophen(Norco/Vicodin) 5-325mg  tablet

## 2018-01-22 NOTE — Telephone Encounter (Signed)
Sheryn Bison -(PT) from [facility] called left voicemail message advising he did eval for HHPT yesterday with patient. Harrell Gave advised he need verbal orders for 2 Wk 2 and 1 Wk 1. He have question concerning range of motion, weight baring precautions. Harrell Gave said  He need to know if it's ok to begin isometric quad strengthening,hip strenghthening, supine exercises, some lower level basic stuff. The number to contact Harrell Gave is [phone]

## 2018-01-23 NOTE — Telephone Encounter (Signed)
Called patient to advise on message.  

## 2018-01-23 NOTE — Telephone Encounter (Signed)
He may do ROM 0-30 within his brace.  May weight bear as tolerated in the brace.

## 2018-01-23 NOTE — Telephone Encounter (Signed)
He has questions concerning range of motion, weight baring precautions?  What is the WB status?

## 2018-01-23 NOTE — Telephone Encounter (Signed)
Called patient no answer LMOM to return my call.  

## 2018-01-24 ENCOUNTER — Telehealth (INDEPENDENT_AMBULATORY_CARE_PROVIDER_SITE_OTHER): Payer: Self-pay | Admitting: Orthopaedic Surgery

## 2018-01-24 NOTE — Telephone Encounter (Signed)
See message below °

## 2018-01-24 NOTE — Telephone Encounter (Signed)
Christopher/PT/[facility] returning your call.  Please call him back. [phone]

## 2018-01-24 NOTE — Telephone Encounter (Signed)
See previous message

## 2018-01-24 NOTE — Telephone Encounter (Signed)
Patient's wife called in regards to the forms that were filled out for her to be able to take care of the patient since he had surgery.  The FMLA that was filled out stated that he needs care every day from 1 - 4 hours, she stated that there are days he needs care for greater than 4 hours with three days a week it's up to 12 hours.  She stated that [employer] needs this corrected by the end of today.  CB#[phone].  Thank you.

## 2018-01-24 NOTE — Telephone Encounter (Signed)
Called Gerald Stabs back to advise on message below.

## 2018-01-28 ENCOUNTER — Other Ambulatory Visit (HOSPITAL_BASED_OUTPATIENT_CLINIC_OR_DEPARTMENT_OTHER): Payer: Self-pay | Admitting: Orthopaedic Surgery

## 2018-01-28 NOTE — Telephone Encounter (Signed)
yes

## 2018-01-28 NOTE — Telephone Encounter (Signed)
Ok for refill? 

## 2018-01-31 ENCOUNTER — Encounter (INDEPENDENT_AMBULATORY_CARE_PROVIDER_SITE_OTHER): Payer: Self-pay | Admitting: Orthopaedic Surgery

## 2018-01-31 ENCOUNTER — Ambulatory Visit (INDEPENDENT_AMBULATORY_CARE_PROVIDER_SITE_OTHER): Payer: Medicare Other | Admitting: Orthopaedic Surgery

## 2018-01-31 DIAGNOSIS — S76111A Strain of right quadriceps muscle, fascia and tendon, initial encounter: Secondary | ICD-10-CM

## 2018-01-31 MED ORDER — HYDROCODONE-ACETAMINOPHEN 5-325 MG PO TABS: 1.0000 | ORAL_TABLET | Freq: Every day | ORAL | 0 refills | Status: DC | PRN

## 2018-01-31 NOTE — Progress Notes (Signed)
   Post-Op Visit Note   Patient: Adam Gonzales           Date of Birth: 1945-06-28           MRN: [id number] Visit Date: 01/31/2018 PCP: Levin Erp, MD   Assessment & Plan:  Chief Complaint:  Chief Complaint  Patient presents with  . Right Leg - Pain   Visit Diagnoses:  1. Rupture of right quadriceps muscle, initial encounter     Plan: Adam Gonzales is two-week status post right quadriceps repair.  He is overall doing well.  He takes 2 hydrocodone's a day for pain.  He is doing physical therapy to work on hip flexor strength.  His surgical incision is healed.  He denies any significant pain.  We remove the sutures today and placed a new strips.  He is to continue to wear his Bledsoe brace from 0 to 30 degrees.  Hydrocodone was refilled today.  Recheck in 4 weeks.  Follow-Up Instructions: Return in about 4 weeks (around 02/28/2018).   Orders:  No orders of the defined types were placed in this encounter.  Meds ordered this encounter  Medications  . HYDROcodone-acetaminophen (NORCO) 5-325 MG tablet    Sig: Take 1-2 tablets by mouth daily as needed.    Dispense:  30 tablet    Refill:  0    Imaging: No results found.  PMFS History: Patient Active Problem List   Diagnosis Date Noted  . Rupture of right quadriceps tendon 01/14/2018  . 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 HTN (hypertension) 01/02/2018  . Acute monoarthritis 01/01/2018  . 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 Gout   . Hypertension   . Quadriceps tendon rupture, right, initial encounter     Family History  Problem Relation Age of Onset  . Heart failure Mother   . Cancer - Colon Father   . Dementia Sister   . Cancer Brother        throat    Past Surgical History:  Procedure Laterality Date  . CYST EXCISION    . HYDROCELE EXCISION Right   . QUADRICEPS TENDON REPAIR Right 01/14/2018   Procedure: REPAIR RIGHT QUADRICEPS TENDON;  Surgeon: Leandrew Koyanagi, MD;  Location: [facility];  Service: Orthopedics;  Laterality: Right;   Social History   Occupational History  . Not on file  Tobacco Use  . Smoking status: Never Smoker  . Smokeless tobacco: Never Used  Substance and Sexual Activity  . Alcohol use: Yes    Comment: social  . Drug use: No  . Sexual activity: Not on file

## 2018-02-07 ENCOUNTER — Other Ambulatory Visit (HOSPITAL_BASED_OUTPATIENT_CLINIC_OR_DEPARTMENT_OTHER): Payer: Self-pay | Admitting: Orthopaedic Surgery

## 2018-02-28 ENCOUNTER — Encounter (INDEPENDENT_AMBULATORY_CARE_PROVIDER_SITE_OTHER): Payer: Self-pay | Admitting: Orthopaedic Surgery

## 2018-02-28 ENCOUNTER — Ambulatory Visit (INDEPENDENT_AMBULATORY_CARE_PROVIDER_SITE_OTHER): Payer: Medicare Other | Admitting: Orthopaedic Surgery

## 2018-02-28 DIAGNOSIS — S76111A Strain of right quadriceps muscle, fascia and tendon, initial encounter: Secondary | ICD-10-CM

## 2018-02-28 NOTE — Progress Notes (Signed)
   Post-Op Visit Note   Patient: Adam Gonzales           Date of Birth: [birth year]           MRN: [id number] Visit Date: 02/28/2018 PCP: Levin Erp, MD   Assessment & Plan:  Chief Complaint:  Chief Complaint  Patient presents with  . Right Knee - Pain   Visit Diagnoses:  1. Rupture of right quadriceps muscle, initial encounter     Plan: Shahan is 6 weeks status post right quadriceps repair.  He reports no pain.  He has been doing home exercises and home health physical therapy.  He is coming along.  His surgical scar is fully healed.  His range of motion is coming along from 5 to 75 degrees.  At this point we will make a referral to outpatient physical therapy for continued strengthening and gait training and range of motion.  He is to wean the Bledsoe brace as tolerated.  Follow-up in 6 weeks for recheck.  He needs to remain out of work until then.  Follow-Up Instructions: Return in about 6 weeks (around 04/11/2018).   Orders:  No orders of the defined types were placed in this encounter.  No orders of the defined types were placed in this encounter.   Imaging: No results found.  PMFS History: Patient Active Problem List   Diagnosis Date Noted  . Rupture of right quadriceps tendon 01/14/2018  . 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 HTN (hypertension) 01/02/2018  . Acute monoarthritis 01/01/2018  . 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 Gout   . Hypertension   . Quadriceps tendon rupture, right, initial encounter     Family History  Problem Relation Age of Onset  . Heart failure Mother   . Cancer - Colon Father   . Dementia Sister   . Cancer Brother        throat    Past Surgical History:  Procedure Laterality Date  . CYST EXCISION    . HYDROCELE EXCISION Right   . QUADRICEPS TENDON REPAIR Right 01/14/2018   Procedure: REPAIR RIGHT QUADRICEPS  TENDON;  Surgeon: Leandrew Koyanagi, MD;  Location: [facility];  Service: Orthopedics;  Laterality: Right;   Social History   Occupational History  . Not on file  Tobacco Use  . Smoking status: Never Smoker  . Smokeless tobacco: Never Used  Substance and Sexual Activity  . Alcohol use: Yes    Comment: social  . Drug use: No  . Sexual activity: Not on file

## 2018-03-01 ENCOUNTER — Telehealth (INDEPENDENT_AMBULATORY_CARE_PROVIDER_SITE_OTHER): Payer: Self-pay | Admitting: Orthopaedic Surgery

## 2018-03-01 ENCOUNTER — Telehealth (INDEPENDENT_AMBULATORY_CARE_PROVIDER_SITE_OTHER): Payer: Self-pay

## 2018-03-01 NOTE — Telephone Encounter (Signed)
Called no answer LMOM to return our call

## 2018-03-01 NOTE — Telephone Encounter (Signed)
I faxed referral to PT hand and specialist per last message. Need to discuss which PT they prefer.

## 2018-03-01 NOTE — Telephone Encounter (Signed)
Patient's wife called stating that [facility] has not received the information for the referral in order to get his initial appointment scheduled.  CB#[phone].  Thank you.

## 2018-03-01 NOTE — Telephone Encounter (Signed)
FAXED

## 2018-03-01 NOTE — Telephone Encounter (Signed)
Jarrett Soho from [facility] called asking if we can fax over note, PT Rx, demo and insurance info Fax: [phone]

## 2018-03-04 NOTE — Telephone Encounter (Signed)
Called Jarrett Soho to check to see if patient was sch at [facility]. He is scheduled 03/06/2018-first appt.

## 2018-03-06 ENCOUNTER — Encounter (INDEPENDENT_AMBULATORY_CARE_PROVIDER_SITE_OTHER): Payer: Self-pay | Admitting: Orthopaedic Surgery

## 2018-03-07 ENCOUNTER — Encounter: Payer: Self-pay | Admitting: Physical Therapy

## 2018-03-07 ENCOUNTER — Ambulatory Visit: Payer: Medicare Other | Attending: Orthopaedic Surgery | Admitting: Physical Therapy

## 2018-03-07 DIAGNOSIS — R2689 Other abnormalities of gait and mobility: Secondary | ICD-10-CM | POA: Insufficient documentation

## 2018-03-07 DIAGNOSIS — M25561 Pain in right knee: Secondary | ICD-10-CM | POA: Insufficient documentation

## 2018-03-07 DIAGNOSIS — G8929 Other chronic pain: Secondary | ICD-10-CM | POA: Diagnosis not present

## 2018-03-07 DIAGNOSIS — R600 Localized edema: Secondary | ICD-10-CM | POA: Insufficient documentation

## 2018-03-07 DIAGNOSIS — M25661 Stiffness of right knee, not elsewhere classified: Secondary | ICD-10-CM | POA: Diagnosis not present

## 2018-03-07 NOTE — Therapy (Signed)
[place], [place], [address] Phone: [phone]   Fax:  [phone]  Physical Therapy Evaluation  Patient Details  Name: Adam Gonzales MRN: [id number] Date of Birth: 11-07-1945 Referring Provider (PT): Dr Frankey Shown    Encounter Date: 03/07/2018  PT End of Session - 03/07/18 1518    Visit Number  1    Number of Visits  16    Date for PT Re-Evaluation  05/02/18    Authorization Type  BCBS Medicare     PT Start Time  6283    PT Stop Time  1458    PT Time Calculation (min)  43 min    Activity Tolerance  Patient tolerated treatment well    Behavior During Therapy  [facility] for tasks assessed/performed       Past Medical History:  Diagnosis Date  . 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 Gout   . Hypertension   . Quadriceps tendon rupture, right, initial encounter     Past Surgical History:  Procedure Laterality Date  . CYST EXCISION    . HYDROCELE EXCISION Right   . QUADRICEPS TENDON REPAIR Right 01/14/2018   Procedure: REPAIR RIGHT QUADRICEPS TENDON;  Surgeon: Leandrew Koyanagi, MD;  Location: [place];  Service: Orthopedics;  Laterality: Right;    There were no vitals filed for this visit.   Subjective Assessment - 03/07/18 1426    Subjective  Patient had a right quarucpes tendon repair on 01/15/2019. Patient is having no pain. he is walking with a walker. He is haiving some trouble with his brace sliding down therapy will re-adjust. He has been working on his home exercise program.     Patient is accompained by:  Family member   wife   Pertinent History  Gout     Limitations  Sitting    How long can you stand comfortably?  limited compared to baseline     How long can you walk comfortably?  limited household distance     Diagnostic tests  nothing post op    Patient Stated Goals  to get back to the gym and work     Currently in Pain?  No/denies   just tightness    Pain Score  --          [facility] PT Assessment - 03/07/18 0001      Assessment   Medical Diagnosis  Right Knee Quadruceps tendon repair    Referring Provider (PT)  Dr Frankey Shown     Onset Date/Surgical Date  01/15/19    Hand Dominance  Right    Next MD Visit  March 4th     Prior Therapy  None       Precautions   Precautions  Knee    Precaution Comments  follow patellar tendon protocol       Restrictions   Weight Bearing Restrictions  No      Balance Screen   Has the patient fallen in the past 6 months  No    Has the patient had a decrease in activity level because of a fear of falling?   No    Is the patient reluctant to leave their home because of a fear of falling?   No      Home Environment   Additional Comments  Stairs going in and out 3 with 1 rail.       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[employer]; cutting the grass       Cognition   Overall Cognitive Status  Within Functional Limits for tasks assessed    Attention  Focused    Focused Attention  Appears intact    Memory  Appears intact    Awareness  Appears intact    Problem Solving  Appears intact      Observation/Other Assessments   Skin Integrity  well healed surgical wound       Sensation   Light Touch  Appears Intact    Additional Comments  denies parathesias       Coordination   Gross Motor Movements are Fluid and Coordinated  Yes    Fine Motor Movements are Fluid and Coordinated  Yes      Posture/Postural Control   Posture Comments  good       ROM / Strength   AROM / PROM / Strength  PROM;AROM;Strength      AROM   AROM Assessment Site  Knee    Right/Left Knee  Right    Right Knee Extension  88      PROM   PROM Assessment Site  Knee    Right/Left Knee  Right    Right Knee Extension  92      Strength   Overall Strength Comments  able to perfrom knee extension adn hip flexion v garivty and can  take light resistance. No offical MMT performed      Palpation   Patella mobility  mild limitations of inferior/superior movement     Palpation comment  no unexpected tenderness to palpation       Ambulation/Gait   Gait Comments  decreased single leg stance time on the right; flexed trunk with ambualtion. Can correct with cuing.                 Objective measurements completed on examination: See above findings.      [place] Adult PT Treatment/Exercise - 03/07/18 0001      Exercises   Exercises  Knee/Hip      Knee/Hip Exercises: Standing   Other Standing Knee Exercises  standing march x10 with cuing for UE assist       Knee/Hip Exercises: Supine   Other Supine Knee/Hip Exercises  AAROM heel slide with strapcuing not to over stretch at end range              PT Education - 03/07/18 1518    Education Details  HEP; reviewed the protocol     Person(s) Educated  Patient    Methods  Explanation;Demonstration;Tactile cues;Verbal cues    Comprehension  Verbalized understanding;Returned demonstration;Verbal cues required;Tactile cues required;Need further instruction       PT Short Term Goals - 03/07/18 1657      PT SHORT TERM GOAL #1   Title  Patient will increase passive knee flexion by 15 degrees    Time  8    Period  Weeks    Status  New    Target Date  04/04/18      PT SHORT TERM GOAL #2   Title  Patient will increase right knee flexion and extension strength to 4+/5     Time  8    Period  Weeks    Status  New    Target Date  05/02/18      PT SHORT TERM  GOAL #3   Title  Patient will ambaulte 400' with good posutre and improved single leg stance time without AD    Time  4    Period  Weeks    Status  New    Target Date  04/04/18        PT Long Term Goals - 03/07/18 1659      PT LONG TERM GOAL #1   Title  Patient will go up/down 8 steps with reciprocal gait pattern safely     Time  8    Period  Weeks    Status  New    Target Date  05/02/18       PT LONG TERM GOAL #2   Title  Patient will return to the gym with aprogram to promote strength and endurance     Time  8    Period  Weeks    Status  New    Target Date  05/02/18      PT LONG TERM GOAL #3   Title  Patient will ambualte 3000' without AD in order to perfrom IADL's     Time  8    Period  Weeks    Status  New    Target Date  05/02/18             Plan - 03/07/18 1520    Clinical Impression Statement  Patient is a 73 year old male S/P right qudruceps tendon repair. His repair was done on 12/0/2020. He presents with expected limitations in range, strength, and function. He is having no pain and his edema is well controlled. He would beneift from skilled therapy to improve functional ability, strength , and range.     History and Personal Factors relevant to plan of care:  gout     Clinical Presentation  Stable    Clinical Presentation due to:  No pain but tightness     Clinical Decision Making  Low    Rehab Potential  Good    PT Frequency  2x / week    PT Duration  8 weeks    PT Treatment/Interventions  ADLs/Self Care Home Management;Cryotherapy;Electrical Stimulation;Iontophoresis 4mg /ml Dexamethasone;Ultrasound;Gait training;Functional mobility training;Therapeutic activities;Therapeutic exercise;Cognitive remediation;Neuromuscular re-education;Patient/family education;Manual techniques;Passive range of motion;Dry needling;Energy conservation;Taping;Balance training    PT Next Visit Plan  continue with light range of motion. Dont push end range. SAQ, SLR; Clam shell; standing march; balance exercises; ambualtion with a cane.     PT Home Exercise Plan  advised to continue with HEP; AAROM heel slide with strap; standing march     Consulted and Agree with Plan of Care  Patient       Patient will benefit from skilled therapeutic intervention in order to improve the following deficits and impairments:  Abnormal gait, Pain, Decreased activity tolerance, Decreased  endurance, Decreased strength, Decreased range of motion, Difficulty walking, Increased edema, Impaired flexibility, Decreased mobility  Visit Diagnosis: Stiffness of right knee, not elsewhere classified - Plan: PT plan of care cert/re-cert  Chronic pain of right knee - Plan: PT plan of care cert/re-cert  Other abnormalities of gait and mobility - Plan: PT plan of care cert/re-cert  Localized edema - Plan: PT plan of care cert/re-cert     Problem List Patient Active Problem List   Diagnosis Date Noted  . Rupture of right quadriceps tendon 01/14/2018  . 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 HTN (hypertension) 01/02/2018  . Acute monoarthritis 01/01/2018  . Carotid  occlusion, left 04/23/2017    Carney Living PT DPT  03/07/2018, 5:01 PM  [facility] [address] [place], [place], [address] Phone: [phone]   Fax:  [phone]  Name: Adam Gonzales MRN: [id number] Date of Birth: January 16, 1946

## 2018-03-12 ENCOUNTER — Ambulatory Visit: Payer: Medicare Other | Attending: Orthopaedic Surgery | Admitting: Physical Therapy

## 2018-03-12 ENCOUNTER — Encounter: Payer: Medicare Other | Admitting: Physical Therapy

## 2018-03-12 ENCOUNTER — Encounter: Payer: Self-pay | Admitting: Physical Therapy

## 2018-03-12 DIAGNOSIS — M25661 Stiffness of right knee, not elsewhere classified: Secondary | ICD-10-CM

## 2018-03-12 DIAGNOSIS — R600 Localized edema: Secondary | ICD-10-CM | POA: Diagnosis not present

## 2018-03-12 DIAGNOSIS — M25561 Pain in right knee: Secondary | ICD-10-CM | POA: Diagnosis not present

## 2018-03-12 DIAGNOSIS — R2689 Other abnormalities of gait and mobility: Secondary | ICD-10-CM | POA: Insufficient documentation

## 2018-03-12 DIAGNOSIS — G8929 Other chronic pain: Secondary | ICD-10-CM | POA: Insufficient documentation

## 2018-03-13 ENCOUNTER — Encounter: Payer: Self-pay | Admitting: Physical Therapy

## 2018-03-13 NOTE — Therapy (Signed)
[place], [place], [address] Phone: [phone]   Fax:  [phone]  Physical Therapy Treatment  Patient Details  Name: Adam Gonzales MRN: [id number] Date of Birth: May 17, 1945 Referring Provider (PT): Dr Frankey Shown    Encounter Date: 03/12/2018  PT End of Session - 03/12/18 1505    Visit Number  2    Number of Visits  16    Date for PT Re-Evaluation  05/02/18    Authorization Type  BCBS Medicare     PT Start Time  1500    PT Stop Time  1544    PT Time Calculation (min)  44 min    Activity Tolerance  Patient tolerated treatment well    Behavior During Therapy  [facility] for tasks assessed/performed       Past Medical History:  Diagnosis Date  . 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 Gout   . Hypertension   . Quadriceps tendon rupture, right, initial encounter     Past Surgical History:  Procedure Laterality Date  . CYST EXCISION    . HYDROCELE EXCISION Right   . QUADRICEPS TENDON REPAIR Right 01/14/2018   Procedure: REPAIR RIGHT QUADRICEPS TENDON;  Surgeon: Leandrew Koyanagi, MD;  Location: [place];  Service: Orthopedics;  Laterality: Right;    There were no vitals filed for this visit.  Subjective Assessment - 03/12/18 1504    Subjective  Patient is not having any pain today. He has been using his walker in front of him for support but not holding on a lot. No issues ver the weekend.     Pertinent History  Gout     Limitations  Sitting    How long can you stand comfortably?  limited compared to baseline     How long can you walk comfortably?  limited household distance     Diagnostic tests  nothing post op    Patient Stated Goals  to get back to the gym and work     Currently in Pain?  No/denies                       [facility] Adult PT Treatment/Exercise - 03/13/18 0001      Knee/Hip Exercises: Aerobic   Nustep  5 min       Knee/Hip Exercises: Standing   Heel  Raises Limitations  x20     Hip Flexion Limitations  standing march 2x10     Forward Step Up  10 reps;Hand Hold: 2;Step Height: 2"    Forward Step Up Limitations  reported some fatigue with 2 inch     Other Standing Knee Exercises  standing march x10 with cuing for UE assist       Knee/Hip Exercises: Supine   Quad Sets Limitations  2x10 5 sec hold     Short Arc Quad Sets Limitations  3x10     Bridges Limitations  2x10    Straight Leg Raises Limitations  x20     Other Supine Knee/Hip Exercises  AAROM heel slide with strapcuing not to over stretch at end range     Other Supine Knee/Hip Exercises  supine clamshell 2x10 green       Manual Therapy   Manual Therapy  Passive ROM;Soft tissue mobilization    Passive ROM  IT band release with gentle passive knee flexion              PT Education -  03/12/18 1505    Education Details  reviewed HEP, symptom management     Person(s) Educated  Patient    Methods  Explanation;Demonstration;Tactile cues;Verbal cues    Comprehension  Verbalized understanding;Returned demonstration;Verbal cues required;Tactile cues required       PT Short Term Goals - 03/07/18 1657      PT SHORT TERM GOAL #1   Title  Patient will increase passive knee flexion by 15 degrees    Time  8    Period  Weeks    Status  New    Target Date  04/04/18      PT SHORT TERM GOAL #2   Title  Patient will increase right knee flexion and extension strength to 4+/5     Time  8    Period  Weeks    Status  New    Target Date  05/02/18      PT SHORT TERM GOAL #3   Title  Patient will ambaulte 400' with good posutre and improved single leg stance time without AD    Time  4    Period  Weeks    Status  New    Target Date  04/04/18        PT Long Term Goals - 03/07/18 1659      PT LONG TERM GOAL #1   Title  Patient will go up/down 8 steps with reciprocal gait pattern safely     Time  8    Period  Weeks    Status  New    Target Date  05/02/18      PT LONG TERM  GOAL #2   Title  Patient will return to the gym with aprogram to promote strength and endurance     Time  8    Period  Weeks    Status  New    Target Date  05/02/18      PT LONG TERM GOAL #3   Title  Patient will ambualte 3000' without AD in order to perfrom IADL's     Time  8    Period  Weeks    Status  New    Target Date  05/02/18            Plan - 03/13/18 0910    Clinical Impression Statement  Patients range proegressed very well. He reports hehas not been being overly agressive with it. Therapy focused on light ROM this visit as well as cane training He was not comfrtable with the cane at first but felt more comofrtable with paracitce. He was encouraged to continue with active and light active assitive ROM at home. The way he is preogressing with range thereei sno need to be aggresive. Therapy also added in the nu-step. He had no increased pain with new activity but found some of it challenging    Clinical Presentation  Stable    Clinical Decision Making  Low    Rehab Potential  Good    PT Frequency  2x / week    PT Duration  8 weeks    PT Treatment/Interventions  ADLs/Self Care Home Management;Cryotherapy;Electrical Stimulation;Iontophoresis 4mg /ml Dexamethasone;Ultrasound;Gait training;Functional mobility training;Therapeutic activities;Therapeutic exercise;Cognitive remediation;Neuromuscular re-education;Patient/family education;Manual techniques;Passive range of motion;Dry needling;Energy conservation;Taping;Balance training    PT Next Visit Plan  continue with light range of motion. Dont push end range. SAQ, SLR; Clam shell; standing march; balance exercises; ambualtion with a cane.     PT Home Exercise Plan  advised to continue with HEP; AAROM heel  slide with strap; standing march     Consulted and Agree with Plan of Care  Patient       Patient will benefit from skilled therapeutic intervention in order to improve the following deficits and impairments:  Abnormal gait,  Pain, Decreased activity tolerance, Decreased endurance, Decreased strength, Decreased range of motion, Difficulty walking, Increased edema, Impaired flexibility, Decreased mobility  Visit Diagnosis: Stiffness of right knee, not elsewhere classified  Chronic pain of right knee  Other abnormalities of gait and mobility  Localized edema     Problem List Patient Active Problem List   Diagnosis Date Noted  . Rupture of right quadriceps tendon 01/14/2018  . 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 HTN (hypertension) 01/02/2018  . Acute monoarthritis 01/01/2018  . 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

## 2018-03-15 ENCOUNTER — Encounter: Payer: Medicare Other | Admitting: Physical Therapy

## 2018-03-19 ENCOUNTER — Encounter: Payer: Self-pay | Admitting: Physical Therapy

## 2018-03-19 ENCOUNTER — Ambulatory Visit: Payer: Medicare Other | Admitting: Physical Therapy

## 2018-03-19 DIAGNOSIS — R2689 Other abnormalities of gait and mobility: Secondary | ICD-10-CM

## 2018-03-19 DIAGNOSIS — G8929 Other chronic pain: Secondary | ICD-10-CM | POA: Diagnosis not present

## 2018-03-19 DIAGNOSIS — M25661 Stiffness of right knee, not elsewhere classified: Secondary | ICD-10-CM

## 2018-03-19 DIAGNOSIS — M25561 Pain in right knee: Secondary | ICD-10-CM | POA: Diagnosis not present

## 2018-03-19 DIAGNOSIS — R600 Localized edema: Secondary | ICD-10-CM | POA: Diagnosis not present

## 2018-03-20 NOTE — Therapy (Signed)
[place], [place], [address] Phone: [phone]   Fax:  [phone]  Physical Therapy Treatment  Patient Details  Name: DIANDRE MERICA MRN: [id number] Date of Birth: 03/11/1945 Referring Provider (PT): Dr Frankey Shown    Encounter Date: 03/19/2018  PT End of Session - 03/19/18 1418    Visit Number  3    Number of Visits  16    Date for PT Re-Evaluation  05/02/18    Authorization Type  [employer]BS Medicare     PT Start Time  1330    PT Stop Time  1412    PT Time Calculation (min)  42 min    Activity Tolerance  Patient tolerated treatment well    Behavior During Therapy  [facility] for tasks assessed/performed       Past Medical History:  Diagnosis Date  . 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 Gout   . Hypertension   . Quadriceps tendon rupture, right, initial encounter     Past Surgical History:  Procedure Laterality Date  . CYST EXCISION    . HYDROCELE EXCISION Right   . QUADRICEPS TENDON REPAIR Right 01/14/2018   Procedure: REPAIR RIGHT QUADRICEPS TENDON;  Surgeon: Leandrew Koyanagi, MD;  Location: [place];  Service: Orthopedics;  Laterality: Right;    There were no vitals filed for this visit.  Subjective Assessment - 03/19/18 1353    Subjective  Patient has no pain at this time. He has been doing his exercises 3x a day. He has been working with his cane at home.     How long can you stand comfortably?  limited compared to baseline     How long can you walk comfortably?  limited household distance     Diagnostic tests  nothing post op    Patient Stated Goals  to get back to the gym and work     Currently in Pain?  No/denies                       [facility] Adult PT Treatment/Exercise - 03/20/18 0001      Ambulation/Gait   Gait Comments  fit patients cane       Knee/Hip Exercises: Aerobic   Nustep  5 min       Knee/Hip Exercises: Machines for Strengthening   Cybex Leg  Press  20 lb 3x15       Knee/Hip Exercises: Standing   Heel Raises Limitations  2x20    Hip Flexion Limitations  standing march 2x10     Forward Step Up  10 reps;Hand Hold: 2;Step Height: 4"    SLS  3x15 sec hold with 1 hand hold left hand       Knee/Hip Exercises: Seated   Long Arc Quad Limitations  3x10     Heel Slides Limitations  red band 3x10       Knee/Hip Exercises: Supine   Short Arc Quad Sets Limitations  3x10 1 lb     Bridges Limitations  2x10    Straight Leg Raises Limitations  x20 2lb       Manual Therapy   Manual Therapy  Passive ROM;Soft tissue mobilization    Passive ROM  IT band release with gentle passive knee flexion              PT Education - 03/19/18 1401    Education Details  reviewed HEp, symtom management, pace with exercises.  Person(s) Educated  Patient    Methods  Explanation;Demonstration;Tactile cues;Verbal cues    Comprehension  Verbalized understanding;Returned demonstration;Verbal cues required;Tactile cues required;Need further instruction       PT Short Term Goals - 03/20/18 1214      PT SHORT TERM GOAL #1   Title  Patient will increase passive knee flexion by 15 degrees    Time  8    Period  Weeks    Status  On-going      PT SHORT TERM GOAL #2   Title  Patient will increase right knee flexion and extension strength to 4+/5     Time  8    Period  Weeks    Status  On-going      PT SHORT TERM GOAL #3   Title  Patient will ambaulte 400' with good posutre and improved single leg stance time without AD    Time  4    Period  Weeks    Status  On-going        PT Long Term Goals - 03/07/18 1659      PT LONG TERM GOAL #1   Title  Patient will go up/down 8 steps with reciprocal gait pattern safely     Time  8    Period  Weeks    Status  New    Target Date  05/02/18      PT LONG TERM GOAL #2   Title  Patient will return to the gym with aprogram to promote strength and endurance     Time  8    Period  Weeks    Status   New    Target Date  05/02/18      PT LONG TERM GOAL #3   Title  Patient will ambualte 3000' without AD in order to perfrom IADL's     Time  8    Period  Weeks    Status  New    Target Date  05/02/18            Plan - 03/20/18 1212    Clinical Impression Statement  Therapy added leg press and single leg stance. He had no increase in pain. Knee flexion measured at 108. He was also given light resitance with LAQ. He is making good progress. Therapy set his cane at a level that is good for him.     Clinical Presentation  Stable    Clinical Decision Making  Low    Rehab Potential  Good    PT Frequency  2x / week    PT Duration  8 weeks    PT Treatment/Interventions  ADLs/Self Care Home Management;Cryotherapy;Electrical Stimulation;Iontophoresis 4mg /ml Dexamethasone;Ultrasound;Gait training;Functional mobility training;Therapeutic activities;Therapeutic exercise;Cognitive remediation;Neuromuscular re-education;Patient/family education;Manual techniques;Passive range of motion;Dry needling;Energy conservation;Taping;Balance training    PT Next Visit Plan  continue with light range of motion. Dont push end range. SAQ, SLR; Clam shell; standing march; balance exercises; ambualtion with a cane.     PT Home Exercise Plan  advised to continue with HEP; AAROM heel slide with strap; standing march     Consulted and Agree with Plan of Care  Patient       Patient will benefit from skilled therapeutic intervention in order to improve the following deficits and impairments:  Abnormal gait, Pain, Decreased activity tolerance, Decreased endurance, Decreased strength, Decreased range of motion, Difficulty walking, Increased edema, Impaired flexibility, Decreased mobility  Visit Diagnosis: Stiffness of right knee, not elsewhere classified  Chronic pain of right knee  Other abnormalities of gait and mobility  Localized edema     Problem List Patient Active Problem List   Diagnosis Date Noted   . Rupture of right quadriceps tendon 01/14/2018  . 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 HTN (hypertension) 01/02/2018  . Acute monoarthritis 01/01/2018  . 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 [place], [place], [address] Phone: [phone]   Fax:  [phone]  Name: DAGO JUNGWIRTH MRN: [id number] Date of Birth: 07-12-1945

## 2018-03-22 ENCOUNTER — Encounter: Payer: Self-pay | Admitting: Physical Therapy

## 2018-03-22 ENCOUNTER — Ambulatory Visit: Payer: Medicare Other | Admitting: Physical Therapy

## 2018-03-22 DIAGNOSIS — R2689 Other abnormalities of gait and mobility: Secondary | ICD-10-CM

## 2018-03-22 DIAGNOSIS — M25561 Pain in right knee: Secondary | ICD-10-CM

## 2018-03-22 DIAGNOSIS — R600 Localized edema: Secondary | ICD-10-CM

## 2018-03-22 DIAGNOSIS — M25661 Stiffness of right knee, not elsewhere classified: Secondary | ICD-10-CM

## 2018-03-22 DIAGNOSIS — G8929 Other chronic pain: Secondary | ICD-10-CM

## 2018-03-22 NOTE — Therapy (Signed)
[place], [place], [address] Phone: [phone]   Fax:  [phone]  Physical Therapy Treatment  Patient Details  Name: Adam Gonzales MRN: [id number] Date of Birth: 10/31/1945 Referring Provider (PT): Dr Frankey Shown    Encounter Date: 03/22/2018  PT End of Session - 03/22/18 1131    Visit Number  4    Number of Visits  16    Date for PT Re-Evaluation  05/02/18    Authorization Type  [employer] Medicare     PT Start Time  1100    PT Stop Time  1143    PT Time Calculation (min)  43 min    Activity Tolerance  Patient tolerated treatment well    Behavior During Therapy  [facility] for tasks assessed/performed       Past Medical History:  Diagnosis Date  . 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 Gout   . Hypertension   . Quadriceps tendon rupture, right, initial encounter     Past Surgical History:  Procedure Laterality Date  . CYST EXCISION    . HYDROCELE EXCISION Right   . QUADRICEPS TENDON REPAIR Right 01/14/2018   Procedure: REPAIR RIGHT QUADRICEPS TENDON;  Surgeon: Leandrew Koyanagi, MD;  Location: [place];  Service: Orthopedics;  Laterality: Right;    There were no vitals filed for this visit.  Subjective Assessment - 03/22/18 1114    Subjective  Patient worke up Tued[place] morning with stiffness and pain. Within 24 hours the pain had improved significantly. It is still stiff. He has been doing light exercises.     Pertinent History  Gout     Limitations  Sitting    How long can you stand comfortably?  limited compared to baseline     How long can you walk comfortably?  limited household distance     Diagnostic tests  nothing post op    Patient Stated Goals  to get back to the gym and work     Currently in Pain?  No/denies                       [facility] Adult PT Treatment/Exercise - 03/22/18 0001      Knee/Hip Exercises: Standing   Heel Raises Limitations  2x20    Hip  Flexion Limitations  standing march 2x10     Other Standing Knee Exercises  3 way hip x20 seach wqay      Knee/Hip Exercises: Supine   Quad Sets Limitations  3x10 5 sec holf       Manual Therapy   Manual therapy comments  gentle ROM avoiding end range; patellar mobilization     Passive ROM  IT band release with gentle passive knee flexion              PT Education - 03/22/18 1131    Education Details  scaled back HEP; talked about symptom managment     Person(s) Educated  Patient    Methods  Explanation;Demonstration;Tactile cues;Verbal cues    Comprehension  Verbalized understanding;Returned demonstration;Verbal cues required;Tactile cues required;Need further instruction       PT Short Term Goals - 03/20/18 1214      PT SHORT TERM GOAL #1   Title  Patient will increase passive knee flexion by 15 degrees    Time  8    Period  Weeks    Status  On-going      PT SHORT TERM  GOAL #2   Title  Patient will increase right knee flexion and extension strength to 4+/5     Time  8    Period  Weeks    Status  On-going      PT SHORT TERM GOAL #3   Title  Patient will ambaulte 400' with good posutre and improved single leg stance time without AD    Time  4    Period  Weeks    Status  On-going        PT Long Term Goals - 03/07/18 1659      PT LONG TERM GOAL #1   Title  Patient will go up/down 8 steps with reciprocal gait pattern safely     Time  8    Period  Weeks    Status  New    Target Date  05/02/18      PT LONG TERM GOAL #2   Title  Patient will return to the gym with aprogram to promote strength and endurance     Time  8    Period  Weeks    Status  New    Target Date  05/02/18      PT LONG TERM GOAL #3   Title  Patient will ambualte 3000' without AD in order to perfrom IADL's     Time  8    Period  Weeks    Status  New    Target Date  05/02/18            Plan - 03/22/18 1146    Clinical Impression Statement  Therapy scaled back exercises today.  He is a little more stiff today but not too bad. He is having no pain or visable swelling. He walked with the cane with no buckling in the clinic. He perfromed hip exercises today.Marland Kitchen His motion was measured at 100 degrees. It appears to be just DOMS or just some overworking of the muscle. He was encouraged to keep his knee moving but dont over stretch it. He was also enocuraged to continue quad setsa and 3 way hip exercises.     Clinical Presentation  Stable    Clinical Decision Making  Low    Rehab Potential  Good    PT Frequency  2x / week    PT Duration  8 weeks    PT Treatment/Interventions  ADLs/Self Care Home Management;Cryotherapy;Electrical Stimulation;Iontophoresis 4mg /ml Dexamethasone;Ultrasound;Gait training;Functional mobility training;Therapeutic activities;Therapeutic exercise;Cognitive remediation;Neuromuscular re-education;Patient/family education;Manual techniques;Passive range of motion;Dry needling;Energy conservation;Taping;Balance training    PT Next Visit Plan  continue with light range of motion. Dont push end range. SAQ, SLR; Clam shell; standing march; balance exercises; ambualtion with a cane.     PT Home Exercise Plan  advised to continue with HEP; AAROM heel slide with strap; standing march     Consulted and Agree with Plan of Care  Patient       Patient will benefit from skilled therapeutic intervention in order to improve the following deficits and impairments:  Abnormal gait, Pain, Decreased activity tolerance, Decreased endurance, Decreased strength, Decreased range of motion, Difficulty walking, Increased edema, Impaired flexibility, Decreased mobility  Visit Diagnosis: Stiffness of right knee, not elsewhere classified  Chronic pain of right knee  Other abnormalities of gait and mobility  Localized edema     Problem List Patient Active Problem List   Diagnosis Date Noted  . Rupture of right quadriceps tendon 01/14/2018  . CKD (chronic kidney disease)  stage 2, GFR 60-89 ml/min 01/02/2018  .  HTN (hypertension) 01/02/2018  . Acute monoarthritis 01/01/2018  . 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 [address] [place], [place], [address] Phone: [phone]   Fax:  [phone]  Name: Adam Gonzales MRN: [id number] Date of Birth: 1945-04-21

## 2018-03-27 ENCOUNTER — Ambulatory Visit: Payer: Medicare Other | Admitting: Physical Therapy

## 2018-03-27 ENCOUNTER — Encounter: Payer: Self-pay | Admitting: Physical Therapy

## 2018-03-27 DIAGNOSIS — M25661 Stiffness of right knee, not elsewhere classified: Secondary | ICD-10-CM

## 2018-03-27 DIAGNOSIS — R2689 Other abnormalities of gait and mobility: Secondary | ICD-10-CM

## 2018-03-27 DIAGNOSIS — M25561 Pain in right knee: Secondary | ICD-10-CM | POA: Diagnosis not present

## 2018-03-27 DIAGNOSIS — R600 Localized edema: Secondary | ICD-10-CM

## 2018-03-27 DIAGNOSIS — G8929 Other chronic pain: Secondary | ICD-10-CM | POA: Diagnosis not present

## 2018-03-28 NOTE — Therapy (Signed)
[place], [place], [address] Phone: [phone]   Fax:  [phone]  Physical Therapy Treatment  Patient Details  Name: Adam Gonzales MRN: [id number] Date of Birth: 12-Jan-1946 Referring Provider (PT): Dr Frankey Shown    Encounter Date: 03/27/2018  PT End of Session - 03/27/18 1441    Visit Number  5    Number of Visits  16    Date for PT Re-Evaluation  05/02/18    Authorization Type  [employer] Medicare     PT Start Time  1416    PT Stop Time  1457    PT Time Calculation (min)  41 min    Activity Tolerance  Patient tolerated treatment well    Behavior During Therapy  [facility] for tasks assessed/performed       Past Medical History:  Diagnosis Date  . 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 Gout   . Hypertension   . Quadriceps tendon rupture, right, initial encounter     Past Surgical History:  Procedure Laterality Date  . CYST EXCISION    . HYDROCELE EXCISION Right   . QUADRICEPS TENDON REPAIR Right 01/14/2018   Procedure: REPAIR RIGHT QUADRICEPS TENDON;  Surgeon: Leandrew Koyanagi, MD;  Location: [place];  Service: Orthopedics;  Laterality: Right;    There were no vitals filed for this visit.  Subjective Assessment - 03/27/18 1423    Subjective  No pain just tightness. He did light hip exercises over the weekend.     Pertinent History  Gout     Limitations  Sitting    How long can you stand comfortably?  limited compared to baseline     How long can you walk comfortably?  limited household distance     Diagnostic tests  nothing post op    Patient Stated Goals  to get back to the gym and work     Currently in Pain?  No/denies                       [facility] Adult PT Treatment/Exercise - 03/28/18 0001      Knee/Hip Exercises: Stretches   Active Hamstring Stretch Limitations  3x20 sec hold       Knee/Hip Exercises: Aerobic   Nustep  5 min to tolerance       Knee/Hip  Exercises: Standing   Heel Raises Limitations  2x20    Hip Flexion Limitations  standing march 2x10     Forward Step Up Limitations  2 inch 2x10     SLS  3x15 sec hold with 1 hand hold left hand     Other Standing Knee Exercises  3 way hip x20 seach wqay      Knee/Hip Exercises: Supine   Quad Sets Limitations  3x10 sec hold     Short Arc Quad Sets Limitations  3x10 1 lb     Straight Leg Raises Limitations  x20 2lb     Other Supine Knee/Hip Exercises  supine clam shell x30 green              PT Education - 03/27/18 1438    Education Details  reviewed HEP, symptom mangement     Person(s) Educated  Patient    Methods  Explanation    Comprehension  Returned demonstration;Tactile cues required;Verbal cues required;Verbalized understanding       PT Short Term Goals - 03/20/18 1214      PT SHORT  TERM GOAL #1   Title  Patient will increase passive knee flexion by 15 degrees    Time  8    Period  Weeks    Status  On-going      PT SHORT TERM GOAL #2   Title  Patient will increase right knee flexion and extension strength to 4+/5     Time  8    Period  Weeks    Status  On-going      PT SHORT TERM GOAL #3   Title  Patient will ambaulte 400' with good posutre and improved single leg stance time without AD    Time  4    Period  Weeks    Status  On-going        PT Long Term Goals - 03/07/18 1659      PT LONG TERM GOAL #1   Title  Patient will go up/down 8 steps with reciprocal gait pattern safely     Time  8    Period  Weeks    Status  New    Target Date  05/02/18      PT LONG TERM GOAL #2   Title  Patient will return to the gym with aprogram to promote strength and endurance     Time  8    Period  Weeks    Status  New    Target Date  05/02/18      PT LONG TERM GOAL #3   Title  Patient will ambualte 3000' without AD in order to perfrom IADL's     Time  8    Period  Weeks    Status  New    Target Date  05/02/18            Plan - 03/27/18 1443     Clinical Impression Statement  Patient continues to have increased tightness in his knee but he tolerated exercises really well. Therapy added back in lgiht quad exercises without difficulty. He had no increased pain with treatment. Therapy added a low step back in as well.     Clinical Presentation  Stable    Clinical Decision Making  Low    Rehab Potential  Good    PT Frequency  2x / week    PT Duration  8 weeks    PT Treatment/Interventions  ADLs/Self Care Home Management;Cryotherapy;Electrical Stimulation;Iontophoresis 4mg /ml Dexamethasone;Ultrasound;Gait training;Functional mobility training;Therapeutic activities;Therapeutic exercise;Cognitive remediation;Neuromuscular re-education;Patient/family education;Manual techniques;Passive range of motion;Dry needling;Energy conservation;Taping;Balance training    PT Next Visit Plan  continue with light range of motion. Dont push end range. SAQ, SLR; Clam shell; standing march; balance exercises; ambualtion with a cane. Continue to not strees anything past 40 degrees of flexion.     PT Home Exercise Plan  advised to continue with HEP; AAROM heel slide with strap; standing march     Consulted and Agree with Plan of Care  Patient       Patient will benefit from skilled therapeutic intervention in order to improve the following deficits and impairments:  Abnormal gait, Pain, Decreased activity tolerance, Decreased endurance, Decreased strength, Decreased range of motion, Difficulty walking, Increased edema, Impaired flexibility, Decreased mobility  Visit Diagnosis: Stiffness of right knee, not elsewhere classified  Chronic pain of right knee  Other abnormalities of gait and mobility  Localized edema     Problem List Patient Active Problem List   Diagnosis Date Noted  . Rupture of right quadriceps tendon 01/14/2018  . CKD (chronic kidney disease) stage  2, GFR 60-89 ml/min 01/02/2018  . HTN (hypertension) 01/02/2018  . Acute monoarthritis  01/01/2018  . 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 [place], [place], [address] Phone: [phone]   Fax:  [phone]  Name: Adam Gonzales MRN: [id number] Date of Birth: 1945-03-12

## 2018-03-29 ENCOUNTER — Ambulatory Visit: Payer: Medicare Other | Admitting: Physical Therapy

## 2018-03-29 ENCOUNTER — Encounter: Payer: Self-pay | Admitting: Physical Therapy

## 2018-03-29 DIAGNOSIS — G8929 Other chronic pain: Secondary | ICD-10-CM | POA: Diagnosis not present

## 2018-03-29 DIAGNOSIS — M25561 Pain in right knee: Secondary | ICD-10-CM

## 2018-03-29 DIAGNOSIS — M25661 Stiffness of right knee, not elsewhere classified: Secondary | ICD-10-CM | POA: Diagnosis not present

## 2018-03-29 DIAGNOSIS — R2689 Other abnormalities of gait and mobility: Secondary | ICD-10-CM | POA: Diagnosis not present

## 2018-03-29 DIAGNOSIS — R600 Localized edema: Secondary | ICD-10-CM

## 2018-03-29 NOTE — Therapy (Signed)
[place], [place], [address] Phone: [phone]   Fax:  [phone]  Physical Therapy Treatment  Patient Details  Name: Adam Gonzales MRN: [id number] Date of Birth: 11-08-1945 Referring Provider (PT): Dr Frankey Shown    Encounter Date: 03/29/2018  PT End of Session - 03/29/18 1118    Visit Number  6    Number of Visits  16    Date for PT Re-Evaluation  05/02/18    Authorization Type  [employer] Medicare     PT Start Time  1102    PT Stop Time  1142    PT Time Calculation (min)  40 min    Activity Tolerance  Patient tolerated treatment well    Behavior During Therapy  [facility] for tasks assessed/performed       Past Medical History:  Diagnosis Date  . 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 Gout   . Hypertension   . Quadriceps tendon rupture, right, initial encounter     Past Surgical History:  Procedure Laterality Date  . CYST EXCISION    . HYDROCELE EXCISION Right   . QUADRICEPS TENDON REPAIR Right 01/14/2018   Procedure: REPAIR RIGHT QUADRICEPS TENDON;  Surgeon: Leandrew Koyanagi, MD;  Location: [place];  Service: Orthopedics;  Laterality: Right;    There were no vitals filed for this visit.  Subjective Assessment - 03/29/18 1104    Subjective  Patient had no issuse after the last visit. He is using the walker today because of the snow. He is still feeling stiff but feels like it is loosening     Patient is accompained by:  Family member    Pertinent History  Gout     Limitations  Sitting    How long can you stand comfortably?  limited compared to baseline     How long can you walk comfortably?  limited household distance     Diagnostic tests  nothing post op    Patient Stated Goals  to get back to the gym and work     Currently in Pain?  No/denies                       [facility] Adult PT Treatment/Exercise - 03/29/18 0001      Knee/Hip Exercises: Stretches   Active  Hamstring Stretch Limitations  3x20 sec hold       Knee/Hip Exercises: Aerobic   Nustep  5 min to tolerance       Knee/Hip Exercises: Standing   Heel Raises Limitations  2x20    Hip Flexion Limitations  standing march 2x10     Forward Step Up Limitations  2 inch 2x10     SLS  3x15 sec hold with 1 hand hold left hand     Other Standing Knee Exercises  3 way hip x20 seach wqay    Other Standing Knee Exercises  step onto air-ex 2x10       Knee/Hip Exercises: Supine   Quad Sets Limitations  3x15     Short Arc Quad Sets Limitations  2x15 1 lb     Straight Leg Raises Limitations  x20 1lb     Other Supine Knee/Hip Exercises  supine clam shell x30 green       Manual Therapy   Manual therapy comments  gentle ROM avoiding end range; patellar mobilization     Passive ROM  IT band release with gentle passive knee flexion  PT Education - 03/29/18 1118    Education Details  reviewed HEP and symptom managment     Person(s) Educated  Patient    Methods  Explanation;Demonstration;Tactile cues;Verbal cues    Comprehension  Verbalized understanding;Returned demonstration;Tactile cues required;Verbal cues required       PT Short Term Goals - 03/20/18 1214      PT SHORT TERM GOAL #1   Title  Patient will increase passive knee flexion by 15 degrees    Time  8    Period  Weeks    Status  On-going      PT SHORT TERM GOAL #2   Title  Patient will increase right knee flexion and extension strength to 4+/5     Time  8    Period  Weeks    Status  On-going      PT SHORT TERM GOAL #3   Title  Patient will ambaulte 400' with good posutre and improved single leg stance time without AD    Time  4    Period  Weeks    Status  On-going        PT Long Term Goals - 03/07/18 1659      PT LONG TERM GOAL #1   Title  Patient will go up/down 8 steps with reciprocal gait pattern safely     Time  8    Period  Weeks    Status  New    Target Date  05/02/18      PT LONG TERM GOAL  #2   Title  Patient will return to the gym with aprogram to promote strength and endurance     Time  8    Period  Weeks    Status  New    Target Date  05/02/18      PT LONG TERM GOAL #3   Title  Patient will ambualte 3000' without AD in order to perfrom IADL's     Time  8    Period  Weeks    Status  New    Target Date  05/02/18            Plan - 03/29/18 1515    Clinical Impression Statement  Patient is making steady progress. His knee flexion was measured at 99 degrees today. Therapy began oworking on some instability training. He toelrated well and reported no increase in pain.     Clinical Presentation  Stable    Clinical Decision Making  Low    Rehab Potential  Good    PT Frequency  2x / week    PT Duration  8 weeks    PT Treatment/Interventions  ADLs/Self Care Home Management;Cryotherapy;Electrical Stimulation;Iontophoresis 4mg /ml Dexamethasone;Ultrasound;Gait training;Functional mobility training;Therapeutic activities;Therapeutic exercise;Cognitive remediation;Neuromuscular re-education;Patient/family education;Manual techniques;Passive range of motion;Dry needling;Energy conservation;Taping;Balance training    PT Next Visit Plan  continue with light range of motion. Dont push end range. SAQ, SLR; Clam shell; standing march; balance exercises; ambualtion with a cane. Continue to not strees anything past 40 degrees of flexion.     PT Home Exercise Plan  advised to continue with HEP; AAROM heel slide with strap; standing march     Consulted and Agree with Plan of Care  Patient       Patient will benefit from skilled therapeutic intervention in order to improve the following deficits and impairments:  Abnormal gait, Pain, Decreased activity tolerance, Decreased endurance, Decreased strength, Decreased range of motion, Difficulty walking, Increased edema, Impaired flexibility, Decreased mobility  Visit  Diagnosis: Stiffness of right knee, not elsewhere classified  Chronic  pain of right knee  Other abnormalities of gait and mobility  Localized edema     Problem List Patient Active Problem List   Diagnosis Date Noted  . Rupture of right quadriceps tendon 01/14/2018  . 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 HTN (hypertension) 01/02/2018  . Acute monoarthritis 01/01/2018  . 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 [place], [place], [address] Phone: [phone]   Fax:  [phone]  Name: Adam Gonzales MRN: [id number] Date of Birth: 1945-10-03

## 2018-04-02 ENCOUNTER — Encounter: Payer: Self-pay | Admitting: Physical Therapy

## 2018-04-02 ENCOUNTER — Ambulatory Visit: Payer: Medicare Other | Admitting: Physical Therapy

## 2018-04-02 DIAGNOSIS — G8929 Other chronic pain: Secondary | ICD-10-CM

## 2018-04-02 DIAGNOSIS — M25561 Pain in right knee: Secondary | ICD-10-CM | POA: Diagnosis not present

## 2018-04-02 DIAGNOSIS — M25661 Stiffness of right knee, not elsewhere classified: Secondary | ICD-10-CM | POA: Diagnosis not present

## 2018-04-02 DIAGNOSIS — R600 Localized edema: Secondary | ICD-10-CM | POA: Diagnosis not present

## 2018-04-02 DIAGNOSIS — R2689 Other abnormalities of gait and mobility: Secondary | ICD-10-CM

## 2018-04-03 ENCOUNTER — Encounter: Payer: Self-pay | Admitting: Physical Therapy

## 2018-04-03 NOTE — Therapy (Signed)
[place], [place], [address] Phone: [phone]   Fax:  [phone]  Physical Therapy Treatment  Patient Details  Name: Adam Gonzales MRN: [id number] Date of Birth: April 24, 1945 Referring Provider (PT): Dr Frankey Shown    Encounter Date: 04/02/2018  PT End of Session - 04/03/18 1300    Visit Number  7    Number of Visits  16    Date for PT Re-Evaluation  05/02/18    Authorization Type  [employer] Medicare     PT Start Time  1330    PT Stop Time  1412    PT Time Calculation (min)  42 min    Activity Tolerance  Patient tolerated treatment well    Behavior During Therapy  [facility] for tasks assessed/performed       Past Medical History:  Diagnosis Date  . 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 Gout   . Hypertension   . Quadriceps tendon rupture, right, initial encounter     Past Surgical History:  Procedure Laterality Date  . CYST EXCISION    . HYDROCELE EXCISION Right   . QUADRICEPS TENDON REPAIR Right 01/14/2018   Procedure: REPAIR RIGHT QUADRICEPS TENDON;  Surgeon: Leandrew Koyanagi, MD;  Location: [place];  Service: Orthopedics;  Laterality: Right;    There were no vitals filed for this visit.  Subjective Assessment - 04/02/18 1346    Subjective  Patient has no complaints at this time. He has been walking in the house without a cane.     Patient is accompained by:  Family member    Pertinent History  Gout     Limitations  Sitting    How long can you stand comfortably?  limited compared to baseline     Currently in Pain?  No/denies                       [facility] Adult PT Treatment/Exercise - 04/03/18 0001      Knee/Hip Exercises: Stretches   Active Hamstring Stretch Limitations  3x20 sec hold       Knee/Hip Exercises: Aerobic   Nustep  5 min to tolerance       Knee/Hip Exercises: Standing   Heel Raises Limitations  2x20    Hip Flexion Limitations  standing march 2x10      Forward Step Up Limitations  2 inch 2x10     SLS  3x15 sec hold with 1 hand hold left hand     Other Standing Knee Exercises  3 way hip x20 seach wqay    Other Standing Knee Exercises  step onto air-ex 2x10       Knee/Hip Exercises: Seated   Long Arc Quad Limitations  3x10       Knee/Hip Exercises: Supine   Short Arc Quad Sets Limitations  2x15 2lb     Straight Leg Raises Limitations  x20 1lb     Other Supine Knee/Hip Exercises  supine clam shell x30 green       Manual Therapy   Manual therapy comments  gentle ROM avoiding end range; patellar mobilization     Passive ROM  IT band release with gentle passive knee flexion              PT Education - 04/03/18 1300    Education Details  HEP, symptom management; gait training without device    Person(s) Educated  Patient    Methods  Explanation;Demonstration;Verbal  cues    Comprehension  Verbalized understanding;Returned demonstration       PT Short Term Goals - 04/03/18 1303      PT SHORT TERM GOAL #1   Title  Patient will increase passive knee flexion by 15 degrees    Time  8    Period  Weeks    Status  On-going      PT SHORT TERM GOAL #2   Title  Patient will increase right knee flexion and extension strength to 4+/5     Time  8    Period  Weeks    Status  On-going      PT SHORT TERM GOAL #3   Title  Patient will ambaulte 400' with good posutre and improved single leg stance time without AD    Time  4    Period  Weeks    Status  On-going        PT Long Term Goals - 03/07/18 1659      PT LONG TERM GOAL #1   Title  Patient will go up/down 8 steps with reciprocal gait pattern safely     Time  8    Period  Weeks    Status  New    Target Date  05/02/18      PT LONG TERM GOAL #2   Title  Patient will return to the gym with aprogram to promote strength and endurance     Time  8    Period  Weeks    Status  New    Target Date  05/02/18      PT LONG TERM GOAL #3   Title  Patient will ambualte 3000'  without AD in order to perfrom IADL's     Time  8    Period  Weeks    Status  New    Target Date  05/02/18            Plan - 04/03/18 1301    Clinical Impression Statement  Patient's knee flexion made a good jump today. He had 106 degrees without pushing knee into an agressive end range. He had no increase in pain. Patient continues to work on standing exercises. Therapy will progress as tolerated. The patient walked without a dvice with only a minor antalgic gait.     Clinical Presentation  Stable    Clinical Decision Making  Low    Rehab Potential  Good    PT Frequency  2x / week    PT Duration  8 weeks    PT Treatment/Interventions  ADLs/Self Care Home Management;Cryotherapy;Electrical Stimulation;Iontophoresis 4mg /ml Dexamethasone;Ultrasound;Gait training;Functional mobility training;Therapeutic activities;Therapeutic exercise;Cognitive remediation;Neuromuscular re-education;Patient/family education;Manual techniques;Passive range of motion;Dry needling;Energy conservation;Taping;Balance training    PT Next Visit Plan  continue with light range of motion. Dont push end range. SAQ, SLR; Clam shell; standing march; balance exercises; ambualtion with a cane. Continue to not strees anything past 40 degrees of flexion.     PT Home Exercise Plan  advised to continue with HEP; AAROM heel slide with strap; standing march     Consulted and Agree with Plan of Care  Patient       Patient will benefit from skilled therapeutic intervention in order to improve the following deficits and impairments:  Abnormal gait, Pain, Decreased activity tolerance, Decreased endurance, Decreased strength, Decreased range of motion, Difficulty walking, Increased edema, Impaired flexibility, Decreased mobility  Visit Diagnosis: Stiffness of right knee, not elsewhere classified  Chronic pain of right knee  Other abnormalities of gait and mobility  Localized edema     Problem List Patient Active Problem  List   Diagnosis Date Noted  . Rupture of right quadriceps tendon 01/14/2018  . 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 HTN (hypertension) 01/02/2018  . Acute monoarthritis 01/01/2018  . 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 [place], [place], [address] Phone: [phone]   Fax:  [phone]  Name: Adam Gonzales MRN: [id number] Date of Birth: 01/25/1946

## 2018-04-04 ENCOUNTER — Ambulatory Visit: Payer: Medicare Other | Admitting: Physical Therapy

## 2018-04-04 ENCOUNTER — Encounter: Payer: Self-pay | Admitting: Physical Therapy

## 2018-04-04 DIAGNOSIS — R600 Localized edema: Secondary | ICD-10-CM

## 2018-04-04 DIAGNOSIS — R2689 Other abnormalities of gait and mobility: Secondary | ICD-10-CM

## 2018-04-04 DIAGNOSIS — M25561 Pain in right knee: Secondary | ICD-10-CM

## 2018-04-04 DIAGNOSIS — M25661 Stiffness of right knee, not elsewhere classified: Secondary | ICD-10-CM | POA: Diagnosis not present

## 2018-04-04 DIAGNOSIS — G8929 Other chronic pain: Secondary | ICD-10-CM | POA: Diagnosis not present

## 2018-04-05 NOTE — Therapy (Signed)
[place], [place], [address] Phone: [phone]   Fax:  [phone]  Physical Therapy Treatment  Patient Details  Name: Adam Gonzales MRN: [id number] Date of Birth: 03-25-1945 Referring Provider (PT): Dr Frankey Shown    Encounter Date: 04/04/2018  PT End of Session - 04/04/18 1333    Visit Number  8    Number of Visits  16    Date for PT Re-Evaluation  05/02/18    Authorization Type  [employer] Medicare     PT Start Time  1325    PT Stop Time  1405    PT Time Calculation (min)  40 min    Activity Tolerance  Patient tolerated treatment well    Behavior During Therapy  [facility] for tasks assessed/performed       Past Medical History:  Diagnosis Date  . 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 Gout   . Hypertension   . Quadriceps tendon rupture, right, initial encounter     Past Surgical History:  Procedure Laterality Date  . CYST EXCISION    . HYDROCELE EXCISION Right   . QUADRICEPS TENDON REPAIR Right 01/14/2018   Procedure: REPAIR RIGHT QUADRICEPS TENDON;  Surgeon: Leandrew Koyanagi, MD;  Location: [place];  Service: Orthopedics;  Laterality: Right;    There were no vitals filed for this visit.  Subjective Assessment - 04/04/18 1330    Subjective  Patient has no comlaints. He reports just a little soreness after the last visit. He is using the cane less inside.     Patient is accompained by:  Family member    Pertinent History  Gout     Limitations  Sitting    How long can you stand comfortably?  limited compared to baseline     How long can you walk comfortably?  limited household distance     Diagnostic tests  nothing post op    Patient Stated Goals  to get back to the gym and work     Currently in Pain?  No/denies                       [facility] Adult PT Treatment/Exercise - 04/05/18 0001      Knee/Hip Exercises: Stretches   Active Hamstring Stretch Limitations  3x20  sec hold       Knee/Hip Exercises: Aerobic   Nustep  5 min to tolerance       Knee/Hip Exercises: Standing   Heel Raises Limitations  2x20    Hip Flexion Limitations  standing march 2x10     Lateral Step Up  20 reps;Right;Hand Hold: 1;Step Height: 4"    Forward Step Up Limitations  4 inch 2x10     Other Standing Knee Exercises  3 way hip x20 seach way 2lb weight     Other Standing Knee Exercises  step onto air-ex 2x10       Knee/Hip Exercises: Seated   Long Arc Quad Limitations  3x10       Knee/Hip Exercises: Supine   Short Arc Quad Sets Limitations  2x15 2lb     Straight Leg Raises Limitations  x20 2lb     Other Supine Knee/Hip Exercises  supine clam shell x30 green       Manual Therapy   Manual therapy comments  gentle ROM avoiding end range; patellar mobilization     Passive ROM  IT band release with gentle passive knee flexion  PT Education - 04/04/18 1332    Education Details  reviewed HEP, symptom management     Person(s) Educated  Patient    Methods  Explanation;Demonstration;Tactile cues;Verbal cues    Comprehension  Verbalized understanding;Returned demonstration;Verbal cues required;Tactile cues required       PT Short Term Goals - 04/03/18 1303      PT SHORT TERM GOAL #1   Title  Patient will increase passive knee flexion by 15 degrees    Time  8    Period  Weeks    Status  On-going      PT SHORT TERM GOAL #2   Title  Patient will increase right knee flexion and extension strength to 4+/5     Time  8    Period  Weeks    Status  On-going      PT SHORT TERM GOAL #3   Title  Patient will ambaulte 400' with good posutre and improved single leg stance time without AD    Time  4    Period  Weeks    Status  On-going        PT Long Term Goals - 03/07/18 1659      PT LONG TERM GOAL #1   Title  Patient will go up/down 8 steps with reciprocal gait pattern safely     Time  8    Period  Weeks    Status  New    Target Date  05/02/18       PT LONG TERM GOAL #2   Title  Patient will return to the gym with aprogram to promote strength and endurance     Time  8    Period  Weeks    Status  New    Target Date  05/02/18      PT LONG TERM GOAL #3   Title  Patient will ambualte 3000' without AD in order to perfrom IADL's     Time  8    Period  Weeks    Status  New    Target Date  05/02/18            Plan - 04/05/18 0804    Clinical Impression Statement  Patient continues to make great progress. Therapy has been slowly adavancing his weights without any increase in pain. Today he perfromed standing hip abduction with a 2lb weight.     Clinical Presentation  Stable    Clinical Decision Making  Low    Rehab Potential  Good    PT Frequency  2x / week    PT Duration  8 weeks    PT Treatment/Interventions  ADLs/Self Care Home Management;Cryotherapy;Electrical Stimulation;Iontophoresis 4mg /ml Dexamethasone;Ultrasound;Gait training;Functional mobility training;Therapeutic activities;Therapeutic exercise;Cognitive remediation;Neuromuscular re-education;Patient/family education;Manual techniques;Passive range of motion;Dry needling;Energy conservation;Taping;Balance training    PT Next Visit Plan  continue with light range of motion. Dont push end range. SAQ, SLR; Clam shell; standing march; balance exercises; ambualtion with a cane. Continue to not strees anything past 40 degrees of flexion.     PT Home Exercise Plan  advised to continue with HEP; AAROM heel slide with strap; standing march     Consulted and Agree with Plan of Care  Patient       Patient will benefit from skilled therapeutic intervention in order to improve the following deficits and impairments:  Abnormal gait, Pain, Decreased activity tolerance, Decreased endurance, Decreased strength, Decreased range of motion, Difficulty walking, Increased edema, Impaired flexibility, Decreased mobility  Visit Diagnosis: Stiffness of  right knee, not elsewhere  classified  Chronic pain of right knee  Other abnormalities of gait and mobility  Localized edema     Problem List Patient Active Problem List   Diagnosis Date Noted  . Rupture of right quadriceps tendon 01/14/2018  . 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 HTN (hypertension) 01/02/2018  . Acute monoarthritis 01/01/2018  . 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 [place], [place], [address] Phone: [phone]   Fax:  [phone]  Name: Adam Gonzales MRN: [id number] Date of Birth: 10-10-1945

## 2018-04-09 ENCOUNTER — Ambulatory Visit: Payer: Medicare Other | Attending: Orthopaedic Surgery | Admitting: Physical Therapy

## 2018-04-09 ENCOUNTER — Encounter: Payer: Self-pay | Admitting: Physical Therapy

## 2018-04-09 DIAGNOSIS — M25661 Stiffness of right knee, not elsewhere classified: Secondary | ICD-10-CM

## 2018-04-09 DIAGNOSIS — R2689 Other abnormalities of gait and mobility: Secondary | ICD-10-CM

## 2018-04-09 DIAGNOSIS — G8929 Other chronic pain: Secondary | ICD-10-CM | POA: Insufficient documentation

## 2018-04-09 DIAGNOSIS — R600 Localized edema: Secondary | ICD-10-CM | POA: Insufficient documentation

## 2018-04-09 DIAGNOSIS — M25561 Pain in right knee: Secondary | ICD-10-CM | POA: Insufficient documentation

## 2018-04-09 NOTE — Therapy (Signed)
[place], [place], [address] Phone: [phone]   Fax:  [phone]  Physical Therapy Treatment  Patient Details  Name: Adam Gonzales MRN: [id number] Date of Birth: 1945/09/08 Referring Provider (PT): Dr Frankey Shown    Encounter Date: 04/09/2018  PT End of Session - 04/09/18 1331    Visit Number  9    Number of Visits  16    Date for PT Re-Evaluation  05/02/18    Authorization Type  [employer] Medicare     PT Start Time  1332    PT Stop Time  1412    PT Time Calculation (min)  40 min    Activity Tolerance  Patient tolerated treatment well    Behavior During Therapy  [facility] for tasks assessed/performed       Past Medical History:  Diagnosis Date  . 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 Gout   . Hypertension   . Quadriceps tendon rupture, right, initial encounter     Past Surgical History:  Procedure Laterality Date  . CYST EXCISION    . HYDROCELE EXCISION Right   . QUADRICEPS TENDON REPAIR Right 01/14/2018   Procedure: REPAIR RIGHT QUADRICEPS TENDON;  Surgeon: Leandrew Koyanagi, MD;  Location: [place];  Service: Orthopedics;  Laterality: Right;    There were no vitals filed for this visit.  Subjective Assessment - 04/09/18 1330    Subjective  Patient reports it is not painful just tight but it is improving.     Patient is accompained by:  Family member    Pertinent History  Gout     Limitations  Sitting    How long can you stand comfortably?  limited compared to baseline     How long can you walk comfortably?  limited household distance     Diagnostic tests  nothing post op    Patient Stated Goals  to get back to the gym and work     Currently in Pain?  No/denies                       [facility] Adult PT Treatment/Exercise - 04/09/18 0001      Knee/Hip Exercises: Stretches   Active Hamstring Stretch Limitations  3x20 sec hold       Knee/Hip Exercises: Aerobic   Nustep  5 min to tolerance       Knee/Hip Exercises: Standing   Heel Raises Limitations  2x20    Hip Flexion Limitations  standing march 2x10     Lateral Step Up  20 reps;Right;Hand Hold: 1;Step Height: 4"    Forward Step Up Limitations  6 inch 2x10     Other Standing Knee Exercises  3 way hip x20 seach way 2lb weight     Other Standing Knee Exercises  step onto air-ex 2x10 foward and lateral' single leg stance with cone touch single hand UE support.       Knee/Hip Exercises: Seated   Long Arc Quad Limitations  3x10       Knee/Hip Exercises: Supine   Straight Leg Raises Limitations  x20 2lb     Other Supine Knee/Hip Exercises  supine clam shell x30 green       Manual Therapy   Manual therapy comments  gentle ROM avoiding end range; patellar mobilization     Passive ROM  IT band release with gentle passive knee flexion  PT Education - 04/09/18 1331    Education Details  HEP, symptom management     Person(s) Educated  Patient    Methods  Explanation;Demonstration;Tactile cues;Verbal cues    Comprehension  Verbalized understanding;Returned demonstration;Verbal cues required;Tactile cues required       PT Short Term Goals - 04/03/18 1303      PT SHORT TERM GOAL #1   Title  Patient will increase passive knee flexion by 15 degrees    Time  8    Period  Weeks    Status  On-going      PT SHORT TERM GOAL #2   Title  Patient will increase right knee flexion and extension strength to 4+/5     Time  8    Period  Weeks    Status  On-going      PT SHORT TERM GOAL #3   Title  Patient will ambaulte 400' with good posutre and improved single leg stance time without AD    Time  4    Period  Weeks    Status  On-going        PT Long Term Goals - 03/07/18 1659      PT LONG TERM GOAL #1   Title  Patient will go up/down 8 steps with reciprocal gait pattern safely     Time  8    Period  Weeks    Status  New    Target Date  05/02/18      PT LONG TERM GOAL #2    Title  Patient will return to the gym with aprogram to promote strength and endurance     Time  8    Period  Weeks    Status  New    Target Date  05/02/18      PT LONG TERM GOAL #3   Title  Patient will ambualte 3000' without AD in order to perfrom IADL's     Time  8    Period  Weeks    Status  New    Target Date  05/02/18            Plan - 04/09/18 1446    Clinical Impression Statement  Patient is making great progress. His single leg stability is improving. His range was measured at 0-113. He is not having any pain. Therapy ahas been advancing his strengthening. He is mostly crrying his cane. Therapy will continue to progress function as tolerated.     Rehab Potential  Good    PT Frequency  2x / week    PT Duration  8 weeks    PT Treatment/Interventions  ADLs/Self Care Home Management;Cryotherapy;Electrical Stimulation;Iontophoresis 4mg /ml Dexamethasone;Ultrasound;Gait training;Functional mobility training;Therapeutic activities;Therapeutic exercise;Cognitive remediation;Neuromuscular re-education;Patient/family education;Manual techniques;Passive range of motion;Dry needling;Energy conservation;Taping;Balance training    PT Next Visit Plan  continue with light range of motion. Dont push end range. SAQ, SLR; Clam shell; standing march; balance exercises; ambualtion with a cane. Continue to not strees anything past 40 degrees of flexion.     PT Home Exercise Plan  advised to continue with HEP; AAROM heel slide with strap; standing march     Consulted and Agree with Plan of Care  Patient       Patient will benefit from skilled therapeutic intervention in order to improve the following deficits and impairments:  Abnormal gait, Pain, Decreased activity tolerance, Decreased endurance, Decreased strength, Decreased range of motion, Difficulty walking, Increased edema, Impaired flexibility, Decreased mobility  Visit Diagnosis: Stiffness of right  knee, not elsewhere classified  Chronic  pain of right knee  Other abnormalities of gait and mobility  Localized edema     Problem List Patient Active Problem List   Diagnosis Date Noted  . Rupture of right quadriceps tendon 01/14/2018  . 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 HTN (hypertension) 01/02/2018  . Acute monoarthritis 01/01/2018  . 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 [place], [place], [address] Phone: [phone]   Fax:  [phone]  Name: OMARIE PARCELL MRN: [id number] Date of Birth: 09/11/1945

## 2018-04-10 ENCOUNTER — Encounter (INDEPENDENT_AMBULATORY_CARE_PROVIDER_SITE_OTHER): Payer: Self-pay | Admitting: Orthopaedic Surgery

## 2018-04-10 ENCOUNTER — Ambulatory Visit (INDEPENDENT_AMBULATORY_CARE_PROVIDER_SITE_OTHER): Payer: Medicare Other | Admitting: Orthopaedic Surgery

## 2018-04-10 DIAGNOSIS — S76111D Strain of right quadriceps muscle, fascia and tendon, subsequent encounter: Secondary | ICD-10-CM

## 2018-04-10 NOTE — Progress Notes (Signed)
   Post-Op Visit Note   Patient: Adam Gonzales           Date of Birth: 02/20/1945           MRN: [id number] Visit Date: 04/10/2018 PCP: Levin Erp, MD   Assessment & Plan:  Chief Complaint:  Chief Complaint  Patient presents with  . Right Knee - Pain, Routine Post Op, Follow-up   Visit Diagnoses:  1. Rupture of right quadriceps tendon, subsequent encounter     Plan: Mark is nearly 3 months status post right quadriceps repair.  He is doing well overall and reports no pain.  He is still attending physical therapy twice a week.  He has achieved 115 degrees of knee flexion.  He will often ambulate without crutches.  He is ready to go back to work.  His surgical scar is fully healed.  His range of motion is excellent.  He is got good strength in his extensor mechanism he does have atrophy of the quadriceps as we would expect.  Overall he looks really good to me.  At this point we will release him back to work.  I would like to recheck him in 3 months.  In the meantime he will continue to work with physical therapy and on strengthening.  Questions encouraged and answered.  Follow-Up Instructions: Return in about 3 months (around 07/11/2018).   Orders:  No orders of the defined types were placed in this encounter.  No orders of the defined types were placed in this encounter.   Imaging: No results found.  PMFS History: Patient Active Problem List   Diagnosis Date Noted  . Rupture of right quadriceps tendon 01/14/2018  . 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 HTN (hypertension) 01/02/2018  . Acute monoarthritis 01/01/2018  . 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 Gout   . Hypertension   . Quadriceps tendon rupture, right, initial encounter     Family History  Problem Relation Age of Onset  . Heart failure Mother   . Cancer - Colon Father   . Dementia Sister   .  Cancer Brother        throat    Past Surgical History:  Procedure Laterality Date  . CYST EXCISION    . HYDROCELE EXCISION Right   . QUADRICEPS TENDON REPAIR Right 01/14/2018   Procedure: REPAIR RIGHT QUADRICEPS TENDON;  Surgeon: Leandrew Koyanagi, MD;  Location: [place];  Service: Orthopedics;  Laterality: Right;   Social History   Occupational History  . Not on file  Tobacco Use  . Smoking status: Never Smoker  . Smokeless tobacco: Never Used  Substance and Sexual Activity  . Alcohol use: Yes    Comment: social  . Drug use: No  . Sexual activity: Not on file

## 2018-04-12 ENCOUNTER — Ambulatory Visit: Payer: Medicare Other | Admitting: Physical Therapy

## 2018-04-12 ENCOUNTER — Encounter: Payer: Self-pay | Admitting: Physical Therapy

## 2018-04-12 DIAGNOSIS — M25561 Pain in right knee: Secondary | ICD-10-CM

## 2018-04-12 DIAGNOSIS — R2689 Other abnormalities of gait and mobility: Secondary | ICD-10-CM | POA: Diagnosis not present

## 2018-04-12 DIAGNOSIS — M25661 Stiffness of right knee, not elsewhere classified: Secondary | ICD-10-CM

## 2018-04-12 DIAGNOSIS — G8929 Other chronic pain: Secondary | ICD-10-CM

## 2018-04-12 DIAGNOSIS — R600 Localized edema: Secondary | ICD-10-CM | POA: Diagnosis not present

## 2018-04-12 NOTE — Therapy (Signed)
[place], [place], [address] Phone: [phone]   Fax:  [phone]  Physical Therapy Treatment  Patient Details  Name: Adam Gonzales MRN: [id number] Date of Birth: [birth year] Referring Provider (PT): Dr Frankey Shown    Encounter Date: 04/12/2018  PT End of Session - 04/12/18 0916    Visit Number  10    Number of Visits  16    Date for PT Re-Evaluation  05/02/18    Authorization Type  [employer] Medicare     PT Start Time  0845    PT Stop Time  0924    PT Time Calculation (min)  39 min    Activity Tolerance  Patient tolerated treatment well    Behavior During Therapy  [facility] for tasks assessed/performed       Past Medical History:  Diagnosis Date  . 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 Gout   . Hypertension   . Quadriceps tendon rupture, right, initial encounter     Past Surgical History:  Procedure Laterality Date  . CYST EXCISION    . HYDROCELE EXCISION Right   . QUADRICEPS TENDON REPAIR Right 01/14/2018   Procedure: REPAIR RIGHT QUADRICEPS TENDON;  Surgeon: Leandrew Koyanagi, MD;  Location: [place];  Service: Orthopedics;  Laterality: Right;    There were no vitals filed for this visit.  Subjective Assessment - 04/12/18 0848    Subjective  Patient has no complaints. He has been cleared to go back to work. He is cleared to drive. He had no pain after the last visit.     Patient is accompained by:  Family member    Pertinent History  Gout     Limitations  Sitting    How long can you stand comfortably?  limited compared to baseline     How long can you walk comfortably?  limited household distance     Diagnostic tests  nothing post op    Patient Stated Goals  to get back to the gym and work     Currently in Pain?  No/denies                       [facility] Adult PT Treatment/Exercise - 04/12/18 0001      Knee/Hip Exercises: Stretches   Active Hamstring Stretch  Limitations  3x20 sec hold       Knee/Hip Exercises: Aerobic   Nustep  5 min to tolerance       Knee/Hip Exercises: Machines for Strengthening   Other Machine  hip 3 way 12.5 pounds bilateral       Knee/Hip Exercises: Standing   Heel Raises Limitations  2x20 off hip machine     Hip Flexion Limitations  standing march 2x10     Lateral Step Up  20 reps;Right;Hand Hold: 1;Step Height: 4"    Forward Step Up Limitations  8 inch 2x10     Other Standing Knee Exercises  3 way hip x20 seach way 2lb weight     Other Standing Knee Exercises  step onto air-ex 2x10 foward and lateral' single leg stance with cone touch single hand UE support.       Knee/Hip Exercises: Seated   Long Arc Quad Limitations  3x10       Knee/Hip Exercises: Supine   Straight Leg Raises Limitations  x20 3lb     Other Supine Knee/Hip Exercises  supine clam shell x30 green  Manual Therapy   Manual therapy comments  gentle ROM avoiding end range; patellar mobilization     Passive ROM  IT band release with gentle passive knee flexion              PT Education - 04/12/18 0853    Education Details  reviewed HEP, symptom managment   (Pended)     Person(s) Educated  Patient  (Pended)     Methods  Explanation;Tactile cues;Demonstration;Verbal cues  (Pended)     Comprehension  Verbalized understanding;Returned demonstration;Verbal cues required;Tactile cues required  (Pended)        PT Short Term Goals - 04/12/18 0919      PT SHORT TERM GOAL #1   Title  Patient will increase passive knee flexion by 15 degrees    Time  8    Period  Weeks    Status  On-going      PT SHORT TERM GOAL #2   Title  Patient will increase right knee flexion and extension strength to 4+/5     Time  8    Period  Weeks    Status  On-going      PT SHORT TERM GOAL #3   Title  Patient will ambaulte 400' with good posutre and improved single leg stance time without AD    Time  4    Period  Weeks    Status  On-going        PT  Long Term Goals - 03/07/18 1659      PT LONG TERM GOAL #1   Title  Patient will go up/down 8 steps with reciprocal gait pattern safely     Time  8    Period  Weeks    Status  New    Target Date  05/02/18      PT LONG TERM GOAL #2   Title  Patient will return to the gym with aprogram to promote strength and endurance     Time  8    Period  Weeks    Status  New    Target Date  05/02/18      PT LONG TERM GOAL #3   Title  Patient will ambualte 3000' without AD in order to perfrom IADL's     Time  8    Period  Weeks    Status  New    Target Date  05/02/18            Plan - 04/12/18 6195    Clinical Impression Statement  Patient continues to make great progress. He had no pain with his exercises just stiffness. He worked on the 3 way hip machine today without pain. Therapy has advanced him to a full step. Patients motion was a little more limited today but overall it is progressing well. He will return to the gym. He was advised to avoiid the leg press right now and LAQ.     Stability/Clinical Decision Making  Stable/Uncomplicated    Clinical Decision Making  Low    Rehab Potential  Good    PT Frequency  2x / week    PT Duration  8 weeks    PT Treatment/Interventions  ADLs/Self Care Home Management;Cryotherapy;Electrical Stimulation;Iontophoresis 4mg /ml Dexamethasone;Ultrasound;Gait training;Functional mobility training;Therapeutic activities;Therapeutic exercise;Cognitive remediation;Neuromuscular re-education;Patient/family education;Manual techniques;Passive range of motion;Dry needling;Energy conservation;Taping;Balance training    PT Next Visit Plan  continue with light range of motion. Dont push end range. SAQ, SLR; Clam shell; standing march; balance exercises; ambualtion with a cane.  Continue to not strees anything past 40 degrees of flexion.     PT Home Exercise Plan  advised to continue with HEP; AAROM heel slide with strap; standing march     Consulted and Agree with Plan  of Care  Patient       Patient will benefit from skilled therapeutic intervention in order to improve the following deficits and impairments:  Abnormal gait, Pain, Decreased activity tolerance, Decreased endurance, Decreased strength, Decreased range of motion, Difficulty walking, Increased edema, Impaired flexibility, Decreased mobility  Visit Diagnosis: Stiffness of right knee, not elsewhere classified  Chronic pain of right knee  Other abnormalities of gait and mobility  Localized edema     Problem List Patient Active Problem List   Diagnosis Date Noted  . Rupture of right quadriceps tendon 01/14/2018  . 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 HTN (hypertension) 01/02/2018  . Acute monoarthritis 01/01/2018  . 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

## 2018-04-15 ENCOUNTER — Ambulatory Visit: Payer: Medicare Other | Admitting: Physical Therapy

## 2018-04-15 ENCOUNTER — Encounter: Payer: Self-pay | Admitting: Physical Therapy

## 2018-04-15 DIAGNOSIS — M25561 Pain in right knee: Secondary | ICD-10-CM | POA: Diagnosis not present

## 2018-04-15 DIAGNOSIS — R2689 Other abnormalities of gait and mobility: Secondary | ICD-10-CM | POA: Diagnosis not present

## 2018-04-15 DIAGNOSIS — M25661 Stiffness of right knee, not elsewhere classified: Secondary | ICD-10-CM | POA: Diagnosis not present

## 2018-04-15 DIAGNOSIS — R600 Localized edema: Secondary | ICD-10-CM | POA: Diagnosis not present

## 2018-04-15 DIAGNOSIS — G8929 Other chronic pain: Secondary | ICD-10-CM | POA: Diagnosis not present

## 2018-04-15 NOTE — Therapy (Signed)
[place], [place], [address] Phone: [phone]   Fax:  [phone]  Physical Therapy Treatment  Patient Details  Name: Adam Gonzales MRN: [id number] Date of Birth: 08-23-1945 Referring Provider (PT): Dr Frankey Shown   Progress Note Reporting Period 03/07/2018 to 04/14/3028 See note below for Objective Data and Assessment of Progress/Goals.       Encounter Date: 04/15/2018  PT End of Session - 04/15/18 1352    Visit Number  11    Number of Visits  16    Date for PT Re-Evaluation  05/02/18    Authorization Type  [employer] Medicare     PT Start Time  1330    PT Stop Time  1412    PT Time Calculation (min)  42 min    Activity Tolerance  Patient tolerated treatment well    Behavior During Therapy  [facility] for tasks assessed/performed       Past Medical History:  Diagnosis Date  . 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 Gout   . Hypertension   . Quadriceps tendon rupture, right, initial encounter     Past Surgical History:  Procedure Laterality Date  . CYST EXCISION    . HYDROCELE EXCISION Right   . QUADRICEPS TENDON REPAIR Right 01/14/2018   Procedure: REPAIR RIGHT QUADRICEPS TENDON;  Surgeon: Leandrew Koyanagi, MD;  Location: [place];  Service: Orthopedics;  Laterality: Right;    There were no vitals filed for this visit.  Subjective Assessment - 04/15/18 1333    Subjective  Patient has no complaints. He has a little stiffness but it is getting better. He is rarely using the cane.     Patient is accompained by:  Family member    Pertinent History  Gout     Limitations  Sitting    How long can you stand comfortably?  limited compared to baseline     How long can you walk comfortably?  limited household distance     Diagnostic tests  nothing post op    Patient Stated Goals  to get back to the gym and work     Currently in Pain?  No/denies         [facility] PT Assessment - 04/15/18 0001       AROM   Right Knee Extension  0    Right Knee Flexion  105      PROM   Right Knee Extension  0    Right Knee Flexion  113      Strength   Overall Strength Comments  4+/5 gross hip and knee right       Ambulation/Gait   Gait Comments  ambualting without cane without much antalgic gait.                    [place] Adult PT Treatment/Exercise - 04/15/18 0001      Knee/Hip Exercises: Aerobic   Nustep  5 min to tolerance       Knee/Hip Exercises: Standing   Heel Raises Limitations  2x20 off hip machine     Hip Flexion Limitations  standing march 2x10     Lateral Step Up  20 reps;Right;Hand Hold: 1;Step Height: 4"    Forward Step Up Limitations  8 inch 2x10     Step Down  10 reps;Right;Step Height: 4"    Other Standing Knee Exercises  to stool kettle bell suqat 3x10 2 sets 15 lbs 1 set  25 lbs; kellte bell swing 2x10     Other Standing Knee Exercises  step onto air-ex 2x10 foward and lateral' single leg stance with cone touch single hand UE support.       Knee/Hip Exercises: Seated   Long Arc Quad Limitations  3x10 3 lb       Knee/Hip Exercises: Supine   Straight Leg Raises Limitations  x20 3lb     Other Supine Knee/Hip Exercises  supine clam shell x30 green              PT Education - 04/15/18 1335    Education Details  reviewed HEP, symptom mangement; technique with ther-ex.     Person(s) Educated  Patient    Methods  Explanation;Demonstration;Tactile cues;Verbal cues    Comprehension  Verbalized understanding;Verbal cues required;Returned demonstration;Tactile cues required       PT Short Term Goals - 04/12/18 0919      PT SHORT TERM GOAL #1   Title  Patient will increase passive knee flexion by 15 degrees    Time  8    Period  Weeks    Status  On-going      PT SHORT TERM GOAL #2   Title  Patient will increase right knee flexion and extension strength to 4+/5     Time  8    Period  Weeks    Status  On-going      PT SHORT TERM GOAL #3    Title  Patient will ambaulte 400' with good posutre and improved single leg stance time without AD    Time  4    Period  Weeks    Status  On-going        PT Long Term Goals - 04/15/18 1616      PT LONG TERM GOAL #1   Title  Patient will go up/down 8 steps with reciprocal gait pattern safely     Baseline  started doing stepdowns today     Time  8    Period  Weeks    Status  On-going      PT LONG TERM GOAL #2   Title  Patient will return to the gym with a program to promote strength and endurance     Baseline  just got membership     Time  8    Period  Weeks    Status  On-going      PT LONG TERM GOAL #3   Title  Patient will ambualte 3000' without AD in order to perfrom IADL's     Baseline  not using cane     Time  8    Period  Weeks    Status  Achieved            Plan - 04/15/18 1354    Clinical Impression Statement  Patient is making good progress. He has progressed off a cane with just some stiffness. His range has improved significanty. He continues to habve some tightness. His FOTO score has not improved depsite being about where he should be as far as his function goes. herapy will continue for 2-3 more weeks. Therapy progressed his weights but kept him from 0-40 degrees of flexion. He was advised he can begin this at the gym but dont go to far down and stay with a 15-20 lb weight.     Stability/Clinical Decision Making  Stable/Uncomplicated    Rehab Potential  Good    PT Frequency  2x / week  PT Duration  8 weeks    PT Treatment/Interventions  ADLs/Self Care Home Management;Cryotherapy;Electrical Stimulation;Iontophoresis 4mg /ml Dexamethasone;Ultrasound;Gait training;Functional mobility training;Therapeutic activities;Therapeutic exercise;Cognitive remediation;Neuromuscular re-education;Patient/family education;Manual techniques;Passive range of motion;Dry needling;Energy conservation;Taping;Balance training    PT Next Visit Plan  continue with light range of  motion. Dont push end range. SAQ, SLR; Clam shell; standing march; balance exercises; ambualtion with a cane. Continue to not strees anything past 40 degrees of flexion.     PT Home Exercise Plan  advised to continue with HEP; AAROM heel slide with strap; standing march     Consulted and Agree with Plan of Care  Patient       Patient will benefit from skilled therapeutic intervention in order to improve the following deficits and impairments:  Abnormal gait, Pain, Decreased activity tolerance, Decreased endurance, Decreased strength, Decreased range of motion, Difficulty walking, Increased edema, Impaired flexibility, Decreased mobility  Visit Diagnosis: Stiffness of right knee, not elsewhere classified  Chronic pain of right knee  Other abnormalities of gait and mobility  Localized edema     Problem List Patient Active Problem List   Diagnosis Date Noted  . Rupture of right quadriceps tendon 01/14/2018  . 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 HTN (hypertension) 01/02/2018  . Acute monoarthritis 01/01/2018  . 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 [place], [place], [address] Phone: [phone]   Fax:  [phone]  Name: Adam Gonzales MRN: [id number] Date of Birth: 17-Sep-1945

## 2018-04-19 ENCOUNTER — Other Ambulatory Visit: Payer: Self-pay

## 2018-04-19 ENCOUNTER — Ambulatory Visit: Payer: Medicare Other | Admitting: Physical Therapy

## 2018-04-19 DIAGNOSIS — G8929 Other chronic pain: Secondary | ICD-10-CM

## 2018-04-19 DIAGNOSIS — M25561 Pain in right knee: Secondary | ICD-10-CM | POA: Diagnosis not present

## 2018-04-19 DIAGNOSIS — R2689 Other abnormalities of gait and mobility: Secondary | ICD-10-CM

## 2018-04-19 DIAGNOSIS — R600 Localized edema: Secondary | ICD-10-CM | POA: Diagnosis not present

## 2018-04-19 DIAGNOSIS — M25661 Stiffness of right knee, not elsewhere classified: Secondary | ICD-10-CM | POA: Diagnosis not present

## 2018-04-19 NOTE — Therapy (Signed)
[place], [place], [address] Phone: [phone]   Fax:  [phone]  Physical Therapy Treatment  Patient Details  Name: Adam Gonzales MRN: [id number] Date of Birth: [birth year] Referring Provider (PT): Dr Frankey Shown    Encounter Date: 04/19/2018  PT End of Session - 04/19/18 0955    Visit Number  12    Number of Visits  16    Date for PT Re-Evaluation  05/02/18    Authorization Type  [employer] Medicare     PT Start Time  0930    PT Stop Time  1010    PT Time Calculation (min)  40 min    Activity Tolerance  Patient tolerated treatment well    Behavior During Therapy  [facility] for tasks assessed/performed       Past Medical History:  Diagnosis Date  . 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 Gout   . Hypertension   . Quadriceps tendon rupture, right, initial encounter     Past Surgical History:  Procedure Laterality Date  . CYST EXCISION    . HYDROCELE EXCISION Right   . QUADRICEPS TENDON REPAIR Right 01/14/2018   Procedure: REPAIR RIGHT QUADRICEPS TENDON;  Surgeon: Leandrew Koyanagi, MD;  Location: [place];  Service: Orthopedics;  Laterality: Right;    There were no vitals filed for this visit.                    [place] Adult PT Treatment/Exercise - 04/19/18 0001      Knee/Hip Exercises: Standing   Heel Raises Limitations  2x20 off hip machine     Hip Flexion Limitations  standing march 2x10     Lateral Step Up  20 reps;Right;Hand Hold: 1;Step Height: 4"    Forward Step Up Limitations  8 inch 2x10     Step Down  10 reps;Right;Step Height: 4"    Other Standing Knee Exercises  x20 3lb weight     Other Standing Knee Exercises  step onto air-ex 2x10 foward and lateral' single leg stance with cone touch single hand UE support.       Knee/Hip Exercises: Seated   Long Arc Quad Limitations  3x10 3 lb     Heel Slides Limitations  green 2x15      Knee/Hip Exercises: Supine   Straight Leg Raises Limitations  2x20 3lb     Other Supine Knee/Hip Exercises  supine clam shell x30 green                PT Short Term Goals - 04/12/18 0919      PT SHORT TERM GOAL #1   Title  Patient will increase passive knee flexion by 15 degrees    Time  8    Period  Weeks    Status  On-going      PT SHORT TERM GOAL #2   Title  Patient will increase right knee flexion and extension strength to 4+/5     Time  8    Period  Weeks    Status  On-going      PT SHORT TERM GOAL #3   Title  Patient will ambaulte 400' with good posutre and improved single leg stance time without AD    Time  4    Period  Weeks    Status  On-going        PT Long Term Goals - 04/15/18 1616      PT  LONG TERM GOAL #1   Title  Patient will go up/down 8 steps with reciprocal gait pattern safely     Baseline  started doing stepdowns today     Time  8    Period  Weeks    Status  On-going      PT LONG TERM GOAL #2   Title  Patient will return to the gym with a program to promote strength and endurance     Baseline  just got membership     Time  8    Period  Weeks    Status  On-going      PT LONG TERM GOAL #3   Title  Patient will ambualte 3000' without AD in order to perfrom IADL's     Baseline  not using cane     Time  8    Period  Weeks    Status  Achieved            Plan - 04/19/18 1001    Clinical Impression Statement  Patient continues to malkegreat progress. he will go back to work Monday., he was able to make full rotations on the bike. He is back to the gym. His range was measured at 120 degrees. Therapy anticipates discharge in the next 2 weeks.     Clinical Decision Making  Low    Rehab Potential  Good    PT Frequency  2x / week    PT Duration  8 weeks    PT Treatment/Interventions  ADLs/Self Care Home Management;Cryotherapy;Electrical Stimulation;Iontophoresis 4mg /ml Dexamethasone;Ultrasound;Gait training;Functional mobility training;Therapeutic  activities;Therapeutic exercise;Cognitive remediation;Neuromuscular re-education;Patient/family education;Manual techniques;Passive range of motion;Dry needling;Energy conservation;Taping;Balance training    PT Next Visit Plan  continue with light range of motion. Dont push end range. SAQ, SLR; Clam shell; standing march; balance exercises; ambualtion with a cane. Continue to not strees anything past 40 degrees of flexion.     PT Home Exercise Plan  advised to continue with HEP; AAROM heel slide with strap; standing march     Consulted and Agree with Plan of Care  Patient       Patient will benefit from skilled therapeutic intervention in order to improve the following deficits and impairments:  Abnormal gait, Pain, Decreased activity tolerance, Decreased endurance, Decreased strength, Decreased range of motion, Difficulty walking, Increased edema, Impaired flexibility, Decreased mobility  Visit Diagnosis: Stiffness of right knee, not elsewhere classified  Chronic pain of right knee  Other abnormalities of gait and mobility  Localized edema     Problem List Patient Active Problem List   Diagnosis Date Noted  . Rupture of right quadriceps tendon 01/14/2018  . 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 HTN (hypertension) 01/02/2018  . Acute monoarthritis 01/01/2018  . 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 [place], [place], [address] Phone: [phone]   Fax:  [phone]  Name: Adam Gonzales MRN: [id number] Date of Birth: 04/23/1945

## 2018-04-22 ENCOUNTER — Ambulatory Visit: Payer: Medicare Other | Admitting: Physical Therapy

## 2018-04-22 ENCOUNTER — Other Ambulatory Visit: Payer: Self-pay

## 2018-04-22 ENCOUNTER — Encounter: Payer: Self-pay | Admitting: Physical Therapy

## 2018-04-22 DIAGNOSIS — M25561 Pain in right knee: Secondary | ICD-10-CM | POA: Diagnosis not present

## 2018-04-22 DIAGNOSIS — R600 Localized edema: Secondary | ICD-10-CM | POA: Diagnosis not present

## 2018-04-22 DIAGNOSIS — R2689 Other abnormalities of gait and mobility: Secondary | ICD-10-CM | POA: Diagnosis not present

## 2018-04-22 DIAGNOSIS — M25661 Stiffness of right knee, not elsewhere classified: Secondary | ICD-10-CM | POA: Diagnosis not present

## 2018-04-22 DIAGNOSIS — G8929 Other chronic pain: Secondary | ICD-10-CM

## 2018-04-23 ENCOUNTER — Encounter: Payer: Self-pay | Admitting: Physical Therapy

## 2018-04-23 NOTE — Therapy (Addendum)
[place], [place], [address] Phone: [phone]   Fax:  [phone]  Physical Therapy Treatment/Discharge   Patient Details  Name: Adam Gonzales MRN: [id number] Date of Birth: 1945/11/02 Referring Provider (PT): Dr Frankey Shown    Encounter Date: 04/22/2018  PT End of Session - 04/23/18 0821    Visit Number  13    Number of Visits  16    Date for PT Re-Evaluation  05/02/18    Authorization Type  [employer]     PT Start Time  0845    PT Stop Time  0923    PT Time Calculation (min)  38 min    Activity Tolerance  Patient tolerated treatment well    Behavior During Therapy  [facility] for tasks assessed/performed       Past Medical History:  Diagnosis Date  . 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 Gout   . Hypertension   . Quadriceps tendon rupture, right, initial encounter     Past Surgical History:  Procedure Laterality Date  . CYST EXCISION    . HYDROCELE EXCISION Right   . QUADRICEPS TENDON REPAIR Right 01/14/2018   Procedure: REPAIR RIGHT QUADRICEPS TENDON;  Surgeon: Leandrew Koyanagi, MD;  Location: [facility];  Service: Orthopedics;  Laterality: Right;    There were no vitals filed for this visit.  Subjective Assessment - 04/23/18 0820    Subjective  Patient has no complaints. He has been going to the gym and riding the bike. He is back to work.     Patient is accompained by:  Family member    Pertinent History  Gout     Limitations  Sitting    How long can you stand comfortably?  limited compared to baseline     How long can you walk comfortably?  limited household distance     Diagnostic tests  nothing post op    Patient Stated Goals  to get back to the gym and work     Currently in Pain?  No/denies                       [facility] Adult PT Treatment/Exercise - 04/23/18 0001      Knee/Hip Exercises: Aerobic   Nustep  5 min to tolerance       Knee/Hip Exercises:  Standing   Heel Raises Limitations  2x20 off hip machine     Hip Flexion Limitations  standing march 2x10 2lb     Lateral Step Up  20 reps;Right;Hand Hold: 1;Step Height: 8"    Forward Step Up  20 reps;Hand Hold: 2;Step Height: 8"    Step Down  10 reps;Right;Step Height: 4"    Other Standing Knee Exercises  x20 3lb weight; lateral band walk green x20; forward band walk x20 green     Other Standing Knee Exercises  step onto air-ex 2x10 foward and lateral' single leg stance with cone touch single hand UE support.       Knee/Hip Exercises: Seated   Long Arc Quad Limitations  3x10 3 lb     Heel Slides Limitations  green 2x15      Knee/Hip Exercises: Supine   Straight Leg Raises Limitations  2x20 3lb     Other Supine Knee/Hip Exercises  supine clam shell x30 green              PT Education - 04/23/18 0820    Education  Details  HEP, symptom management     Person(s) Educated  Patient    Methods  Explanation;Tactile cues;Verbal cues;Demonstration;Handout    Comprehension  Verbalized understanding;Returned demonstration;Verbal cues required;Tactile cues required       PT Short Term Goals - 04/12/18 0919      PT SHORT TERM GOAL #1   Title  Patient will increase passive knee flexion by 15 degrees    Time  8    Period  Weeks    Status  On-going      PT SHORT TERM GOAL #2   Title  Patient will increase right knee flexion and extension strength to 4+/5     Time  8    Period  Weeks    Status  On-going      PT SHORT TERM GOAL #3   Title  Patient will ambaulte 400' with good posutre and improved single leg stance time without AD    Time  4    Period  Weeks    Status  On-going        PT Long Term Goals - 04/15/18 1616      PT LONG TERM GOAL #1   Title  Patient will go up/down 8 steps with reciprocal gait pattern safely     Baseline  started doing stepdowns today     Time  8    Period  Weeks    Status  On-going      PT LONG TERM GOAL #2   Title  Patient will return to  the gym with a program to promote strength and endurance     Baseline  just got membership     Time  8    Period  Weeks    Status  On-going      PT LONG TERM GOAL #3   Title  Patient will ambualte 3000' without AD in order to perfrom IADL's     Baseline  not using cane     Time  8    Period  Weeks    Status  Achieved            Plan - 04/23/18 7001    Clinical Impression Statement  Patient continues to make great progress. Therapy added lateral band walks with greenm band. he has a full gym program he is perfroming. He will likley discharge next visit.     Stability/Clinical Decision Making  Stable/Uncomplicated    Clinical Decision Making  Low    Rehab Potential  Good    PT Frequency  2x / week    PT Duration  8 weeks    PT Treatment/Interventions  ADLs/Self Care Home Management;Cryotherapy;Electrical Stimulation;Iontophoresis '4mg'$ /ml Dexamethasone;Ultrasound;Gait training;Functional mobility training;Therapeutic activities;Therapeutic exercise;Cognitive remediation;Neuromuscular re-education;Patient/family education;Manual techniques;Passive range of motion;Dry needling;Energy conservation;Taping;Balance training    PT Next Visit Plan  continue with light range of motion. Dont push end range. SAQ, SLR; Clam shell; standing march; balance exercises; ambualtion with a cane. Continue to not strees anything past 40 degrees of flexion.     PT Home Exercise Plan  advised to continue with HEP; AAROM heel slide with strap; standing march     Consulted and Agree with Plan of Care  Patient       Patient will benefit from skilled therapeutic intervention in order to improve the following deficits and impairments:  Abnormal gait, Pain, Decreased activity tolerance, Decreased endurance, Decreased strength, Decreased range of motion, Difficulty walking, Increased edema, Impaired flexibility, Decreased mobility  Visit Diagnosis: Stiffness of  right knee, not elsewhere classified  Chronic pain  of right knee  Other abnormalities of gait and mobility  Localized edema  PHYSICAL THERAPY DISCHARGE SUMMARY  Visits from Start of Care: 13  Current functional level related to goals / functional outcome Significant improvement in function    Remaining deficits: Minor weakness    Education / Equipment: HEP   Plan: Patient agrees to discharge.  Patient goals were met. Patient is being discharged due to meeting the stated rehab goals.  ?????       Problem List Patient Active Problem List   Diagnosis Date Noted  . Rupture of right quadriceps tendon 01/14/2018  . 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 HTN (hypertension) 01/02/2018  . Acute monoarthritis 01/01/2018  . 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

## 2018-04-26 ENCOUNTER — Ambulatory Visit: Payer: Medicare Other | Admitting: Physical Therapy

## 2018-04-29 ENCOUNTER — Ambulatory Visit: Payer: Medicare Other | Admitting: Physical Therapy

## 2018-05-02 ENCOUNTER — Ambulatory Visit: Payer: Medicare Other | Admitting: Physical Therapy

## 2018-05-20 DIAGNOSIS — I10 Essential (primary) hypertension: Secondary | ICD-10-CM | POA: Diagnosis not present

## 2018-07-10 ENCOUNTER — Encounter: Payer: Self-pay | Admitting: Orthopaedic Surgery

## 2018-07-10 ENCOUNTER — Ambulatory Visit (INDEPENDENT_AMBULATORY_CARE_PROVIDER_SITE_OTHER): Payer: Medicare Other | Admitting: Orthopaedic Surgery

## 2018-07-10 ENCOUNTER — Other Ambulatory Visit: Payer: Self-pay

## 2018-07-10 DIAGNOSIS — S76111D Strain of right quadriceps muscle, fascia and tendon, subsequent encounter: Secondary | ICD-10-CM | POA: Diagnosis not present

## 2018-07-10 NOTE — Progress Notes (Signed)
Patient ID: Adam Gonzales, male   DOB: August 03, 1945, 73 y.o.   MRN: [id number]  Adam Gonzales comes in for his 21-month postoperative visit status post right quadriceps repair.  He is doing well overall he just has some weakness with descending hills but other than that he has great range of motion and no pain.  He is continuing to work on strength.  His physical exam shows full range of motion.  Surgical scar is fully healed.  He has 5 out of 5 quadriceps strength.  At this point Adam Gonzales has done very well and recovered very nicely.  He will continue to work on strengthening and he may introduce any physical activity that he feels comfortable.  At this point I will release him.  Follow-up as needed.

## 2018-07-12 DIAGNOSIS — H903 Sensorineural hearing loss, bilateral: Secondary | ICD-10-CM | POA: Diagnosis not present

## 2018-07-12 DIAGNOSIS — H6123 Impacted cerumen, bilateral: Secondary | ICD-10-CM | POA: Diagnosis not present

## 2018-08-16 DIAGNOSIS — R972 Elevated prostate specific antigen [PSA]: Secondary | ICD-10-CM | POA: Diagnosis not present

## 2018-08-22 DIAGNOSIS — R972 Elevated prostate specific antigen [PSA]: Secondary | ICD-10-CM | POA: Diagnosis not present

## 2018-08-22 DIAGNOSIS — R311 Benign essential microscopic hematuria: Secondary | ICD-10-CM | POA: Diagnosis not present

## 2018-11-05 DIAGNOSIS — I129 Hypertensive chronic kidney disease with stage 1 through stage 4 chronic kidney disease, or unspecified chronic kidney disease: Secondary | ICD-10-CM | POA: Diagnosis not present

## 2018-11-05 DIAGNOSIS — E269 Hyperaldosteronism, unspecified: Secondary | ICD-10-CM | POA: Diagnosis not present

## 2018-11-05 DIAGNOSIS — N182 Chronic kidney disease, stage 2 (mild): Secondary | ICD-10-CM | POA: Diagnosis not present

## 2018-11-05 DIAGNOSIS — R319 Hematuria, unspecified: Secondary | ICD-10-CM | POA: Diagnosis not present

## 2018-11-20 DIAGNOSIS — R972 Elevated prostate specific antigen [PSA]: Secondary | ICD-10-CM | POA: Diagnosis not present

## 2018-11-28 DIAGNOSIS — R311 Benign essential microscopic hematuria: Secondary | ICD-10-CM | POA: Diagnosis not present

## 2018-11-28 DIAGNOSIS — R972 Elevated prostate specific antigen [PSA]: Secondary | ICD-10-CM | POA: Diagnosis not present

## 2018-11-29 ENCOUNTER — Other Ambulatory Visit: Payer: Self-pay | Admitting: Urology

## 2018-11-29 DIAGNOSIS — R972 Elevated prostate specific antigen [PSA]: Secondary | ICD-10-CM

## 2018-12-11 DIAGNOSIS — N200 Calculus of kidney: Secondary | ICD-10-CM | POA: Diagnosis not present

## 2018-12-11 DIAGNOSIS — R311 Benign essential microscopic hematuria: Secondary | ICD-10-CM | POA: Diagnosis not present

## 2018-12-18 ENCOUNTER — Other Ambulatory Visit: Payer: Self-pay

## 2018-12-18 ENCOUNTER — Ambulatory Visit
Admission: RE | Admit: 2018-12-18 | Discharge: 2018-12-18 | Disposition: A | Discharge status: Home / Self Care | Payer: Medicare Other | Source: Ambulatory Visit | Attending: Urology | Admitting: Urology

## 2018-12-18 DIAGNOSIS — R972 Elevated prostate specific antigen [PSA]: Secondary | ICD-10-CM

## 2018-12-18 DIAGNOSIS — N429 Disorder of prostate, unspecified: Secondary | ICD-10-CM | POA: Diagnosis not present

## 2018-12-18 MED ORDER — GADOBENATE DIMEGLUMINE 529 MG/ML IV SOLN
17.0000 mL | Freq: Once | INTRAVENOUS | Status: AC | PRN
  Administered 2018-12-18: 17 mL via INTRAVENOUS

## 2018-12-19 DIAGNOSIS — R972 Elevated prostate specific antigen [PSA]: Secondary | ICD-10-CM | POA: Diagnosis not present

## 2018-12-24 IMAGING — CT CT ANGIO EXTREM LOW*R*
2 of 5 series · 12 of 46 positions shown, 14 images · IV contrast (iopamidol)
Comparison: Right knee radiographs from earlier today. 01/21/2015
CT abdomen/pelvis.

CLINICAL DATA: Reported history of right patellar dislocation
status post reduction. Severe right knee pain.

EXAM:
CT ANGIOGRAPHY OF THE RIGHT LOWEREXTREMITY
TECHNIQUE: Multidetector CT imaging of the right lower extremity was performed
using the standard protocol during bolus administration of
intravenous contrast. Multiplanar CT image reconstructions and MIPs
were obtained to evaluate the vascular anatomy.
CONTRAST:  100mL [id number] IOPAMIDOL ([id number]) INJECTION 76%

[Series 5: axial arterial upper · axial · arterial · 0.49mm/px · z∈[-357,+654]mm · 9 of 389 slices shown, 11 images]
[im 26/389  soft-tissue]
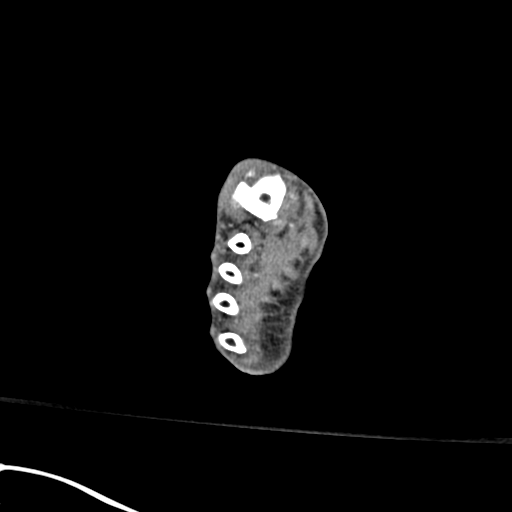
[im 26/389  bone]
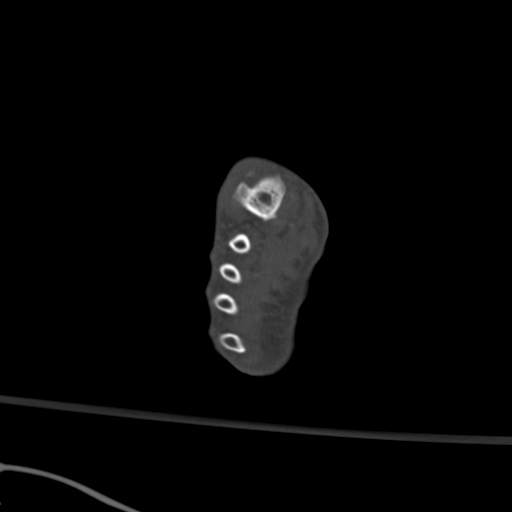
[im 76/389  soft-tissue]
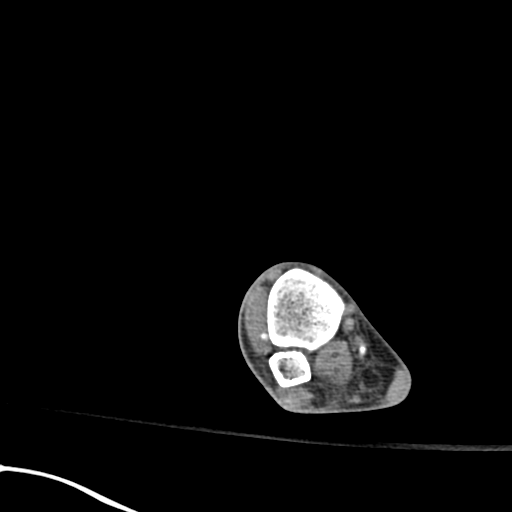
[im 113/389  soft-tissue]
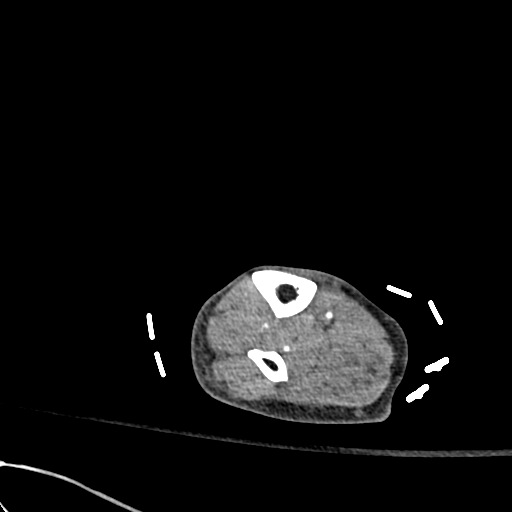
[im 151/389  soft-tissue]
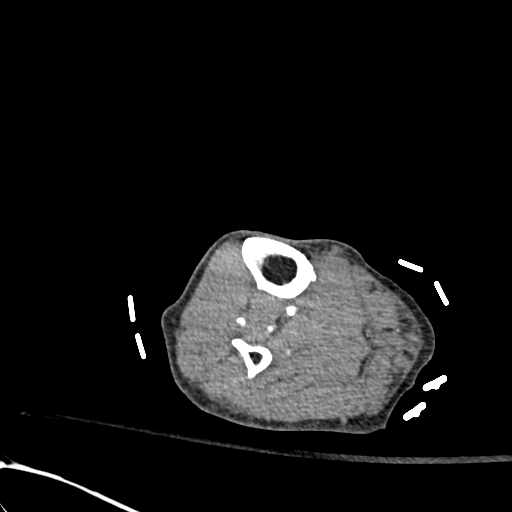
[im 201/389  soft-tissue]
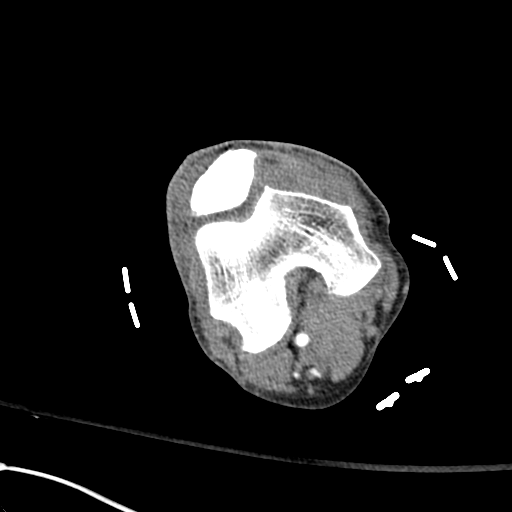
[im 238/389  soft-tissue]
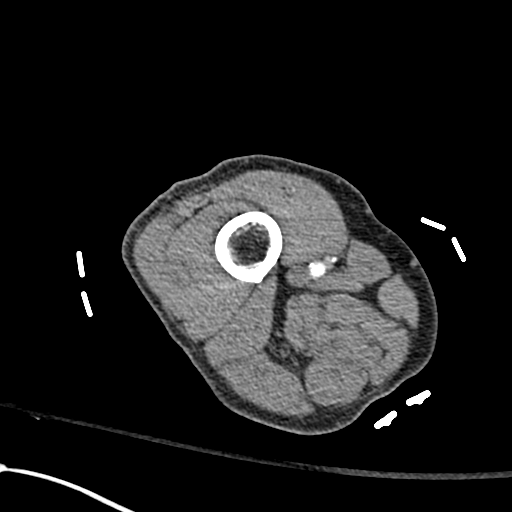
[im 276/389  soft-tissue]
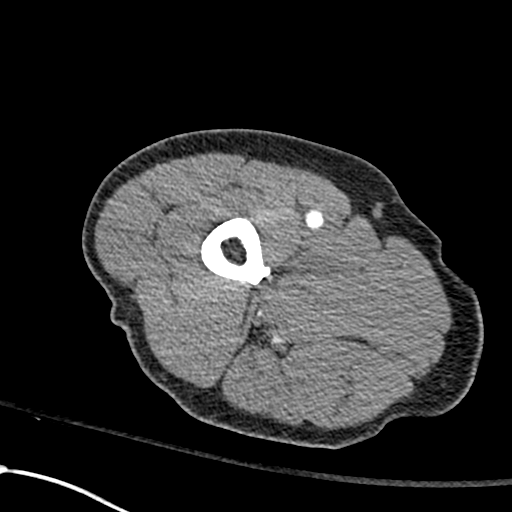
[im 326/389  soft-tissue]
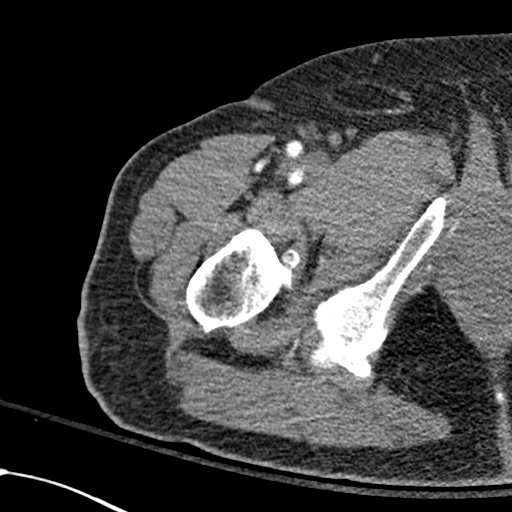
[im 363/389  soft-tissue]
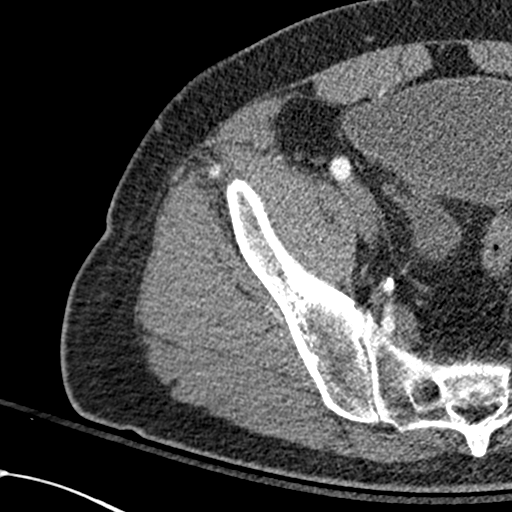
[im 363/389  bone]
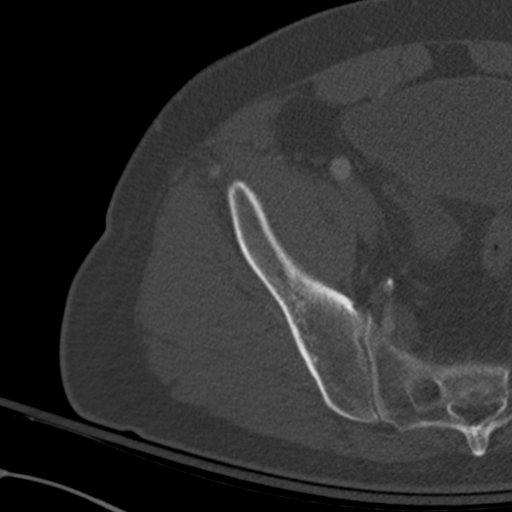

[Series 10: coronal upper · coronal · 0.50mm/px · 3 of 93 slices shown]
[im 24/93  soft-tissue]
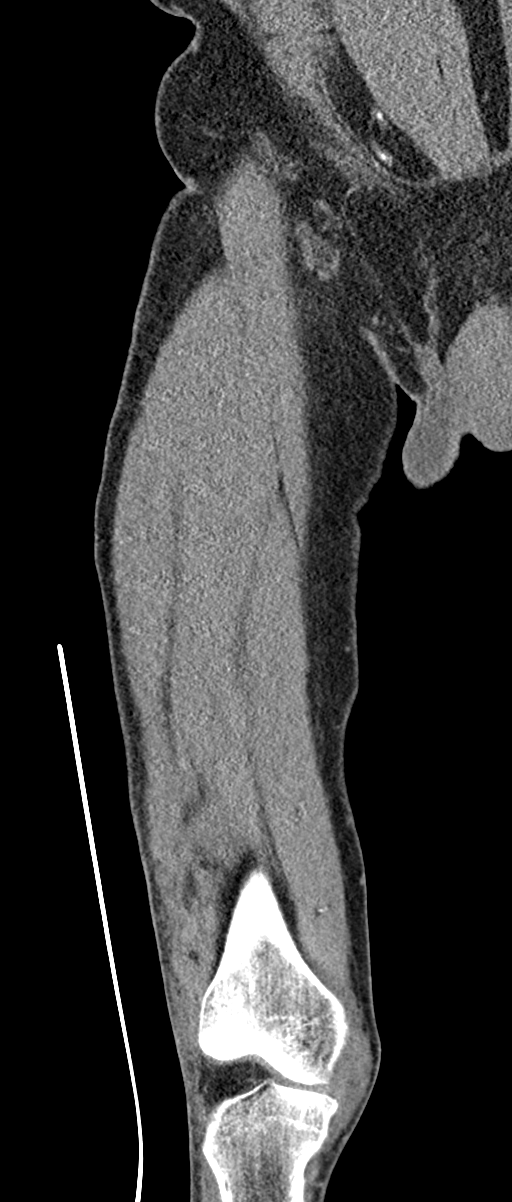
[im 47/93  soft-tissue]
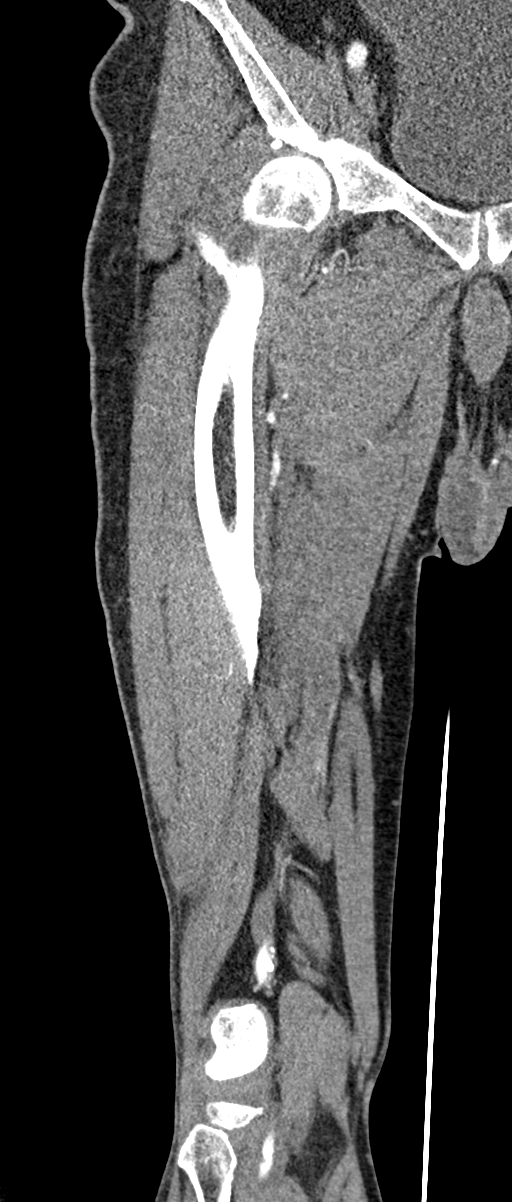
[im 70/93  soft-tissue]
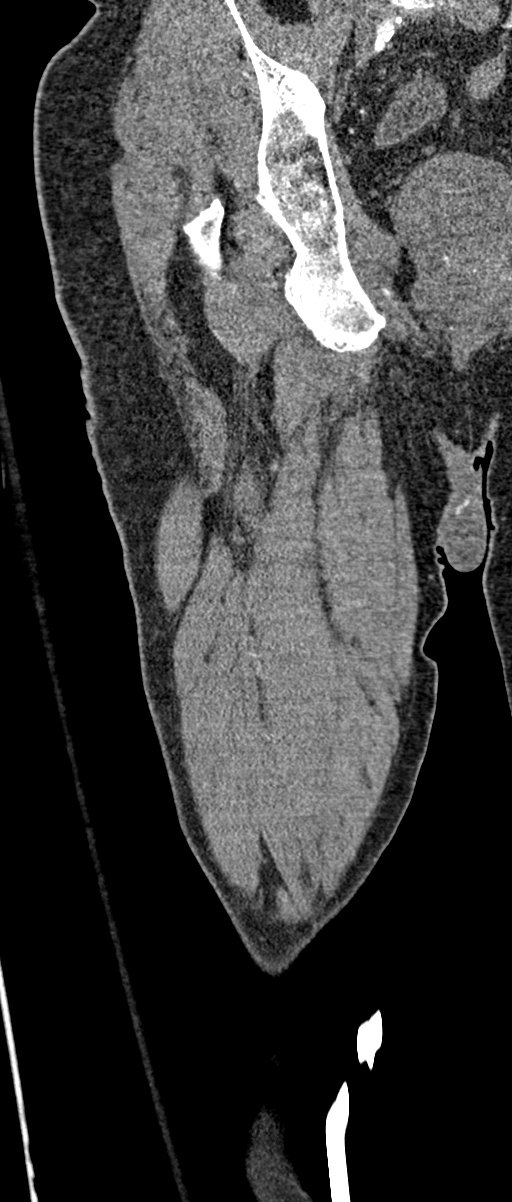

[12 of 46 positions shown; findings below may reference images not displayed]

FINDINGS: VASCULAR:

RIGHT LOWER EXTREMITY:

INFLOW: Mildly atherosclerotic nonaneurysmal right common iliac and
right external iliac arteries without significant stenosis or
pseudoaneurysm.

OUTFLOW: Mildly atherosclerotic nonaneurysmal right common femoral,
profunda and superficial femoral and popliteal arteries without
significant stenosis or pseudoaneurysm.

RUNOFF: Moderately atherosclerotic patent tibioperoneal trunk.
Posterior tibial artery is moderately atherosclerotic and patent
with runoff to the foot. Peroneal artery is occluded distally.
Anterior tibial artery is occluded at the level of the distal tibial
shaft with distal reconstitution by the perennial artery, with
patent dorsalis pedis artery. No evidence of deep venous thrombosis
in the visualized distal right lower extremity on the delayed venous
scans.

NON-VASCULAR:

There are clustered avulsion fracture fragments along the lateral
quadriceps tendon approximately 1.8 cm above the right superior
patellar margin. There is abnormal thickening of the quadriceps
tendon with small amount of fluid surrounding the patella and
quadriceps tendon. Prepatellar soft tissue swelling. No patellar or
knee dislocation. Minimal tricompartmental right knee
osteoarthritis.

No aggressive appearing focal osseous lesions. Marked lower lumbar
spondylosis.

Moderately enlarged prostate with nonspecific internal prostatic
calcifications. Right posterior bladder wall 4.8 x 2.8 cm
diverticulum. No pathologically enlarged lymph nodes in the
visualized right pelvis or right lower extremity.

Review of the MIP images confirms the above findings.
IMPRESSION: VASCULAR:

1. No acute vascular abnormality. No significant inflow or outflow
arterial stenosis in the right lower extremity. Two-vessel arterial
runoff to the right foot, as detailed.
2.  Aortic Atherosclerosis ([id number]).

NON-VASCULAR:

1. Avulsion injury of the quadriceps tendon with clustered avulsion
fracture fragments superolaterally displaced from the patella. Fluid
surrounding the quadriceps tendon and patella. No patellar or knee
dislocation.
2. Moderate prostatomegaly. Right posterior bladder wall
diverticulum.

## 2019-02-04 DIAGNOSIS — E269 Hyperaldosteronism, unspecified: Secondary | ICD-10-CM | POA: Diagnosis not present

## 2019-02-04 DIAGNOSIS — K635 Polyp of colon: Secondary | ICD-10-CM | POA: Diagnosis not present

## 2019-02-04 DIAGNOSIS — E785 Hyperlipidemia, unspecified: Secondary | ICD-10-CM | POA: Diagnosis not present

## 2019-02-04 DIAGNOSIS — R799 Abnormal finding of blood chemistry, unspecified: Secondary | ICD-10-CM | POA: Diagnosis not present

## 2019-02-04 DIAGNOSIS — I10 Essential (primary) hypertension: Secondary | ICD-10-CM | POA: Diagnosis not present

## 2019-02-04 DIAGNOSIS — R972 Elevated prostate specific antigen [PSA]: Secondary | ICD-10-CM | POA: Diagnosis not present

## 2019-02-18 DIAGNOSIS — R972 Elevated prostate specific antigen [PSA]: Secondary | ICD-10-CM | POA: Diagnosis not present

## 2019-02-18 DIAGNOSIS — C61 Malignant neoplasm of prostate: Secondary | ICD-10-CM | POA: Diagnosis not present

## 2019-02-18 DIAGNOSIS — R311 Benign essential microscopic hematuria: Secondary | ICD-10-CM | POA: Diagnosis not present

## 2019-02-18 DIAGNOSIS — R3121 Asymptomatic microscopic hematuria: Secondary | ICD-10-CM | POA: Diagnosis not present

## 2019-03-10 DIAGNOSIS — C61 Malignant neoplasm of prostate: Secondary | ICD-10-CM | POA: Diagnosis not present

## 2019-03-13 ENCOUNTER — Ambulatory Visit: Payer: Medicare Other

## 2019-03-13 ENCOUNTER — Telehealth: Payer: Self-pay | Admitting: Radiation Oncology

## 2019-03-13 ENCOUNTER — Ambulatory Visit
Admission: RE | Admit: 2019-03-13 | Discharge: 2019-03-13 | Disposition: A | Discharge status: Home / Self Care | Payer: Medicare Other | Source: Ambulatory Visit | Attending: Radiation Oncology | Admitting: Radiation Oncology

## 2019-03-13 ENCOUNTER — Encounter: Payer: Self-pay | Admitting: Radiation Oncology

## 2019-03-13 HISTORY — DX: Malignant neoplasm of prostate: C61

## 2019-03-13 NOTE — Progress Notes (Signed)
GU Location of Tumor / Histology: prostatic adenocarcinoma  If Prostate Cancer, Gleason Score is (3 + 4) and PSA is (21.30) October 2020. Prostate volume: 119 grams  Adam Gonzales had his initial prostate biopsy in 2006 which revealed low grade disease thus he opted for active surveillance. To date patient has had a total of six prostate biopsies.Unforuntely, his last biopsy revealed progression.  Biopsies of prostate (if applicable) revealed:   Past/Anticipated interventions by urology, if any: prostate biopsies x6, finasteride, bone scan ?, CT abdomen and pelvis (?), (referral for consideration of radiation therapy  Patient trying to decide between prostatectomy vs external beam xrt.  Past/Anticipated interventions by medical oncology, if any: no  Weight changes, if any: stable  Bowel/Bladder complaints, if any: IPSS 12. SHIM 1. Patient is not sexually active. Denies dysuria, hematuria, urinary leakage or incontinence. Denies any bowel complaints.  Nausea/Vomiting, if any: no  Pain issues, if any:  no  SAFETY ISSUES: Prior radiation? no Pacemaker/ICD? no Possible current pregnancy? no, male patient Is the patient on methotrexate? no  Current Complaints / other details:  74 year old male. Married. 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

## 2019-03-13 NOTE — Telephone Encounter (Signed)
Received voicemail message from patient confused about appointment. Noted patient was double booked for today and Tuesday. Explained his consult is in fact Tuesday, February 8 at 2:30 pm via telephone. Patient verbalized understanding of all reviewed and confirmed availability for 2/8.

## 2019-03-14 ENCOUNTER — Telehealth: Payer: Self-pay | Admitting: *Deleted

## 2019-03-14 ENCOUNTER — Telehealth: Payer: Self-pay | Admitting: Radiation Oncology

## 2019-03-14 NOTE — Telephone Encounter (Signed)
Received voicemail message from patient requesting return call. Phoned patient back. Patient wanting to ensure Dr. Tammi Klippel has his CT and MRI to review. Assured patient the urology office sent those items and Dr. Tammi Klippel will review them. Patient wanting to ensure nothing was overlooked or duplicated. Expressed appreciation for the information and confirmed again our consultation for Tuesday coming.

## 2019-03-14 NOTE — Telephone Encounter (Signed)
RETURNED PATIENT'S PHONE CALL, SPOKE WITH PATIENT. ?

## 2019-03-18 ENCOUNTER — Ambulatory Visit
Admission: RE | Admit: 2019-03-18 | Discharge: 2019-03-18 | Disposition: A | Discharge status: Home / Self Care | Payer: Medicare Other | Source: Ambulatory Visit | Attending: Radiation Oncology | Admitting: Radiation Oncology

## 2019-03-18 ENCOUNTER — Other Ambulatory Visit: Payer: Self-pay

## 2019-03-18 ENCOUNTER — Encounter: Payer: Self-pay | Admitting: Radiation Oncology

## 2019-03-18 VITALS — Ht 70.0 in | Wt 190.0 lb

## 2019-03-18 DIAGNOSIS — R9721 Rising PSA following treatment for malignant neoplasm of prostate: Secondary | ICD-10-CM | POA: Diagnosis not present

## 2019-03-18 DIAGNOSIS — C61 Malignant neoplasm of prostate: Secondary | ICD-10-CM

## 2019-03-18 DIAGNOSIS — Z79899 Other long term (current) drug therapy: Secondary | ICD-10-CM | POA: Diagnosis not present

## 2019-03-18 NOTE — Progress Notes (Signed)
GU Location of Tumor / Histology: prostatic adenocarcinoma  If Prostate Cancer, Gleason Score is (3 + 4) and PSA is (21.30) October 2020. Prostate volume: 119 grams  Adam Gonzales had his initial prostate biopsy in 2006 which revealed low grade disease thus he opted for active surveillance. To date patient has had a total of five prostate biopsies.Unforuntely, his last biopsy revealed progression.  Biopsies of prostate (if applicable) revealed:   Past/Anticipated interventions by urology, if any: prostate biopsies x5, finasteride, CT abdomen and pelvis (negative), referral for consideration of radiation therapy  Patient trying to decide between prostatectomy vs external beam xrt.  Past/Anticipated interventions by medical oncology, if any: no  Weight changes, if any: stable  Bowel/Bladder complaints, if any: IPSS 12. SHIM 1. Patient is not sexually active. Denies dysuria or hematuria. Denies urinary leakage or incontinence. Denies bowel complaints.   Nausea/Vomiting, if any: no  Pain issues, if any:  no  SAFETY ISSUES:  Prior radiation? no  Pacemaker/ICD? no  Possible current pregnancy? no, male patient  Is the patient on methotrexate? no  Current Complaints / other details:  74 year old male. Married. 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

## 2019-03-18 NOTE — Progress Notes (Signed)
Radiation Oncology         [phone] ________________________________  Initial outpatient Consultation - Conducted via Telephone due to current COVID-19 concerns for limiting patient exposure  Name: Adam Gonzales MRN: [id number]  Date: 03/18/2019  DOB: [birth year]  [id number], Algis Greenhouse, MD  Levin Erp, MD   REFERRING PHYSICIAN: Levin Erp, MD  DIAGNOSIS: 74 y.o. gentleman with Stage T1c adenocarcinoma of the prostate with Gleason score of 3+4, and PSA of 21.3 (42.6 adjusted for finasteride).    ICD-10-CM   1. Malignant neoplasm of prostate ([place])  C61     HISTORY OF PRESENT ILLNESS: Adam Gonzales is a 74 y.o. male with a diagnosis of prostate cancer. He was initially referred to Dr. Risa Grill for elevated PSA back in 2006 when his PSA was elevated at 6.34.  An initial biopsy performed on 07/15/2004 showed only focal HG/PIN.  Since that time, his PSA has fluctuated, peaking as high as 23.9 in 02/2017, during which time his care was transferred to Dr. Louis Meckel with Dr. Cy Blamer transition out of clinical practice. He has had multiple prostate biopsies which were negative, as recently as 2016 and had a prostate MRI in 11/2015 that did not show high grade disease or concerning features. He has been on Flomax and Finasteride for many years to help manage his LUTS and is currently still taking these.   More recently, his PSA again rose above 20 in 08/2018. A repeat PSA performed in 11/2018 remained elevated at 21.3, and he also developed hematuria around that time. He underwent abdomen/pelvis CT on 12/11/2018 for evaluation of the hematuria showing bilateral renal cysts and non-obstructing calculi with prostatomegaly, likely reflecting BPH and bladder findings consistent with chronic bladder outlet obstruction.  In office cystoscopy was without concerning findings but did note a very large median lobe with cystic lesions at the bladder neck and within the prostatic urethra likely responsible for the  microhematuria.  A repeat prostate MRI was also performed on 12/18/2018 revealing two PI-RADS 4 lesions on the right side of the prostate, and a prostate volume of 118.55 cc.   The patient proceeded to MRI fusion biopsy of the prostate on 02/18/2019.  The prostate volume measured 128 cc.  Out of 21 core biopsies, 6 were positive.  The maximum Gleason score was 3+4, and this was seen in four of the five right mid ROI lesion samples and the right base lateral core.  Additionally, Gleason 3+3 was seen in the right mid lateral core.  The patient reviewed the biopsy results with his urologist and he has kindly been referred today for discussion of potential radiation treatment options.   PREVIOUS RADIATION THERAPY: No  PAST MEDICAL HISTORY:  Past Medical History:  Diagnosis Date  . 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 Gout   . Hypertension   . Prostate cancer ([place])   . Quadriceps tendon rupture, right, initial encounter       PAST SURGICAL HISTORY: Past Surgical History:  Procedure Laterality Date  . CYST EXCISION    . HYDROCELE EXCISION Right   . PROSTATE BIOPSY    . QUADRICEPS TENDON REPAIR Right 01/14/2018   Procedure: REPAIR RIGHT QUADRICEPS TENDON;  Surgeon: Leandrew Koyanagi, MD;  Location: [facility];  Service: Orthopedics;  Laterality: Right;    FAMILY HISTORY:  Family History  Problem Relation Age of Onset  . Heart failure Mother   . Cancer - Colon Father   . Dementia Sister   . Throat cancer  Brother   . Pancreatic cancer Neg Hx   . Breast cancer Neg Hx   . Prostate cancer Neg Hx     SOCIAL HISTORY:  Social History   Socioeconomic History  . Marital status: Married    Spouse name: Not on file  . Number of children: Not on file  . Years of education: Not on file  . Highest education level: Not on file  Occupational History  . Not on file  Tobacco Use  . Smoking status: Never Smoker  . Smokeless tobacco: Never Used  Substance and Sexual Activity  .  Alcohol use: Yes    Comment: social  . Drug use: No  . Sexual activity: Not Currently  Other Topics Concern  . Not on file  Social History Narrative  . Not on file   Social Determinants of Health   Financial Resource Strain:   . Difficulty of Paying Living Expenses: Not on file  Food Insecurity:   . Worried About Charity fundraiser in the Last Year: Not on file  . Ran Out of Food in the Last Year: Not on file  Transportation Needs:   . Lack of Transportation (Medical): Not on file  . Lack of Transportation (Non-Medical): Not on file  Physical Activity:   . Days of Exercise per Week: Not on file  . Minutes of Exercise per Session: Not on file  Stress:   . Feeling of Stress : Not on file  Social Connections:   . Frequency of Communication with Friends and Family: Not on file  . Frequency of Social Gatherings with Friends and Family: Not on file  . Attends Religious Services: Not on file  . Active Member of Clubs or Organizations: Not on file  . Attends Archivist Meetings: Not on file  . Marital Status: Not on file  Intimate Partner Violence:   . Fear of Current or Ex-Partner: Not on file  . Emotionally Abused: Not on file  . Physically Abused: Not on file  . Sexually Abused: Not on file    ALLERGIES: Patient has no known allergies.  MEDICATIONS:  Current Outpatient Medications  Medication Sig Dispense Refill  . aspirin EC 81 MG tablet Take 81 mg by mouth daily.    . finasteride (PROSCAR) 5 MG tablet Take 5 mg by mouth daily.  10  . hydrochlorothiazide (HYDRODIURIL) 12.5 MG tablet Take 12.5 mg by mouth daily.    Marland Kitchen lisinopril (PRINIVIL,ZESTRIL) 40 MG tablet Take 40 mg by mouth daily.  2  . losartan (COZAAR) 25 MG tablet Take 25 mg by mouth 2 (two) times daily.    . meloxicam (MOBIC) 15 MG tablet Take 15 mg by mouth daily.    Marland Kitchen spironolactone (ALDACTONE) 25 MG tablet Take 25 mg by mouth daily.    . Tamsulosin HCl (FLOMAX) 0.4 MG CAPS Take 0.4 mg by mouth  daily.     Marland Kitchen acetaminophen (TYLENOL) 500 MG tablet Take 500 mg by mouth every 6 (six) hours as needed.    . COLCRYS 0.6 MG tablet TAKE ONE TABLET BY MOUTH TWICE A DAY AS NEEDED (Patient not taking: Reported on 07/10/2018) 20 tablet 0  . diazepam (VALIUM) 5 MG tablet Take 1 tablet (5 mg total) by mouth every 6 (six) hours as needed for anxiety. (Patient not taking: Reported on 07/10/2018) 30 tablet 0  . HYDROcodone-acetaminophen (NORCO) 5-325 MG tablet Take 1-2 tablets by mouth daily as needed. (Patient not taking: Reported on 07/10/2018) 30 tablet 0  .  HYDROcodone-acetaminophen (NORCO/VICODIN) 5-325 MG tablet Take 1 tablet by mouth every 6 (six) hours as needed for moderate pain. (Patient not taking: Reported on 07/10/2018) 20 tablet 0  . methocarbamol (ROBAXIN) 750 MG tablet Take 1 tablet (750 mg total) by mouth 2 (two) times daily as needed for muscle spasms. (Patient not taking: Reported on 07/10/2018) 60 tablet 0  . ondansetron (ZOFRAN) 4 MG tablet Take 1-2 tablets (4-8 mg total) by mouth every 8 (eight) hours as needed for nausea or vomiting. (Patient not taking: Reported on 07/10/2018) 40 tablet 0  . oxyCODONE (ROXICODONE) 5 MG immediate release tablet Take 1 tablet (5 mg total) by mouth every 4 (four) hours as needed for severe pain. (Patient not taking: Reported on 07/10/2018) 10 tablet 0  . oxyCODONE-acetaminophen (PERCOCET) 5-325 MG tablet Take 1-2 tablets by mouth every 6 (six) hours as needed for severe pain. (Patient not taking: Reported on 07/10/2018) 30 tablet 0  . promethazine (PHENERGAN) 25 MG tablet Take 1 tablet (25 mg total) by mouth every 6 (six) hours as needed for nausea. (Patient not taking: Reported on 07/10/2018) 30 tablet 1  . senna-docusate (SENOKOT S) 8.6-50 MG tablet Take 1 tablet by mouth at bedtime as needed. (Patient not taking: Reported on 07/10/2018) 30 tablet 1  . VALIUM 10 MG tablet Take 10 mg by mouth daily.     No current facility-administered medications for this encounter.     REVIEW OF SYSTEMS:  On review of systems, the patient reports that he is doing well overall. He denies any chest pain, shortness of breath, cough, fevers, chills, night sweats, unintended weight changes. He denies any bowel disturbances, and denies abdominal pain, nausea or vomiting. He denies any new musculoskeletal or joint aches or pains. His IPSS was 12, indicating moderate urinary symptoms. His SHIM was 1, indicating he has severe erectile dysfunction. He notes he is not sexually active. A complete review of systems is obtained and is otherwise negative.    PHYSICAL EXAM:  Wt Readings from Last 3 Encounters:  03/18/19 190 lb (86.2 kg)  01/14/18 184 lb 15.5 oz (83.9 kg)  01/05/18 185 lb (83.9 kg)   Temp Readings from Last 3 Encounters:  01/14/18 98 F (36.7 C)  01/05/18 (!) 97.5 F (36.4 C) (Oral)  01/03/18 98.1 F (36.7 C) (Oral)   BP Readings from Last 3 Encounters:  01/14/18 (!) 161/85  01/05/18 (!) 156/87  01/03/18 126/74   Pulse Readings from Last 3 Encounters:  01/14/18 79  01/05/18 65  01/03/18 60   Pain Assessment Pain Score: 0-No pain/10  Physical exam not performed in light of telephone consult visit format.   KPS = 90  100 - Normal; no complaints; no evidence of disease. 90   - Able to carry on normal activity; minor signs or symptoms of disease. 80   - Normal activity with effort; some signs or symptoms of disease. 97   - Cares for self; unable to carry on normal activity or to do active work. 60   - Requires occasional assistance, but is able to care for most of his personal needs. 50   - Requires considerable assistance and frequent medical care. 74   - Disabled; requires special care and assistance. 48   - Severely disabled; hospital admission is indicated although death not imminent. 67   - Very sick; hospital admission necessary; active supportive treatment necessary. 10   - Moribund; fatal processes progressing rapidly. 0     -  Dead  Karnofsky DA, Abelmann [place] Johns, Craver LS and Burchenal [facility] [phone]) The use of the nitrogen mustards in the palliative treatment of carcinoma: with particular reference to bronchogenic carcinoma Cancer 1 634-56  LABORATORY DATA:  Lab Results  Component Value Date   WBC 9.7 01/05/2018   HGB 14.3 01/05/2018   HCT 46.9 01/05/2018   MCV 97.3 01/05/2018   PLT 273 01/05/2018   Lab Results  Component Value Date   NA 138 01/05/2018   K 4.2 01/05/2018   CL 105 01/05/2018   CO2 22 01/05/2018   No results found for: ALT, AST, GGT, ALKPHOS, BILITOT   RADIOGRAPHY: No results found.    IMPRESSION/PLAN: This visit was conducted via Telephone to spare the patient unnecessary potential exposure in the healthcare setting during the current COVID-19 pandemic. 1. 74 y.o. gentleman with Stage T1c adenocarcinoma of the prostate with Gleason Score of 3+4, and PSA of 21.3 (42.6 adjusted for finasteride). We discussed the patient's workup and outlined the nature of prostate cancer in this setting. The patient's T stage, Gleason's score, and PSA put him into the intermediate-high risk group.  We did discuss that given his longstanding history of elevated and fluctuating PSA as well as BPH with BOO, we felt that it was highly likely that his PSA was more reflective of the significantly enlarged prostate as opposed to higher risk prostate cancer.  Accordingly, he is eligible for a variety of potential treatment options including brachytherapy, 5.5 weeks of external radiation or prostatectomy. We discussed the available radiation techniques, and focused on the details and logistics of delivery. The patient is not a candidate for brachytherapy with a prostate volume of 120 cc despite maximal medical therapy for many years. We discussed and outlined the risks, benefits, short and long-term effects associated with external beam radiotherapy and compared and contrasted these with prostatectomy. We discussed the role of  SpaceOAR gel in reducing the rectal toxicity associated with radiotherapy.  He was encouraged to ask questions that were answered to his stated satisfaction.  He appears to have a good understanding of his disease and our recommendations for treatment which are of curative intent.  At the end of the conversation, the patient appears to be leaning towards proceeding with 5-1/2 weeks of prostate IMRT but remains undecided and would like to take some additional time to consider his options.  He plans to reach a final decision within the next 1 to 2 weeks and will contact either our office or [facility] so that we can move forward with treatment planning accordingly. We will share our discussion with Dr. Louis Meckel and await the patient's final decision.   Given current concerns for patient exposure during the COVID-19 pandemic, this encounter was conducted via telephone. The patient was notified in advance and was offered a MyChart meeting to allow for face to face communication but unfortunately reported that he did not have the appropriate resources/technology to support such a visit and instead preferred to proceed with telephone consult. The patient has given verbal consent for this type of encounter. The time spent during this encounter was 75 minutes. The attendants for this meeting include Tyler Pita MD, Freeman Caldron PA-C, [place], patient, ROMON CORSINO and his wife, Jan. During the encounter, Tyler Pita MD, Freeman Caldron PA-C, and scribe, Wilburn Mylar were located at [place].  Patient, HAIDON CHIRIBOGA and wife Jan were located at home.    Nicholos Johns, PA-C  Tyler Pita, MD  [facility]  Radiation Oncology Direct Dial: [phone]  Fax: [phone] [email or web address]  Skype  LinkedIn  This document serves as a record of services personally performed by Tyler Pita, MD and Freeman Caldron, PA-C. It was created on their behalf by Wilburn Mylar, a trained medical scribe. The creation of this record is based on the scribe's personal observations and the provider's statements to them. This document has been checked and approved by the attending provider.

## 2019-03-18 NOTE — Progress Notes (Signed)
See progress note under physician encounter. 

## 2019-03-19 ENCOUNTER — Telehealth: Payer: Self-pay | Admitting: Radiation Oncology

## 2019-03-19 NOTE — Telephone Encounter (Signed)
His biopsy showed favorable intermediate risk disease but his PSA technically puts him into the high risk group although, as explained yesterday, we suspect that a large percentage of his PSA is secondary to his very large prostate and less attributable to his cancer.  We did advise that our goal with radiation treatment is to get his PSA low, likely 1 or less, and keep it there permanently to indicate successful treatment. -Dillian Feig

## 2019-03-19 NOTE — Telephone Encounter (Signed)
Received call from patient with questions reference consult yesterday. Patient questions if he is "in the low intermittent risk category" and if he heard right when Dr. Tammi Klippel said, "my psa will go down to 1 or less with treatment?" Patient understands this RN will consult with Dr. Tammi Klippel and Freeman Caldron, PA-C then, phone him back.

## 2019-03-20 ENCOUNTER — Encounter: Payer: Self-pay | Admitting: Radiation Oncology

## 2019-03-20 ENCOUNTER — Telehealth: Payer: Self-pay | Admitting: Medical Oncology

## 2019-03-20 NOTE — Progress Notes (Signed)
Cira Rue, RN confirmed she would phone the patient today with the following information on behalf of Ashlyn, PA-C and myself.   His biopsy showed favorable intermediate risk disease but his PSA technically puts him into the high risk group although, as explained yesterday, we suspect that a large percentage of his PSA is secondary to his very large prostate and less attributable to his cancer.  We did advise that our goal with radiation treatment is to get his PSA low, likely 1 or less, and keep it there permanently to indicate successful treatment.

## 2019-03-20 NOTE — Telephone Encounter (Signed)
Spoke with Adam Gonzales, to introduce myself as the prostate nurse navigator and discuss my role. He states his consult went well with Dr. Tammi Klippel and he thinks he has decided on external beam radiation. He has had anxiety trying to make a decision between surgery and radiation. He has not been sleeping well and his appetite is not good. He feels like he has no one to really talk to. He has reached out to a couple of men that had radiation and was told they had terrible experiences. I discussed our prostate support group, and invited him to join [place] on Monday 2/15. He was very appreciative of the invite and is looking forward to the group. We discussed surgery and radiation and he feels he will experience less side effects from the radiation. We discuss the side effects of radiation and post radiation follow up. I gave him my contact information and encouraged him to call me with questions or concerns. He voiced understanding.

## 2019-03-21 ENCOUNTER — Telehealth: Payer: Self-pay | Admitting: Medical Oncology

## 2019-03-21 ENCOUNTER — Telehealth: Payer: Self-pay | Admitting: *Deleted

## 2019-03-21 NOTE — Telephone Encounter (Signed)
Patient called and left a message, he has decided to move forward with 5.5 weeks of radiation. I will forward information to [place].

## 2019-03-21 NOTE — Telephone Encounter (Signed)
Called patient to inform that I received his message to confirm that he wants to have RT, lvm

## 2019-03-24 ENCOUNTER — Telehealth: Payer: Self-pay | Admitting: Medical Oncology

## 2019-03-24 ENCOUNTER — Telehealth: Payer: Self-pay | Admitting: *Deleted

## 2019-03-24 NOTE — Telephone Encounter (Signed)
RETURNED PATIENT'S PHONE CALL, SPOKE WITH PATIENT. ?

## 2019-03-24 NOTE — Telephone Encounter (Signed)
Patient called asking about the zoom invite for the prostate support group. We discussed how to join and I look forward to seeing him tonight.

## 2019-03-25 ENCOUNTER — Telehealth: Payer: Self-pay | Admitting: *Deleted

## 2019-03-25 NOTE — Telephone Encounter (Signed)
Returned patient's phone call, spoke with patient 

## 2019-03-26 ENCOUNTER — Telehealth: Payer: Self-pay | Admitting: *Deleted

## 2019-03-26 ENCOUNTER — Other Ambulatory Visit: Payer: Self-pay | Admitting: Urology

## 2019-03-26 DIAGNOSIS — C61 Malignant neoplasm of prostate: Secondary | ICD-10-CM

## 2019-03-26 NOTE — Telephone Encounter (Signed)
CALLED PATIENT TO INFORM OF SIM APPT. BEING MOVED TO 05-13-19- ARRIVAL TIME- 8:15 AM @ [place], SPOKE WITH PATIENT AND HE IS AWARE OF THIS APPT.

## 2019-03-29 ENCOUNTER — Ambulatory Visit: Payer: Medicare Other | Attending: Internal Medicine

## 2019-03-29 DIAGNOSIS — Z23 Encounter for immunization: Secondary | ICD-10-CM | POA: Insufficient documentation

## 2019-03-29 NOTE — Progress Notes (Signed)
   Covid-19 Vaccination Clinic  Name:  Adam Gonzales    MRN: [id number] DOB: 1945/06/02  03/29/2019  Adam Gonzales was observed post Covid-19 immunization for 15 minutes without incidence. He was provided with Vaccine Information Sheet and instruction to access the V-Safe system.   Adam Gonzales was instructed to call 911 with any severe reactions post vaccine: Marland Kitchen Difficulty breathing  . Swelling of your face and throat  . A fast heartbeat  . A bad rash all over your body  . Dizziness and weakness    Immunizations Administered    Name Date Dose VIS Date Route   Pfizer COVID-19 Vaccine 03/29/2019 11:31 AM 0.3 mL 01/17/2019 Intramuscular   Manufacturer: [employer]   Lot: [id number]   [place]: [id number]

## 2019-03-31 ENCOUNTER — Telehealth: Payer: Self-pay | Admitting: Medical Oncology

## 2019-03-31 NOTE — Telephone Encounter (Signed)
Patient called asking if we are going to have support group tonight. The support group was scheduled for last Monday, but due to technical difficulties, it was not held. The group had hoped to meet tonight. I do not know but will follow up with [name], and call him back. He voiced understanding.

## 2019-03-31 NOTE — Telephone Encounter (Signed)
I called Adam Gonzales to let him know the support group will not meet tonight. [name], will send out an e-mail to the participants to let them know we will resume in March. [name] is interested in talking with someone who has completed radiation treatments. I will reach out to some former patients and have them contact Adam Gonzales. He was very appreciative and voiced understanding.

## 2019-04-03 ENCOUNTER — Encounter: Payer: Self-pay | Admitting: Radiation Oncology

## 2019-04-11 ENCOUNTER — Telehealth: Payer: Self-pay | Admitting: Medical Oncology

## 2019-04-11 NOTE — Telephone Encounter (Signed)
Patient called asking if I was able to locate a mentor. He has not heard from anyone. I will reach out to another patient and asked them to call him. He was very Patent attorney. I reminded him of prostate support group 3/15. He plans to attend.

## 2019-04-14 ENCOUNTER — Other Ambulatory Visit: Payer: Self-pay

## 2019-04-14 ENCOUNTER — Encounter: Payer: Self-pay | Admitting: Family Medicine

## 2019-04-14 ENCOUNTER — Ambulatory Visit (INDEPENDENT_AMBULATORY_CARE_PROVIDER_SITE_OTHER): Payer: Medicare Other | Admitting: Family Medicine

## 2019-04-14 VITALS — BP 144/76 | HR 66 | Temp 98.0°F | Ht 69.5 in | Wt 189.2 lb

## 2019-04-14 DIAGNOSIS — I10 Essential (primary) hypertension: Secondary | ICD-10-CM

## 2019-04-14 DIAGNOSIS — C61 Malignant neoplasm of prostate: Secondary | ICD-10-CM

## 2019-04-14 DIAGNOSIS — N182 Chronic kidney disease, stage 2 (mild): Secondary | ICD-10-CM | POA: Diagnosis not present

## 2019-04-14 DIAGNOSIS — H6980 Other specified disorders of Eustachian tube, unspecified ear: Secondary | ICD-10-CM

## 2019-04-14 DIAGNOSIS — N529 Male erectile dysfunction, unspecified: Secondary | ICD-10-CM | POA: Diagnosis not present

## 2019-04-14 NOTE — Progress Notes (Signed)
Adam Gonzales is a 74 y.o. male who presents today for an office visit.  Assessment/Plan:  New/Acute Problems: Eustachian tube dysfunction No red flags.  Recommended Flonase daily for the next couple of weeks.  If no improvement will need to see ENT.   Chronic Problems Addressed Today: Malignant neoplasm of prostate [facility]) Continue management per urology and oncology.  HTN (hypertension) Home blood pressure is typically in the 120s over 50s.  We will stop spironolactone today.  Continue lisinopril 40 mg daily and HCTZ 12.5 mg daily.  Advised him to continue checking at home with goal of 150/90 or lower.  He will follow-up in a few months for CPE.  CKD (chronic kidney disease) stage 2, GFR 60-89 ml/min Check metabolic panel with next blood draw.  Encouraged good oral hydration.    Subjective:  HPI:  Patient is here today as a new patient.  He is establishing care.  Chronic medical conditions include prostate cancer which is currently being managed by oncology and urology.  He also has a history of essential hypertension.  His chronic conditions are outlined below.  He has had ongoing issues with intermittent eustachian tube dysfunction.  He has seen ear nose and throat in the past who have removed cerumen.  He has noticed that his hearing improves when he accidentally pops open his eustachian tube after yawning.  Symptoms predominantly located in the right side.  His stable, chronic medical conditions are outlined below:   Essential Hypertension - On HCTZ 12.5mg  daily, lisinopril 40mg  daily, and spironolactone 25 mg daily  Prostate Cancer / BPH - Follows with oncology and urology -On finasteride 5 mg daily and Flomax 0.4 mg daily.  PMH:  The following were reviewed and entered/updated in epic: Past Medical History:  Diagnosis Date  . 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 Gout   . Hypertension   . Prostate cancer ([place])   . Quadriceps tendon rupture, right, initial  encounter    Patient Active Problem List   Diagnosis Date Noted  . Malignant neoplasm of prostate ([place]) 03/18/2019  . Rupture of right quadriceps tendon 01/14/2018  . 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 HTN (hypertension) 01/02/2018  . Acute monoarthritis 01/01/2018  . 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 CYST EXCISION    . HYDROCELE EXCISION Right   . PROSTATE BIOPSY    . QUADRICEPS TENDON REPAIR Right 01/14/2018   Procedure: REPAIR RIGHT QUADRICEPS TENDON;  Surgeon: Leandrew Koyanagi, MD;  Location: [facility];  Service: Orthopedics;  Laterality: Right;    Family History  Problem Relation Age of Onset  . Heart failure Mother   . Cancer - Colon Father   . Dementia Sister   . Throat cancer Brother   . Pancreatic cancer Neg Hx   . Breast cancer Neg Hx   . Prostate cancer Neg Hx     Medications- reviewed and updated Current Outpatient Medications  Medication Sig Dispense Refill  . aspirin EC 81 MG tablet Take 81 mg by mouth daily.    . finasteride (PROSCAR) 5 MG tablet Take 5 mg by mouth daily.  10  . hydrochlorothiazide (HYDRODIURIL) 12.5 MG tablet Take 12.5 mg by mouth daily.    Marland Kitchen lisinopril (PRINIVIL,ZESTRIL) 40 MG tablet Take 40 mg by mouth daily.  2  . meloxicam (MOBIC) 15 MG tablet Take 15 mg by mouth daily.    Marland Kitchen spironolactone (  ALDACTONE) 25 MG tablet Take 25 mg by mouth daily.    . Tamsulosin HCl (FLOMAX) 0.4 MG CAPS Take 0.4 mg by mouth daily.      No current facility-administered medications for this visit.    Allergies-reviewed and updated No Known Allergies  Social History   Socioeconomic History  . Marital status: Married    Spouse name: Not on file  . Number of children: Not on file  . Years of education: Not on file  . Highest education level: Not on file  Occupational History  . Not on file  Tobacco Use  . Smoking status: Never Smoker  . Smokeless  tobacco: Never Used  Substance and Sexual Activity  . Alcohol use: Yes    Comment: social  . Drug use: No  . Sexual activity: Not Currently  Other Topics Concern  . Not on file  Social History Narrative  . Not on file   Social Determinants of Health   Financial Resource Strain:   . Difficulty of Paying Living Expenses: Not on file  Food Insecurity:   . Worried About Charity fundraiser in the Last Year: Not on file  . Ran Out of Food in the Last Year: Not on file  Transportation Needs:   . Lack of Transportation (Medical): Not on file  . Lack of Transportation (Non-Medical): Not on file  Physical Activity:   . Days of Exercise per Week: Not on file  . Minutes of Exercise per Session: Not on file  Stress:   . Feeling of Stress : Not on file  Social Connections:   . Frequency of Communication with Friends and Family: Not on file  . Frequency of Social Gatherings with Friends and Family: Not on file  . Attends Religious Services: Not on file  . Active Member of Clubs or Organizations: Not on file  . Attends Archivist Meetings: Not on file  . Marital Status: Not on file          Objective:  Physical Exam: BP (!) 144/76   Pulse 66   Temp 98 F (36.7 C)   Ht 5' 9.5" (1.765 m)   Wt 189 lb 3.2 oz (85.8 kg)   SpO2 96%   BMI 27.54 kg/m   Gen: No acute distress, resting comfortably CV: Regular rate and rhythm with no murmurs appreciated Pulm: Normal work of breathing, clear to auscultation bilaterally with no crackles, wheezes, or rhonchi Neuro: Grossly normal, moves all extremities Psych: Normal affect and thought content  Time Spent: 66 minutes of total time was spent on the date of the encounter performing the following actions: chart review prior to seeing the patient, obtaining history, performing a medically necessary exam, counseling on the treatment plan, placing orders, and documenting in our EHR.        Algis Greenhouse. [name], MD 04/14/2019 3:38 PM

## 2019-04-14 NOTE — Assessment & Plan Note (Signed)
Check metabolic panel with next blood draw.  Encouraged good oral hydration.

## 2019-04-14 NOTE — Assessment & Plan Note (Signed)
Home blood pressure is typically in the 120s over 50s.  We will stop spironolactone today.  Continue lisinopril 40 mg daily and HCTZ 12.5 mg daily.  Advised him to continue checking at home with goal of 150/90 or lower.  He will follow-up in a few months for CPE.

## 2019-04-14 NOTE — Patient Instructions (Signed)
It was very nice to see you today!  Please stop spironolactone.  Keep an eye on your blood pressure and let me know if persistently 150/90 or higher.  Please try using Flonase daily for the next few weeks.  Come back to see me in 3 to 6 months for your annual physical blood work, or sooner if needed.  Take care, Dr [name]  Please try these tips to maintain a healthy lifestyle:   Eat at least 3 REAL meals and 1-2 snacks per day.  Aim for no more than 5 hours between eating.  If you eat breakfast, please do so within one hour of getting up.    Each meal should contain half fruits/vegetables, one quarter protein, and one quarter carbs (no bigger than a computer mouse)   Cut down on sweet beverages. This includes juice, soda, and sweet tea.     Drink at least 1 glass of water with each meal and aim for at least 8 glasses per day   Exercise at least 150 minutes every week.

## 2019-04-14 NOTE — Assessment & Plan Note (Signed)
Continue management per urology and oncology. 

## 2019-04-21 ENCOUNTER — Telehealth: Payer: Self-pay | Admitting: Medical Oncology

## 2019-04-21 NOTE — Telephone Encounter (Signed)
Patient called to confirm prostate support group for this evening. He also wanted to thank me for the mentor I set up with him. He learned a lot and it helped ease his fears.

## 2019-04-22 ENCOUNTER — Telehealth: Payer: Self-pay | Admitting: Radiation Oncology

## 2019-04-22 ENCOUNTER — Ambulatory Visit: Payer: Medicare Other | Attending: Internal Medicine

## 2019-04-22 DIAGNOSIS — Z23 Encounter for immunization: Secondary | ICD-10-CM

## 2019-04-22 NOTE — Progress Notes (Signed)
   Covid-19 Vaccination Clinic  Name:  Adam Gonzales    MRN: [id number] DOB: [birth year]  04/22/2019  [name] was observed post Covid-19 immunization for 15 minutes without incident. He was provided with Vaccine Information Sheet and instruction to access the V-Safe system.   [name] was instructed to call 911 with any severe reactions post vaccine: Marland Kitchen Difficulty breathing  . Swelling of face and throat  . A fast heartbeat  . A bad rash all over body  . Dizziness and weakness   Immunizations Administered    Name Date Dose VIS Date Route   Pfizer COVID-19 Vaccine 04/22/2019  2:30 PM 0.3 mL 01/17/2019 Intramuscular   Manufacturer: [employer]   Lot: [id number]   [place]: [id number]

## 2019-04-22 NOTE — Telephone Encounter (Signed)
Received voicemail message from patient questioning if Dr. Tammi Klippel does IMRT. Phoned patient back promptly and explained he does. Also, read the following line to the patient from Dr. Johny Shears consult note with him: At the end of the conversation, the patient appears to be leaning towards proceeding with 5-1/2 weeks of prostate IMRT but remains undecided and would like to take some additional time to consider his options. Patient verbalized understanding. Also, patient reported he participated in his first support group yesterday and it was a big help.

## 2019-04-23 ENCOUNTER — Telehealth: Payer: Self-pay | Admitting: Radiation Oncology

## 2019-04-23 NOTE — Telephone Encounter (Signed)
Received voicemail message from patient requesting a return call. Phoned patient back several times before connecting. Patient verbalized his desire to arrange his daily treatment appointments for a time after 3 pm each day "so he can keep his job." Explained that shouldn't be any problem at all as the most desired appointments are typically first thing in the morning. Explained he will discuss and receive an appointment calendar following his simulation on 05/10/2019. Also, explained need for MRI that same day at 1200. Patient verbalized understanding of all reviewed and appreciation for the return call.

## 2019-04-24 ENCOUNTER — Encounter: Payer: Self-pay | Admitting: Radiation Oncology

## 2019-04-30 ENCOUNTER — Telehealth: Payer: Self-pay | Admitting: Family Medicine

## 2019-04-30 ENCOUNTER — Telehealth: Payer: Self-pay | Admitting: Radiation Oncology

## 2019-04-30 NOTE — Telephone Encounter (Signed)
Received voicemail message from patient requesting return call. Phoned patient back. Patient question when he can return to work following his fiducial marker and spaceoar placement tomorrow. Explained that typically individuals return the very next day if they are feeling ok but ultimately the decision is up to his urologist. Patient verbalized understanding and expressed appreciation for the return call.

## 2019-04-30 NOTE — Telephone Encounter (Signed)
ROI faxed to [facility] 3/24/21fbg

## 2019-05-01 DIAGNOSIS — C61 Malignant neoplasm of prostate: Secondary | ICD-10-CM | POA: Diagnosis not present

## 2019-05-06 ENCOUNTER — Ambulatory Visit: Payer: Medicare Other | Admitting: Radiation Oncology

## 2019-05-07 NOTE — Telephone Encounter (Signed)
disregard

## 2019-05-12 ENCOUNTER — Telehealth: Payer: Self-pay | Admitting: Radiation Oncology

## 2019-05-12 ENCOUNTER — Telehealth: Payer: Self-pay | Admitting: *Deleted

## 2019-05-12 NOTE — Telephone Encounter (Signed)
Received four voicemails this morning from the patient in close succession. Patient scheduled for CT/simulation tomorrow followed by MRI to confirm SpaceOar placement. Logistics of this day have been discussed with the patient several times. Returned patients call. Patient wished to review logistics of tomorrow's appointments again. I did so and the patient verbalized understanding. Sensitive to his anxiety I reinforced that if he left me a message it would always be returned. Explained that leaving four messages concerning the same thing would delay my ability to return his call. Patient verbalized understanding.

## 2019-05-12 NOTE — Telephone Encounter (Signed)
CALLED PATIENT TO REMIND OF SIM AND MRI APPT. FOR 05-13-19, SPOKE WITH PATIENT AND HE IS AWARE OF THESE APPTS.

## 2019-05-13 ENCOUNTER — Other Ambulatory Visit: Payer: Self-pay

## 2019-05-13 ENCOUNTER — Ambulatory Visit
Admission: RE | Admit: 2019-05-13 | Discharge: 2019-05-13 | Disposition: A | Discharge status: Home / Self Care | Payer: Medicare Other | Source: Ambulatory Visit | Attending: Radiation Oncology | Admitting: Radiation Oncology

## 2019-05-13 ENCOUNTER — Ambulatory Visit (HOSPITAL_COMMUNITY)
Admission: RE | Admit: 2019-05-13 | Discharge: 2019-05-13 | Disposition: A | Discharge status: Home / Self Care | Payer: Medicare Other | Source: Ambulatory Visit | Attending: Urology | Admitting: Urology

## 2019-05-13 ENCOUNTER — Ambulatory Visit: Payer: Medicare Other | Admitting: Radiation Oncology

## 2019-05-13 DIAGNOSIS — C61 Malignant neoplasm of prostate: Secondary | ICD-10-CM | POA: Insufficient documentation

## 2019-05-13 DIAGNOSIS — Z510 Encounter for antineoplastic radiation therapy: Secondary | ICD-10-CM | POA: Diagnosis not present

## 2019-05-13 NOTE — Progress Notes (Signed)
  Radiation Oncology         [phone] ________________________________  Name: SYNCERE KATZENSTEIN MRN: [id number]  Date: 05/13/2019  DOB: [birth year]  SIMULATION AND TREATMENT PLANNING NOTE    ICD-10-CM   1. Malignant neoplasm of prostate ([place])  C61     DIAGNOSIS:  74 y.o. gentleman with Stage T1c adenocarcinoma of the prostate with Gleason score of 3+4, and PSA of 21.3 (42.6 adjusted for finasteride).  NARRATIVE:  The patient was brought to the [facility].  Identity was confirmed.  All relevant records and images related to the planned course of therapy were reviewed.  The patient freely provided informed written consent to proceed with treatment after reviewing the details related to the planned course of therapy. The consent form was witnessed and verified by the simulation staff.  Then, the patient was set-up in a stable reproducible supine position for radiation therapy.  A vacuum lock pillow device was custom fabricated to position his legs in a reproducible immobilized position.  Then, I performed a urethrogram under sterile conditions to identify the prostatic apex.  CT images were obtained.  Surface markings were placed.  The CT images were loaded into the planning software.  Then the prostate target and avoidance structures including the rectum, bladder, bowel and hips were contoured.  Treatment planning then occurred.  The radiation prescription was entered and confirmed.  A total of one complex treatment devices was fabricated. I have requested : Intensity Modulated Radiotherapy (IMRT) is medically necessary for this case for the following reason:  Rectal sparing.Marland Kitchen  PLAN:  The patient will receive 70 Gy in 28 fractions.  The patient and I discussed the radiation plan during his consult visits.  While his markedly elevated PSA places him in the high risk group and could be treated with a more prolonged course including lymph node irradiation, he elected for this shorter  course of 28 treatments covering the prostate only, after discussing the pros and cons of each approach.  In part, because the patient's PSA has fluctuated significantly and his prostate is markedly enlarged at 128 cc.  It was felt that some component of his markedly elevated PSA was from prostatomegaly and prostatitis moreso than prostate tumor burden.  ________________________________  Sheral Apley. Tammi Klippel, M.D.

## 2019-05-16 ENCOUNTER — Encounter: Payer: Self-pay | Admitting: Medical Oncology

## 2019-05-16 NOTE — Progress Notes (Signed)
Patient called and left a message asking about a book that discusses signs and symptoms of prostate radiation. I explained, he will have a teaching session with Sam, RN after her starts radiation and he will receive book at that time. He has questions regarding his diet. I a handout that I will give him when he starts radiation 4/15.

## 2019-05-19 ENCOUNTER — Encounter: Payer: Self-pay | Admitting: Medical Oncology

## 2019-05-20 DIAGNOSIS — Z510 Encounter for antineoplastic radiation therapy: Secondary | ICD-10-CM | POA: Diagnosis not present

## 2019-05-20 DIAGNOSIS — C61 Malignant neoplasm of prostate: Secondary | ICD-10-CM | POA: Diagnosis not present

## 2019-05-22 ENCOUNTER — Other Ambulatory Visit: Payer: Self-pay

## 2019-05-22 ENCOUNTER — Encounter: Payer: Self-pay | Admitting: Medical Oncology

## 2019-05-22 ENCOUNTER — Ambulatory Visit
Admission: RE | Admit: 2019-05-22 | Discharge: 2019-05-22 | Disposition: A | Discharge status: Home / Self Care | Payer: Medicare Other | Source: Ambulatory Visit | Attending: Radiation Oncology | Admitting: Radiation Oncology

## 2019-05-22 DIAGNOSIS — Z510 Encounter for antineoplastic radiation therapy: Secondary | ICD-10-CM | POA: Diagnosis not present

## 2019-05-22 DIAGNOSIS — C61 Malignant neoplasm of prostate: Secondary | ICD-10-CM | POA: Diagnosis not present

## 2019-05-23 ENCOUNTER — Encounter: Payer: Self-pay | Admitting: Medical Oncology

## 2019-05-23 ENCOUNTER — Ambulatory Visit
Admission: RE | Admit: 2019-05-23 | Discharge: 2019-05-23 | Disposition: A | Discharge status: Home / Self Care | Payer: Medicare Other | Source: Ambulatory Visit | Attending: Radiation Oncology | Admitting: Radiation Oncology

## 2019-05-23 DIAGNOSIS — C61 Malignant neoplasm of prostate: Secondary | ICD-10-CM | POA: Diagnosis not present

## 2019-05-23 DIAGNOSIS — Z510 Encounter for antineoplastic radiation therapy: Secondary | ICD-10-CM | POA: Diagnosis not present

## 2019-05-23 NOTE — Progress Notes (Signed)
Patient has requested information about diet for prostate cancer. Handout provided by Dory Peru and discussed with patient.

## 2019-05-26 ENCOUNTER — Other Ambulatory Visit: Payer: Self-pay

## 2019-05-26 ENCOUNTER — Ambulatory Visit
Admission: RE | Admit: 2019-05-26 | Discharge: 2019-05-26 | Disposition: A | Discharge status: Home / Self Care | Payer: Medicare Other | Source: Ambulatory Visit | Attending: Radiation Oncology | Admitting: Radiation Oncology

## 2019-05-26 DIAGNOSIS — Z510 Encounter for antineoplastic radiation therapy: Secondary | ICD-10-CM | POA: Diagnosis not present

## 2019-05-26 DIAGNOSIS — C61 Malignant neoplasm of prostate: Secondary | ICD-10-CM | POA: Diagnosis not present

## 2019-05-27 ENCOUNTER — Other Ambulatory Visit: Payer: Self-pay

## 2019-05-27 ENCOUNTER — Ambulatory Visit
Admission: RE | Admit: 2019-05-27 | Discharge: 2019-05-27 | Disposition: A | Discharge status: Home / Self Care | Payer: Medicare Other | Source: Ambulatory Visit | Attending: Radiation Oncology | Admitting: Radiation Oncology

## 2019-05-27 DIAGNOSIS — Z510 Encounter for antineoplastic radiation therapy: Secondary | ICD-10-CM | POA: Diagnosis not present

## 2019-05-27 DIAGNOSIS — C61 Malignant neoplasm of prostate: Secondary | ICD-10-CM | POA: Diagnosis not present

## 2019-05-28 ENCOUNTER — Other Ambulatory Visit: Payer: Self-pay

## 2019-05-28 ENCOUNTER — Ambulatory Visit
Admission: RE | Admit: 2019-05-28 | Discharge: 2019-05-28 | Disposition: A | Discharge status: Home / Self Care | Payer: Medicare Other | Source: Ambulatory Visit | Attending: Radiation Oncology | Admitting: Radiation Oncology

## 2019-05-28 DIAGNOSIS — Z510 Encounter for antineoplastic radiation therapy: Secondary | ICD-10-CM | POA: Diagnosis not present

## 2019-05-28 DIAGNOSIS — C61 Malignant neoplasm of prostate: Secondary | ICD-10-CM | POA: Diagnosis not present

## 2019-05-29 ENCOUNTER — Other Ambulatory Visit: Payer: Self-pay

## 2019-05-29 ENCOUNTER — Ambulatory Visit
Admission: RE | Admit: 2019-05-29 | Discharge: 2019-05-29 | Disposition: A | Discharge status: Home / Self Care | Payer: Medicare Other | Source: Ambulatory Visit | Attending: Radiation Oncology | Admitting: Radiation Oncology

## 2019-05-29 DIAGNOSIS — C61 Malignant neoplasm of prostate: Secondary | ICD-10-CM | POA: Diagnosis not present

## 2019-05-29 DIAGNOSIS — Z510 Encounter for antineoplastic radiation therapy: Secondary | ICD-10-CM | POA: Diagnosis not present

## 2019-05-30 ENCOUNTER — Ambulatory Visit
Admission: RE | Admit: 2019-05-30 | Discharge: 2019-05-30 | Disposition: A | Discharge status: Home / Self Care | Payer: Medicare Other | Source: Ambulatory Visit | Attending: Radiation Oncology | Admitting: Radiation Oncology

## 2019-05-30 ENCOUNTER — Other Ambulatory Visit: Payer: Self-pay

## 2019-05-30 DIAGNOSIS — Z510 Encounter for antineoplastic radiation therapy: Secondary | ICD-10-CM | POA: Diagnosis not present

## 2019-05-30 DIAGNOSIS — C61 Malignant neoplasm of prostate: Secondary | ICD-10-CM | POA: Diagnosis not present

## 2019-06-02 ENCOUNTER — Ambulatory Visit
Admission: RE | Admit: 2019-06-02 | Discharge: 2019-06-02 | Disposition: A | Discharge status: Home / Self Care | Payer: Medicare Other | Source: Ambulatory Visit | Attending: Radiation Oncology | Admitting: Radiation Oncology

## 2019-06-02 ENCOUNTER — Other Ambulatory Visit: Payer: Self-pay

## 2019-06-02 DIAGNOSIS — Z510 Encounter for antineoplastic radiation therapy: Secondary | ICD-10-CM | POA: Diagnosis not present

## 2019-06-02 DIAGNOSIS — C61 Malignant neoplasm of prostate: Secondary | ICD-10-CM | POA: Diagnosis not present

## 2019-06-03 ENCOUNTER — Ambulatory Visit
Admission: RE | Admit: 2019-06-03 | Discharge: 2019-06-03 | Disposition: A | Discharge status: Home / Self Care | Payer: Medicare Other | Source: Ambulatory Visit | Attending: Radiation Oncology | Admitting: Radiation Oncology

## 2019-06-03 ENCOUNTER — Other Ambulatory Visit: Payer: Self-pay

## 2019-06-03 DIAGNOSIS — C61 Malignant neoplasm of prostate: Secondary | ICD-10-CM | POA: Diagnosis not present

## 2019-06-03 DIAGNOSIS — Z510 Encounter for antineoplastic radiation therapy: Secondary | ICD-10-CM | POA: Diagnosis not present

## 2019-06-04 ENCOUNTER — Ambulatory Visit
Admission: RE | Admit: 2019-06-04 | Discharge: 2019-06-04 | Disposition: A | Discharge status: Home / Self Care | Payer: Medicare Other | Source: Ambulatory Visit | Attending: Radiation Oncology | Admitting: Radiation Oncology

## 2019-06-04 ENCOUNTER — Other Ambulatory Visit: Payer: Self-pay

## 2019-06-04 DIAGNOSIS — Z510 Encounter for antineoplastic radiation therapy: Secondary | ICD-10-CM | POA: Diagnosis not present

## 2019-06-04 DIAGNOSIS — C61 Malignant neoplasm of prostate: Secondary | ICD-10-CM | POA: Diagnosis not present

## 2019-06-05 ENCOUNTER — Other Ambulatory Visit: Payer: Self-pay

## 2019-06-05 ENCOUNTER — Ambulatory Visit
Admission: RE | Admit: 2019-06-05 | Discharge: 2019-06-05 | Disposition: A | Discharge status: Home / Self Care | Payer: Medicare Other | Source: Ambulatory Visit | Attending: Radiation Oncology | Admitting: Radiation Oncology

## 2019-06-05 DIAGNOSIS — Z510 Encounter for antineoplastic radiation therapy: Secondary | ICD-10-CM | POA: Diagnosis not present

## 2019-06-05 DIAGNOSIS — C61 Malignant neoplasm of prostate: Secondary | ICD-10-CM | POA: Diagnosis not present

## 2019-06-06 ENCOUNTER — Other Ambulatory Visit: Payer: Self-pay

## 2019-06-06 ENCOUNTER — Ambulatory Visit
Admission: RE | Admit: 2019-06-06 | Discharge: 2019-06-06 | Disposition: A | Discharge status: Home / Self Care | Payer: Medicare Other | Source: Ambulatory Visit | Attending: Radiation Oncology | Admitting: Radiation Oncology

## 2019-06-06 DIAGNOSIS — Z510 Encounter for antineoplastic radiation therapy: Secondary | ICD-10-CM | POA: Diagnosis not present

## 2019-06-06 DIAGNOSIS — C61 Malignant neoplasm of prostate: Secondary | ICD-10-CM | POA: Diagnosis not present

## 2019-06-09 ENCOUNTER — Ambulatory Visit
Admission: RE | Admit: 2019-06-09 | Discharge: 2019-06-09 | Disposition: A | Discharge status: Home / Self Care | Payer: Medicare Other | Source: Ambulatory Visit | Attending: Radiation Oncology | Admitting: Radiation Oncology

## 2019-06-09 ENCOUNTER — Other Ambulatory Visit: Payer: Self-pay

## 2019-06-09 DIAGNOSIS — C61 Malignant neoplasm of prostate: Secondary | ICD-10-CM | POA: Diagnosis not present

## 2019-06-09 DIAGNOSIS — Z510 Encounter for antineoplastic radiation therapy: Secondary | ICD-10-CM | POA: Insufficient documentation

## 2019-06-10 ENCOUNTER — Other Ambulatory Visit: Payer: Self-pay

## 2019-06-10 ENCOUNTER — Ambulatory Visit
Admission: RE | Admit: 2019-06-10 | Discharge: 2019-06-10 | Disposition: A | Discharge status: Home / Self Care | Payer: Medicare Other | Source: Ambulatory Visit | Attending: Radiation Oncology | Admitting: Radiation Oncology

## 2019-06-10 DIAGNOSIS — C61 Malignant neoplasm of prostate: Secondary | ICD-10-CM | POA: Diagnosis not present

## 2019-06-10 DIAGNOSIS — Z510 Encounter for antineoplastic radiation therapy: Secondary | ICD-10-CM | POA: Diagnosis not present

## 2019-06-11 ENCOUNTER — Ambulatory Visit
Admission: RE | Admit: 2019-06-11 | Discharge: 2019-06-11 | Disposition: A | Discharge status: Home / Self Care | Payer: Medicare Other | Source: Ambulatory Visit | Attending: Radiation Oncology | Admitting: Radiation Oncology

## 2019-06-11 ENCOUNTER — Other Ambulatory Visit: Payer: Self-pay

## 2019-06-11 DIAGNOSIS — Z510 Encounter for antineoplastic radiation therapy: Secondary | ICD-10-CM | POA: Diagnosis not present

## 2019-06-11 DIAGNOSIS — C61 Malignant neoplasm of prostate: Secondary | ICD-10-CM | POA: Diagnosis not present

## 2019-06-12 ENCOUNTER — Other Ambulatory Visit: Payer: Self-pay

## 2019-06-12 ENCOUNTER — Ambulatory Visit
Admission: RE | Admit: 2019-06-12 | Discharge: 2019-06-12 | Disposition: A | Discharge status: Home / Self Care | Payer: Medicare Other | Source: Ambulatory Visit | Attending: Radiation Oncology | Admitting: Radiation Oncology

## 2019-06-12 DIAGNOSIS — C61 Malignant neoplasm of prostate: Secondary | ICD-10-CM | POA: Diagnosis not present

## 2019-06-12 DIAGNOSIS — Z510 Encounter for antineoplastic radiation therapy: Secondary | ICD-10-CM | POA: Diagnosis not present

## 2019-06-13 ENCOUNTER — Ambulatory Visit
Admission: RE | Admit: 2019-06-13 | Discharge: 2019-06-13 | Disposition: A | Discharge status: Home / Self Care | Payer: Medicare Other | Source: Ambulatory Visit | Attending: Radiation Oncology | Admitting: Radiation Oncology

## 2019-06-13 ENCOUNTER — Other Ambulatory Visit: Payer: Self-pay

## 2019-06-13 DIAGNOSIS — C61 Malignant neoplasm of prostate: Secondary | ICD-10-CM | POA: Diagnosis not present

## 2019-06-13 DIAGNOSIS — Z510 Encounter for antineoplastic radiation therapy: Secondary | ICD-10-CM | POA: Diagnosis not present

## 2019-06-16 ENCOUNTER — Other Ambulatory Visit: Payer: Self-pay

## 2019-06-16 ENCOUNTER — Ambulatory Visit
Admission: RE | Admit: 2019-06-16 | Discharge: 2019-06-16 | Disposition: A | Discharge status: Home / Self Care | Payer: Medicare Other | Source: Ambulatory Visit | Attending: Radiation Oncology | Admitting: Radiation Oncology

## 2019-06-16 DIAGNOSIS — Z510 Encounter for antineoplastic radiation therapy: Secondary | ICD-10-CM | POA: Diagnosis not present

## 2019-06-16 DIAGNOSIS — C61 Malignant neoplasm of prostate: Secondary | ICD-10-CM | POA: Diagnosis not present

## 2019-06-17 ENCOUNTER — Ambulatory Visit
Admission: RE | Admit: 2019-06-17 | Discharge: 2019-06-17 | Disposition: A | Discharge status: Home / Self Care | Payer: Medicare Other | Source: Ambulatory Visit | Attending: Radiation Oncology | Admitting: Radiation Oncology

## 2019-06-17 ENCOUNTER — Other Ambulatory Visit: Payer: Self-pay

## 2019-06-17 DIAGNOSIS — Z510 Encounter for antineoplastic radiation therapy: Secondary | ICD-10-CM | POA: Diagnosis not present

## 2019-06-17 DIAGNOSIS — C61 Malignant neoplasm of prostate: Secondary | ICD-10-CM | POA: Diagnosis not present

## 2019-06-18 ENCOUNTER — Other Ambulatory Visit: Payer: Self-pay

## 2019-06-18 ENCOUNTER — Ambulatory Visit
Admission: RE | Admit: 2019-06-18 | Discharge: 2019-06-18 | Disposition: A | Discharge status: Home / Self Care | Payer: Medicare Other | Source: Ambulatory Visit | Attending: Radiation Oncology | Admitting: Radiation Oncology

## 2019-06-18 DIAGNOSIS — C61 Malignant neoplasm of prostate: Secondary | ICD-10-CM | POA: Diagnosis not present

## 2019-06-18 DIAGNOSIS — Z510 Encounter for antineoplastic radiation therapy: Secondary | ICD-10-CM | POA: Diagnosis not present

## 2019-06-19 ENCOUNTER — Other Ambulatory Visit: Payer: Self-pay

## 2019-06-19 ENCOUNTER — Ambulatory Visit
Admission: RE | Admit: 2019-06-19 | Discharge: 2019-06-19 | Disposition: A | Discharge status: Home / Self Care | Payer: Medicare Other | Source: Ambulatory Visit | Attending: Radiation Oncology | Admitting: Radiation Oncology

## 2019-06-19 DIAGNOSIS — C61 Malignant neoplasm of prostate: Secondary | ICD-10-CM | POA: Diagnosis not present

## 2019-06-19 DIAGNOSIS — Z510 Encounter for antineoplastic radiation therapy: Secondary | ICD-10-CM | POA: Diagnosis not present

## 2019-06-20 ENCOUNTER — Other Ambulatory Visit: Payer: Self-pay

## 2019-06-20 ENCOUNTER — Ambulatory Visit
Admission: RE | Admit: 2019-06-20 | Discharge: 2019-06-20 | Disposition: A | Discharge status: Home / Self Care | Payer: Medicare Other | Source: Ambulatory Visit | Attending: Radiation Oncology | Admitting: Radiation Oncology

## 2019-06-20 DIAGNOSIS — C61 Malignant neoplasm of prostate: Secondary | ICD-10-CM | POA: Diagnosis not present

## 2019-06-20 DIAGNOSIS — Z510 Encounter for antineoplastic radiation therapy: Secondary | ICD-10-CM | POA: Diagnosis not present

## 2019-06-23 ENCOUNTER — Ambulatory Visit
Admission: RE | Admit: 2019-06-23 | Discharge: 2019-06-23 | Disposition: A | Discharge status: Home / Self Care | Payer: Medicare Other | Source: Ambulatory Visit | Attending: Radiation Oncology | Admitting: Radiation Oncology

## 2019-06-23 ENCOUNTER — Other Ambulatory Visit: Payer: Self-pay

## 2019-06-23 DIAGNOSIS — Z510 Encounter for antineoplastic radiation therapy: Secondary | ICD-10-CM | POA: Diagnosis not present

## 2019-06-23 DIAGNOSIS — C61 Malignant neoplasm of prostate: Secondary | ICD-10-CM | POA: Diagnosis not present

## 2019-06-24 ENCOUNTER — Ambulatory Visit
Admission: RE | Admit: 2019-06-24 | Discharge: 2019-06-24 | Disposition: A | Discharge status: Home / Self Care | Payer: Medicare Other | Source: Ambulatory Visit | Attending: Radiation Oncology | Admitting: Radiation Oncology

## 2019-06-24 ENCOUNTER — Other Ambulatory Visit: Payer: Self-pay

## 2019-06-24 ENCOUNTER — Encounter: Payer: Self-pay | Admitting: Medical Oncology

## 2019-06-24 DIAGNOSIS — Z510 Encounter for antineoplastic radiation therapy: Secondary | ICD-10-CM | POA: Diagnosis not present

## 2019-06-24 DIAGNOSIS — C61 Malignant neoplasm of prostate: Secondary | ICD-10-CM | POA: Diagnosis not present

## 2019-06-25 ENCOUNTER — Other Ambulatory Visit: Payer: Self-pay

## 2019-06-25 ENCOUNTER — Ambulatory Visit
Admission: RE | Admit: 2019-06-25 | Discharge: 2019-06-25 | Disposition: A | Discharge status: Home / Self Care | Payer: Medicare Other | Source: Ambulatory Visit | Attending: Radiation Oncology | Admitting: Radiation Oncology

## 2019-06-25 DIAGNOSIS — Z510 Encounter for antineoplastic radiation therapy: Secondary | ICD-10-CM | POA: Diagnosis not present

## 2019-06-25 DIAGNOSIS — C61 Malignant neoplasm of prostate: Secondary | ICD-10-CM | POA: Diagnosis not present

## 2019-06-26 ENCOUNTER — Ambulatory Visit
Admission: RE | Admit: 2019-06-26 | Discharge: 2019-06-26 | Disposition: A | Discharge status: Home / Self Care | Payer: Medicare Other | Source: Ambulatory Visit | Attending: Radiation Oncology | Admitting: Radiation Oncology

## 2019-06-26 ENCOUNTER — Other Ambulatory Visit: Payer: Self-pay

## 2019-06-26 DIAGNOSIS — Z510 Encounter for antineoplastic radiation therapy: Secondary | ICD-10-CM | POA: Diagnosis not present

## 2019-06-26 DIAGNOSIS — C61 Malignant neoplasm of prostate: Secondary | ICD-10-CM | POA: Diagnosis not present

## 2019-06-27 ENCOUNTER — Other Ambulatory Visit: Payer: Self-pay

## 2019-06-27 ENCOUNTER — Ambulatory Visit
Admission: RE | Admit: 2019-06-27 | Discharge: 2019-06-27 | Disposition: A | Discharge status: Home / Self Care | Payer: Medicare Other | Source: Ambulatory Visit | Attending: Radiation Oncology | Admitting: Radiation Oncology

## 2019-06-27 DIAGNOSIS — Z510 Encounter for antineoplastic radiation therapy: Secondary | ICD-10-CM | POA: Diagnosis not present

## 2019-06-27 DIAGNOSIS — C61 Malignant neoplasm of prostate: Secondary | ICD-10-CM | POA: Diagnosis not present

## 2019-06-30 ENCOUNTER — Encounter: Payer: Self-pay | Admitting: Medical Oncology

## 2019-06-30 ENCOUNTER — Ambulatory Visit
Admission: RE | Admit: 2019-06-30 | Discharge: 2019-06-30 | Disposition: A | Discharge status: Home / Self Care | Payer: Medicare Other | Source: Ambulatory Visit | Attending: Radiation Oncology | Admitting: Radiation Oncology

## 2019-06-30 ENCOUNTER — Other Ambulatory Visit: Payer: Self-pay

## 2019-06-30 ENCOUNTER — Encounter: Payer: Self-pay | Admitting: Radiation Oncology

## 2019-06-30 DIAGNOSIS — Z510 Encounter for antineoplastic radiation therapy: Secondary | ICD-10-CM | POA: Diagnosis not present

## 2019-06-30 DIAGNOSIS — C61 Malignant neoplasm of prostate: Secondary | ICD-10-CM | POA: Diagnosis not present

## 2019-06-30 NOTE — Progress Notes (Signed)
Congratulated Adam Gonzales on completion of radiation. He states he has tolerated treatments well. We discussed diet, exercise and follow up.He is scheduled with Ashlyn, Adam Gonzales 7/7. He currently does not have an appointment with Adam Gonzales but will schedule. 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 I encouraged him to call with questions or concerns. He voiced understanding of the above.

## 2019-07-09 DIAGNOSIS — I129 Hypertensive chronic kidney disease with stage 1 through stage 4 chronic kidney disease, or unspecified chronic kidney disease: Secondary | ICD-10-CM | POA: Diagnosis not present

## 2019-07-09 DIAGNOSIS — R319 Hematuria, unspecified: Secondary | ICD-10-CM | POA: Diagnosis not present

## 2019-07-09 DIAGNOSIS — N182 Chronic kidney disease, stage 2 (mild): Secondary | ICD-10-CM | POA: Diagnosis not present

## 2019-07-09 DIAGNOSIS — E269 Hyperaldosteronism, unspecified: Secondary | ICD-10-CM | POA: Diagnosis not present

## 2019-07-31 ENCOUNTER — Telehealth: Payer: Self-pay | Admitting: Radiation Oncology

## 2019-07-31 NOTE — Telephone Encounter (Signed)
Received voicemail message from patient questioning if "everything is ok" because he passed a dime size amount of blood several mornings ago about 2 am. Patient denied passing any blood before or after this one episode. Phoned patient back. No answer. Left voicemail message explaining that he is OK and most likely the blood he saw was old blood from the fiducial marker placement. Patient encouraged to call back with any further questions.

## 2019-08-08 DIAGNOSIS — H903 Sensorineural hearing loss, bilateral: Secondary | ICD-10-CM | POA: Diagnosis not present

## 2019-08-08 DIAGNOSIS — H6983 Other specified disorders of Eustachian tube, bilateral: Secondary | ICD-10-CM | POA: Diagnosis not present

## 2019-08-12 ENCOUNTER — Encounter: Payer: Self-pay | Admitting: Urology

## 2019-08-12 NOTE — Progress Notes (Signed)
Patient presents for a 1 month follow-up telephone appointment with Ashltn Bruning PA for Prostate Cancer radiation treatment. Patient states nocturia 2-3 times per night. Patient denies having dysuria or hematuria. Patient states moderate urine stream. Patient states bowel movements are regular. Patient states that he is emptying his bladder completely most of the time. Patient states urgency and is able to make it to the bathroom. Patient denies having any hesitancy or straining. Patient states mild fatigue. Patient denies leakage.

## 2019-08-13 ENCOUNTER — Other Ambulatory Visit: Payer: Self-pay

## 2019-08-13 ENCOUNTER — Ambulatory Visit
Admission: RE | Admit: 2019-08-13 | Discharge: 2019-08-13 | Disposition: A | Discharge status: Home / Self Care | Payer: Medicare Other | Source: Ambulatory Visit | Attending: Urology | Admitting: Urology

## 2019-08-13 DIAGNOSIS — C61 Malignant neoplasm of prostate: Secondary | ICD-10-CM

## 2019-08-13 NOTE — Progress Notes (Signed)
Radiation Oncology         [phone] ________________________________  Name: Adam Gonzales MRN: [id number]  Date: 08/13/2019  DOB: Aug 31, 1945  Post Treatment Note  CC: Vivi Barrack, MD  Levin Erp, MD  Diagnosis:   74 y.o. gentleman with Stage T1c adenocarcinoma of the prostate with Gleason score of 3+4, and PSA of 21.3 (42.6 adjusted for finasteride).  Interval Since Last Radiation:  6.5 weeks  05/22/19 - 06/30/19:  The prostate was treated to 70 Gy in 28 fractions Narrative: I spoke with the patient to conduct his routine scheduled 1 month follow up visit via telephone to spare the patient unnecessary potential exposure in the healthcare setting during the current COVID-19 pandemic.  The patient was notified in advance and gave permission to proceed with this visit format.  He tolerated treatment relatively well with moderate LUTS with increased urinary frequency, urgency, hesitancy, weak stream and nocturia 4-5 times per night. He did experience some bowel urgency but denied outright diarrhea. He also experienced moderate to severe fatigue intermittently.                               On review of systems, the patient states that he is doing quite well overall. He reports feeling approximately 85 to 90% back to his baseline regarding his LUTS and energy. He continues with occasional intermittency and nocturia x2 per night but specifically denies dysuria, gross hematuria, straining to void, incomplete bladder emptying, excessive daytime frequency, urgency or incontinence. His current IPSS score is 7, indicating mild urinary symptoms only. He has continued taking Flomax and finasteride daily. He reports that his bowels are almost back to normal but continues with occasional constipation which is managed with increased fluids and prune juice. He denies abdominal pain, nausea, vomiting or diarrhea. He reports a healthy appetite and is maintaining his weight. Overall, he is quite pleased  with his progress to date.  ALLERGIES:  has No Known Allergies.  Meds: Current Outpatient Medications  Medication Sig Dispense Refill  . aspirin EC 81 MG tablet Take 81 mg by mouth daily.    . finasteride (PROSCAR) 5 MG tablet Take 5 mg by mouth daily.  10  . fluticasone (FLONASE) 50 MCG/ACT nasal spray Place into both nostrils daily.    . hydrochlorothiazide (HYDRODIURIL) 12.5 MG tablet Take 12.5 mg by mouth daily.    Marland Kitchen lisinopril (PRINIVIL,ZESTRIL) 40 MG tablet Take 40 mg by mouth daily.  2  . meloxicam (MOBIC) 15 MG tablet Take 15 mg by mouth daily.    Marland Kitchen spironolactone (ALDACTONE) 25 MG tablet Take 25 mg by mouth daily.    . Tamsulosin HCl (FLOMAX) 0.4 MG CAPS Take 0.4 mg by mouth daily.      No current facility-administered medications for this encounter.    Physical Findings:  vitals were not taken for this visit.   /Unable to assess due to telephone follow-up visit format.  Lab Findings: Lab Results  Component Value Date   WBC 9.7 01/05/2018   HGB 14.3 01/05/2018   HCT 46.9 01/05/2018   MCV 97.3 01/05/2018   PLT 273 01/05/2018     Radiographic Findings: No results found.  Impression/Plan: 1. 74 y.o. gentleman with Stage T1c adenocarcinoma of the prostate with Gleason score of 3+4, and PSA of 21.3 (42.6 adjusted for finasteride). He will continue to follow up with urology for ongoing PSA determinations but does not currently have an  appointment scheduled with Dr. Louis Meckel to his knowledge. We discussed the typical posttreatment follow-up occurring approximately 2 to 3 months after completion of radiation with a posttreatment PSA at that time. I advised him that I will share our discussion with Dr. Louis Meckel and anticipate that his staff will contact the patient to coordinate the follow-up visit but if he has not heard from anyone in the next 1 to 2 weeks, he will call [facility] to schedule a follow-up visit himself. He understands what to expect with regards to PSA  monitoring going forward. I will look forward to following his response to treatment via correspondence with urology, and would be happy to continue to participate in his care if clinically indicated. I talked to the patient about what to expect in the future, including his risk for erectile dysfunction and rectal bleeding. I encouraged him to call or return to the office if he has any questions regarding his previous radiation or possible radiation side effects. He was comfortable with this plan and will follow up as needed.    Nicholos Johns, PA-C

## 2019-08-18 ENCOUNTER — Telehealth: Payer: Self-pay | Admitting: Radiation Oncology

## 2019-08-18 NOTE — Telephone Encounter (Signed)
Sam, Please reassure patient that this is likely just secondary to some residual prostatitis from his recent radiation and occurring with his increased activity level. However, with the history of microscopic hematuria previously, this should be further evaluated, but not urgent, with his Urologist, Dr. Louis Meckel. He should have more urgent evaluation of this if associated dysuria, gross blood in the commode with voiding, passage of blood clots in urine, flank pain, fever or chills develops. Ailene Ards

## 2019-08-18 NOTE — Telephone Encounter (Signed)
Received voicemail message from patient requesting return call. Phoned patient back to inquire. Patient reports two weeks ago he saw a dime size amount of "pinkish colored spotting" in his underwear. He denies seeing anymore until the weekend when he had three more inconsistent episodes of this. Reports his urine stream is steady without difficulty emptying his bladder. Denies dysuria or pain associated with urination. Denies any recent strenuous activity. Denies any bowel complaints. Reports he doesn't have a follow up appointment scheduled with Dr. Louis Meckel yet. Patient explains a prior episode where his urologist told him he saw microscopic blood in his urine but "never explained why or if there was need for concern." Patient understands this RN will inform Dr. Tammi Klippel and PA Bruning of these findings then phone him back with directions.

## 2019-08-19 ENCOUNTER — Telehealth: Payer: Self-pay | Admitting: Radiation Oncology

## 2019-08-19 NOTE — Telephone Encounter (Signed)
Phoned patient. Attempted to reassure patient that this is likely just secondary to some residual prostatitis from his recent radiation and occurring with his increased activity level. Instructed patient that because of his history of microscopic hematuria he should be further evaluated, but not urgently, with his Urologist, Dr. Louis Meckel. He should have more urgent evaluation of this if associated dysuria, gross blood in the commode with voiding, passage of blood clots in urine, flank pain, fever or chills develops. Patient verbalized understanding of all reviewed and appreciation for the call back.

## 2019-09-01 ENCOUNTER — Encounter: Payer: Self-pay | Admitting: Medical Oncology

## 2019-09-04 ENCOUNTER — Encounter: Payer: Self-pay | Admitting: Medical Oncology

## 2019-09-04 NOTE — Progress Notes (Signed)
Called pt to let him know I got him scheduled for PSA 8/22@11 :15 am and follow up with Dr. Louis Meckel 8/31@ 10:45 am. I asked him to call me back to confirm.

## 2019-09-04 NOTE — Progress Notes (Signed)
Patient called stating he is doing very well post radiation. 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 He has really enjoyed helping others and would like to continue. I asked if he made his follow up with Dr. Louis Meckel and he has not. I will call and make these appointments and return his call.

## 2019-09-08 ENCOUNTER — Encounter: Payer: Self-pay | Admitting: Medical Oncology

## 2019-09-08 NOTE — Progress Notes (Signed)
Patient left a message that dates for lab and follow up with Dr. Louis Meckel was good but not the time. I left him a message asking him to call [facility] and speak with scheduling to discuss times that worked for him. I stressed the importance of follow up with PSA checks.  I asked him to call me back if I can assist him in any way.

## 2019-09-08 NOTE — Progress Notes (Signed)
Patient called to inform me he was able to get his appointments with Dr. 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 He was very appreciative of my getting them scheduled.

## 2019-09-30 ENCOUNTER — Other Ambulatory Visit: Payer: Self-pay

## 2019-09-30 ENCOUNTER — Encounter: Payer: Self-pay | Admitting: Family Medicine

## 2019-09-30 ENCOUNTER — Ambulatory Visit (INDEPENDENT_AMBULATORY_CARE_PROVIDER_SITE_OTHER): Payer: Medicare Other | Admitting: Family Medicine

## 2019-09-30 VITALS — BP 168/75 | HR 65 | Temp 98.5°F | Ht 69.5 in | Wt 186.6 lb

## 2019-09-30 DIAGNOSIS — Z13220 Encounter for screening for lipoid disorders: Secondary | ICD-10-CM

## 2019-09-30 DIAGNOSIS — Z0001 Encounter for general adult medical examination with abnormal findings: Secondary | ICD-10-CM

## 2019-09-30 DIAGNOSIS — R739 Hyperglycemia, unspecified: Secondary | ICD-10-CM

## 2019-09-30 DIAGNOSIS — I10 Essential (primary) hypertension: Secondary | ICD-10-CM

## 2019-09-30 DIAGNOSIS — C61 Malignant neoplasm of prostate: Secondary | ICD-10-CM | POA: Diagnosis not present

## 2019-09-30 DIAGNOSIS — N182 Chronic kidney disease, stage 2 (mild): Secondary | ICD-10-CM | POA: Diagnosis not present

## 2019-09-30 NOTE — Patient Instructions (Signed)
It was very nice to see you today!  No changes today.  Keep up the good work!  I will see you back in a year for your next physical with blood work, or sooner if needed.   Take care, Dr [name]  Please try these tips to maintain a healthy lifestyle:   Eat at least 3 REAL meals and 1-2 snacks per day.  Aim for no more than 5 hours between eating.  If you eat breakfast, please do so within one hour of getting up.    Each meal should contain half fruits/vegetables, one quarter protein, and one quarter carbs (no bigger than a computer mouse)   Cut down on sweet beverages. This includes juice, soda, and sweet tea.     Drink at least 1 glass of water with each meal and aim for at least 8 glasses per day   Exercise at least 150 minutes every week.    Preventive Care 19 Years and Older, Male Preventive care refers to lifestyle choices and visits with your health care provider that can promote health and wellness. This includes:  A yearly physical exam. This is also called an annual well check.  Regular dental and eye exams.  Immunizations.  Screening for certain conditions.  Healthy lifestyle choices, such as diet and exercise. What can I expect for my preventive care visit? Physical exam Your health care provider will check:  Height and weight. These may be used to calculate body mass index (BMI), which is a measurement that tells if you are at a healthy weight.  Heart rate and blood pressure.  Your skin for abnormal spots. Counseling Your health care provider may ask you questions about:  Alcohol, tobacco, and drug use.  Emotional well-being.  Home and relationship well-being.  Sexual activity.  Eating habits.  History of falls.  Memory and ability to understand (cognition).  Work and work Statistician. What immunizations do I need?  Influenza (flu) vaccine  This is recommended every year. Tetanus, diphtheria, and pertussis (Tdap) vaccine  You may  need a Td booster every 10 years. Varicella (chickenpox) vaccine  You may need this vaccine if you have not already been vaccinated. Zoster (shingles) vaccine  You may need this after age 99. Pneumococcal conjugate (PCV13) vaccine  One dose is recommended after age 37. Pneumococcal polysaccharide (PPSV23) vaccine  One dose is recommended after age 3. Measles, mumps, and rubella (MMR) vaccine  You may need at least one dose of MMR if you were born in 1957 or later. You may also need a second dose. Meningococcal conjugate (MenACWY) vaccine  You may need this if you have certain conditions. Hepatitis A vaccine  You may need this if you have certain conditions or if you travel or work in places where you may be exposed to hepatitis A. Hepatitis B vaccine  You may need this if you have certain conditions or if you travel or work in places where you may be exposed to hepatitis B. Haemophilus influenzae type b (Hib) vaccine  You may need this if you have certain conditions. You may receive vaccines as individual doses or as more than one vaccine together in one shot (combination vaccines). Talk with your health care provider about the risks and benefits of combination vaccines. What tests do I need? Blood tests  Lipid and cholesterol levels. These may be checked every 5 years, or more frequently depending on your overall health.  Hepatitis C test.  Hepatitis B test. Screening  Lung  cancer screening. You may have this screening every year starting at age 57 if you have a 30-pack-year history of smoking and currently smoke or have quit within the past 15 years.  Colorectal cancer screening. All adults should have this screening starting at age 38 and continuing until age 69. Your health care provider may recommend screening at age 67 if you are at increased risk. You will have tests every 1-10 years, depending on your results and the type of screening test.  Prostate cancer  screening. Recommendations will vary depending on your family history and other risks.  Diabetes screening. This is done by checking your blood sugar (glucose) after you have not eaten for a while (fasting). You may have this done every 1-3 years.  Abdominal aortic aneurysm (AAA) screening. You may need this if you are a current or former smoker.  Sexually transmitted disease (STD) testing. Follow these instructions at home: Eating and drinking  Eat a diet that includes fresh fruits and vegetables, whole grains, lean protein, and low-fat dairy products. Limit your intake of foods with high amounts of sugar, saturated fats, and salt.  Take vitamin and mineral supplements as recommended by your health care provider.  Do not drink alcohol if your health care provider tells you not to drink.  If you drink alcohol: ? Limit how much you have to 0-2 drinks a day. ? Be aware of how much alcohol is in your drink. In the [place] one drink equals one 12 oz bottle of beer (355 mL), one 5 oz glass of wine (148 mL), or one 1 oz glass of hard liquor (44 mL). Lifestyle  Take daily care of your teeth and gums.  Stay active. Exercise for at least 30 minutes on 5 or more days each week.  Do not use any products that contain nicotine or tobacco, such as cigarettes, e-cigarettes, and chewing tobacco. If you need help quitting, ask your health care provider.  If you are sexually active, practice safe sex. Use a condom or other form of protection to prevent STIs (sexually transmitted infections).  Talk with your health care provider about taking a low-dose aspirin or statin. What's next?  Visit your health care provider once a year for a well check visit.  Ask your health care provider how often you should have your eyes and teeth checked.  Stay up to date on all vaccines. This information is not intended to replace advice given to you by your health care provider. Make sure you discuss any questions  you have with your health care provider. Document Revised: 01/17/2018 Document Reviewed: 01/17/2018 Elsevier Patient Education  2020 [employer].

## 2019-09-30 NOTE — Assessment & Plan Note (Signed)
Continue management per urology. 

## 2019-09-30 NOTE — Assessment & Plan Note (Addendum)
Typically in 120s/50s at home. Continue lisinopril 40mg  daily, HCTz 12.5mg  daily, and spironolactone 25mg  daily. Check CBC, CMET, TSH. Continue home monitoring goal 150/90 or lower.

## 2019-09-30 NOTE — Progress Notes (Signed)
Chief Complaint:  Adam Gonzales is a 75 y.o. male who presents today for his annual comprehensive physical exam.    Assessment/Plan:  Chronic Problems Addressed Today: CKD (chronic kidney disease) stage 2, GFR 60-89 ml/min Check CMET.   HTN (hypertension) Typically in 120s/50s at home. Continue lisinopril 40mg  daily, HCTz 12.5mg  daily, and spironolactone 25mg  daily. Check CBC, CMET, TSH. Continue home monitoring goal 150/90 or lower.   Hyperglycemia Check A1c.   Malignant neoplasm of prostate [facility]) Continue management per urology.   Preventative Healthcare: UTD on vaccines and screenings.   Patient Counseling(The following topics were reviewed and/or handout was given):  -Nutrition: Stressed importance of moderation in sodium/caffeine intake, saturated fat and cholesterol, caloric balance, sufficient intake of fresh fruits, vegetables, and fiber.  -Stressed the importance of regular exercise.   -Substance Abuse: Discussed cessation/primary prevention of tobacco, alcohol, or other drug use; driving or other dangerous activities under the influence; availability of treatment for abuse.   -Injury prevention: Discussed safety belts, safety helmets, smoke detector, smoking near bedding or upholstery.   -Sexuality: Discussed sexually transmitted diseases, partner selection, use of condoms, avoidance of unintended pregnancy and contraceptive alternatives.   -Dental health: Discussed importance of regular tooth brushing, flossing, and dental visits.  -Health maintenance and immunizations reviewed. Please refer to Health maintenance section.  Return to care in 1 year for next preventative visit.     Subjective:  HPI:  He has no acute complaints today.   Lifestyle Diet: Balanced. Gets plenty of fruits and vegetables.  Exercise: Goes to gym every other day. 75% cardio. 25% weight training.   Health Maintenance Due  Topic Date Due  . Hepatitis C Screening  Never done    ROS:  Per HPI, otherwise a complete review of systems was negative.   PMH:  The following were reviewed and entered/updated in epic: Past Medical History:  Diagnosis Date  . 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 Gout   . Hypertension   . Prostate cancer ([place])   . Quadriceps tendon rupture, right, initial encounter    Patient Active Problem List   Diagnosis Date Noted  . Hyperglycemia 09/30/2019  . Malignant neoplasm of prostate ([place]) 03/18/2019  . 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 HTN (hypertension) 01/02/2018  . 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 CYST EXCISION    . HYDROCELE EXCISION Right   . PROSTATE BIOPSY    . QUADRICEPS TENDON REPAIR Right 01/14/2018   Procedure: REPAIR RIGHT QUADRICEPS TENDON;  Surgeon: Leandrew Koyanagi, MD;  Location: [facility];  Service: Orthopedics;  Laterality: Right;    Family History  Problem Relation Age of Onset  . Heart failure Mother   . Cancer - Colon Father   . Dementia Sister   . Throat cancer Brother   . Pancreatic cancer Neg Hx   . Breast cancer Neg Hx   . Prostate cancer Neg Hx     Medications- reviewed and updated Current Outpatient Medications  Medication Sig Dispense Refill  . aspirin EC 81 MG tablet Take 81 mg by mouth daily.    . finasteride (PROSCAR) 5 MG tablet Take 5 mg by mouth daily.  10  . fluticasone (FLONASE) 50 MCG/ACT nasal spray Place into both nostrils daily.    . hydrochlorothiazide (HYDRODIURIL) 12.5 MG tablet Take 12.5 mg by mouth daily.    Marland Kitchen lisinopril (PRINIVIL,ZESTRIL) 40 MG tablet Take 40  mg by mouth daily.  2  . meloxicam (MOBIC) 15 MG tablet Take 15 mg by mouth daily.    Marland Kitchen spironolactone (ALDACTONE) 25 MG tablet Take 25 mg by mouth daily.    . Tamsulosin HCl (FLOMAX) 0.4 MG CAPS Take 0.4 mg by mouth daily.      No current facility-administered medications for this visit.    Allergies-reviewed and  updated No Known Allergies  Social History   Socioeconomic History  . Marital status: Married    Spouse name: Not on file  . Number of children: Not on file  . Years of education: Not on file  . Highest education level: Not on file  Occupational History  . Not on file  Tobacco Use  . Smoking status: Never Smoker  . Smokeless tobacco: Never Used  Vaping Use  . Vaping Use: Never used  Substance and Sexual Activity  . Alcohol use: Yes    Comment: social  . Drug use: No  . Sexual activity: Not Currently  Other Topics Concern  . Not on file  Social History Narrative  . Not on file   Social Determinants of Health   Financial Resource Strain:   . Difficulty of Paying Living Expenses: Not on file  Food Insecurity:   . Worried About Charity fundraiser in the Last Year: Not on file  . Ran Out of Food in the Last Year: Not on file  Transportation Needs:   . Lack of Transportation (Medical): Not on file  . Lack of Transportation (Non-Medical): Not on file  Physical Activity:   . Days of Exercise per Week: Not on file  . Minutes of Exercise per Session: Not on file  Stress:   . Feeling of Stress : Not on file  Social Connections:   . Frequency of Communication with Friends and Family: Not on file  . Frequency of Social Gatherings with Friends and Family: Not on file  . Attends Religious Services: Not on file  . Active Member of Clubs or Organizations: Not on file  . Attends Archivist Meetings: Not on file  . Marital Status: Not on file        Objective:  Physical Exam: BP (!) 168/75   Pulse 65   Temp 98.5 F (36.9 C) (Temporal)   Ht 5' 9.5" (1.765 m)   Wt 186 lb 9.6 oz (84.6 kg)   SpO2 97%   BMI 27.16 kg/m   Body mass index is 27.16 kg/m. Wt Readings from Last 3 Encounters:  09/30/19 186 lb 9.6 oz (84.6 kg)  04/14/19 189 lb 3.2 oz (85.8 kg)  03/18/19 190 lb (86.2 kg)  Gen: NAD, resting comfortably HEENT: TMs normal bilaterally. OP clear. No  thyromegaly noted.  CV: RRR with no murmurs appreciated Pulm: NWOB, CTAB with no crackles, wheezes, or rhonchi GI: Normal bowel sounds present. Soft, Nontender, Nondistended. MSK: no edema, cyanosis, or clubbing noted Skin: warm, dry Neuro: CN2-12 grossly intact. Strength 5/5 in upper and lower extremities. Reflexes symmetric and intact bilaterally.  Psych: Normal affect and thought content     Briana Newman M. [name], MD 09/30/2019 3:32 PM

## 2019-09-30 NOTE — Assessment & Plan Note (Signed)
Check CMET. 

## 2019-09-30 NOTE — Assessment & Plan Note (Signed)
Check A1c. 

## 2019-10-01 LAB — CBC
HCT: 41.6 % (ref 38.5–50.0)
Hemoglobin: 14.2 g/dL (ref 13.2–17.1)
MCH: 31.2 pg (ref 27.0–33.0)
MCHC: 34.1 g/dL (ref 32.0–36.0)
MCV: 91.4 fL (ref 80.0–100.0)
MPV: 9.6 fL (ref 7.5–12.5)
Platelets: 235 10*3/uL (ref 140–400)
RBC: 4.55 10*6/uL (ref 4.20–5.80)
RDW: 12.9 % (ref 11.0–15.0)
WBC: 6.4 10*3/uL (ref 3.8–10.8)

## 2019-10-01 LAB — LIPID PANEL
Cholesterol: 156 mg/dL (ref <200)
HDL: 59 mg/dL (ref >=40)
LDL Cholesterol (Calc): 77 mg/dL (calc)
Non-HDL Cholesterol (Calc): 97 mg/dL (calc) (ref <130)
Total CHOL/HDL Ratio: 2.6 (calc) (ref <5.0)
Triglycerides: 117 mg/dL (ref <150)

## 2019-10-01 LAB — HEMOGLOBIN A1C
Hgb A1c MFr Bld: 6.2 % of total Hgb — ABNORMAL HIGH (ref <5.7)
Mean Plasma Glucose: 131 (calc)
eAG (mmol/L): 7.3 (calc)

## 2019-10-01 LAB — COMPREHENSIVE METABOLIC PANEL
AG Ratio: 1.9 (calc) (ref 1.0–2.5)
ALT: 21 U/L (ref 9–46)
AST: 28 U/L (ref 10–35)
Albumin: 4.3 g/dL (ref 3.6–5.1)
Alkaline phosphatase (APISO): 56 U/L (ref 35–144)
BUN: 21 mg/dL (ref 7–25)
CO2: 27 mmol/L (ref 20–32)
Calcium: 9.7 mg/dL (ref 8.6–10.3)
Chloride: 102 mmol/L (ref 98–110)
Creat: 1.12 mg/dL (ref 0.70–1.18)
Globulin: 2.3 g/dL (calc) (ref 1.9–3.7)
Glucose, Bld: 106 mg/dL — ABNORMAL HIGH (ref 65–99)
Potassium: 3.7 mmol/L (ref 3.5–5.3)
Sodium: 137 mmol/L (ref 135–146)
Total Bilirubin: 0.8 mg/dL (ref 0.2–1.2)
Total Protein: 6.6 g/dL (ref 6.1–8.1)

## 2019-10-01 LAB — TSH: TSH: 2.59 mIU/L (ref 0.40–4.50)

## 2019-10-01 NOTE — Progress Notes (Signed)
Please inform patient of the following:  His A1c is in the prediabetic range but everything else is NORMAL. Do not need to start any medications at this point but would like for him to keep working on keeping a healthy diet low in sugar and starches and keep up regular exercise and we can recheck in a year.  Adam Gonzales. [name], MD 10/01/2019 8:05 AM

## 2019-10-02 ENCOUNTER — Ambulatory Visit: Payer: Medicare Other | Admitting: Family Medicine

## 2019-10-20 DIAGNOSIS — C61 Malignant neoplasm of prostate: Secondary | ICD-10-CM | POA: Diagnosis not present

## 2019-10-27 DIAGNOSIS — C61 Malignant neoplasm of prostate: Secondary | ICD-10-CM | POA: Diagnosis not present

## 2019-11-13 DIAGNOSIS — H903 Sensorineural hearing loss, bilateral: Secondary | ICD-10-CM | POA: Diagnosis not present

## 2019-11-13 DIAGNOSIS — H6983 Other specified disorders of Eustachian tube, bilateral: Secondary | ICD-10-CM | POA: Diagnosis not present

## 2019-12-20 ENCOUNTER — Ambulatory Visit: Payer: Medicare Other | Attending: Internal Medicine

## 2019-12-20 DIAGNOSIS — Z23 Encounter for immunization: Secondary | ICD-10-CM

## 2019-12-20 NOTE — Progress Notes (Signed)
   Covid-19 Vaccination Clinic  Name:  Adam Gonzales    MRN: [id number] DOB: 05/21/1945  12/20/2019  [name] was observed post Covid-19 immunization for 15 minutes without incident. He was provided with Vaccine Information Sheet and instruction to access the V-Safe system.   [name] was instructed to call 911 with any severe reactions post vaccine: Marland Kitchen Difficulty breathing  . Swelling of face and throat  . A fast heartbeat  . A bad rash all over body  . Dizziness and weakness   Immunizations Administered    Name Date Dose VIS Date Route   Pfizer COVID-19 Vaccine 12/20/2019  4:29 PM 0.3 mL 11/26/2019 Intramuscular   Manufacturer: [place]   Lot: [id number]   [place]: [id number]

## 2020-01-07 DIAGNOSIS — I129 Hypertensive chronic kidney disease with stage 1 through stage 4 chronic kidney disease, or unspecified chronic kidney disease: Secondary | ICD-10-CM | POA: Diagnosis not present

## 2020-01-07 DIAGNOSIS — E269 Hyperaldosteronism, unspecified: Secondary | ICD-10-CM | POA: Diagnosis not present

## 2020-01-14 ENCOUNTER — Ambulatory Visit (INDEPENDENT_AMBULATORY_CARE_PROVIDER_SITE_OTHER): Payer: Medicare Other

## 2020-01-14 ENCOUNTER — Other Ambulatory Visit: Payer: Self-pay

## 2020-01-14 ENCOUNTER — Encounter: Payer: Self-pay | Admitting: Family Medicine

## 2020-01-14 DIAGNOSIS — Z23 Encounter for immunization: Secondary | ICD-10-CM

## 2020-01-14 NOTE — Progress Notes (Signed)
  Radiation Oncology         [phone] ________________________________  Name: Adam Gonzales MRN: [id number]  Date: 06/30/2019  DOB: 29-Mar-1945  End of Treatment Note  Diagnosis:   74 y.o. gentleman with Stage T1c adenocarcinoma of the prostate with Gleason score of 3+4, and PSA of 21.3 (42.6 adjusted for finasteride).     Indication for treatment:  Curative, Definitive Radiotherapy       Radiation treatment dates:   05/22/19-06/30/19  Site/dose:   The prostate was treated to 70 Gy in 28 fractions of 2.5 Gy  Beams/energy:   The patient was treated with IMRT using volumetric arc therapy delivering 6 MV X-rays to clockwise and counterclockwise circumferential arcs with a 90 degree collimator offset to avoid dose scalloping.  Image guidance was performed with daily cone beam CT prior to each fraction to align to gold markers in the prostate and assure proper bladder and rectal fill volumes.  Immobilization was achieved with BodyFix custom mold.  Narrative: The patient tolerated radiation treatment relatively well.   The patient experienced some minor urinary irritation and modest fatigue.    Plan: The patient has completed radiation treatment. He will return to radiation oncology clinic for routine followup in one month. I advised him to call or return sooner if he has any questions or concerns related to his recovery or treatment. ________________________________  Sheral Apley. Tammi Klippel, M.D.

## 2020-03-08 ENCOUNTER — Telehealth: Payer: Self-pay | Admitting: Family Medicine

## 2020-03-08 NOTE — Telephone Encounter (Signed)
Left message for patient to call back and schedule Medicare Annual Wellness Visit (AWV) either virtually or in office.   Last AWV no information please schedule at anytime with  health coach  This should be a 45 minute visit. 

## 2020-03-15 ENCOUNTER — Other Ambulatory Visit: Payer: Self-pay

## 2020-03-15 ENCOUNTER — Ambulatory Visit (INDEPENDENT_AMBULATORY_CARE_PROVIDER_SITE_OTHER): Payer: Medicare Other

## 2020-03-15 DIAGNOSIS — Z0000 Encounter for general adult medical examination without abnormal findings: Secondary | ICD-10-CM | POA: Diagnosis not present

## 2020-03-15 NOTE — Patient Instructions (Addendum)
[name] , Thank you for taking time to come for your Medicare Wellness Visit. I appreciate your ongoing commitment to your health goals. Please review the following plan we discussed and let me know if I can assist you in the future.   Screening recommendations/referrals: Colonoscopy: Done 08/17/16 Recommended yearly ophthalmology/optometry visit for glaucoma screening and checkup Recommended yearly dental visit for hygiene and checkup  Vaccinations: Influenza vaccine: Done 01/14/20 Pneumococcal vaccine: Due and discussed Tdap vaccine: Due and discussed  Shingles vaccine: Completed 8/6 & 11/24/17   Covid-19: Completed 2/20, 3/16, & 12/20/19  Advanced directives: Please bring a copy of your health care power of attorney and living will to the office at your convenience.  Conditions/risks identified: None at this time  Next appointment: Follow up in one year for your annual wellness visit.   Preventive Care 20 Years and Older, Male Preventive care refers to lifestyle choices and visits with your health care provider that can promote health and wellness. What does preventive care include?  A yearly physical exam. This is also called an annual well check.  Dental exams once or twice a year.  Routine eye exams. Ask your health care provider how often you should have your eyes checked.  Personal lifestyle choices, including:  Daily care of your teeth and gums.  Regular physical activity.  Eating a healthy diet.  Avoiding tobacco and drug use.  Limiting alcohol use.  Practicing safe sex.  Taking low doses of aspirin every day.  Taking vitamin and mineral supplements as recommended by your health care provider. What happens during an annual well check? The services and screenings done by your health care provider during your annual well check will depend on your age, overall health, lifestyle risk factors, and family history of disease. Counseling  Your health care  provider may ask you questions about your:  Alcohol use.  Tobacco use.  Drug use.  Emotional well-being.  Home and relationship well-being.  Sexual activity.  Eating habits.  History of falls.  Memory and ability to understand (cognition).  Work and work Statistician. Screening  You may have the following tests or measurements:  Height, weight, and BMI.  Blood pressure.  Lipid and cholesterol levels. These may be checked every 5 years, or more frequently if you are over 29 years old.  Skin check.  Lung cancer screening. You may have this screening every year starting at age 79 if you have a 30-pack-year history of smoking and currently smoke or have quit within the past 15 years.  Fecal occult blood test (FOBT) of the stool. You may have this test every year starting at age 73.  Flexible sigmoidoscopy or colonoscopy. You may have a sigmoidoscopy every 5 years or a colonoscopy every 10 years starting at age 17.  Prostate cancer screening. Recommendations will vary depending on your family history and other risks.  Hepatitis C blood test.  Hepatitis B blood test.  Sexually transmitted disease (STD) testing.  Diabetes screening. This is done by checking your blood sugar (glucose) after you have not eaten for a while (fasting). You may have this done every 1-3 years.  Abdominal aortic aneurysm (AAA) screening. You may need this if you are a current or former smoker.  Osteoporosis. You may be screened starting at age 48 if you are at high risk. Talk with your health care provider about your test results, treatment options, and if necessary, the need for more tests. Vaccines  Your health care provider may recommend  certain vaccines, such as:  Influenza vaccine. This is recommended every year.  Tetanus, diphtheria, and acellular pertussis (Tdap, Td) vaccine. You may need a Td booster every 10 years.  Zoster vaccine. You may need this after age 72.  Pneumococcal  13-valent conjugate (PCV13) vaccine. One dose is recommended after age 57.  Pneumococcal polysaccharide (PPSV23) vaccine. One dose is recommended after age 69. Talk to your health care provider about which screenings and vaccines you need and how often you need them. This information is not intended to replace advice given to you by your health care provider. Make sure you discuss any questions you have with your health care provider. Document Released: 02/19/2015 Document Revised: 10/13/2015 Document Reviewed: 11/24/2014 Elsevier Interactive Patient Education  2017 [place] Prevention in the Home Falls can cause injuries. They can happen to people of all ages. There are many things you can do to make your home safe and to help prevent falls. What can I do on the outside of my home?  Regularly fix the edges of walkways and driveways and fix any cracks.  Remove anything that might make you trip as you walk through a door, such as a raised step or threshold.  Trim any bushes or trees on the path to your home.  Use bright outdoor lighting.  Clear any walking paths of anything that might make someone trip, such as rocks or tools.  Regularly check to see if handrails are loose or broken. Make sure that both sides of any steps have handrails.  Any raised decks and porches should have guardrails on the edges.  Have any leaves, snow, or ice cleared regularly.  Use sand or salt on walking paths during winter.  Clean up any spills in your garage right away. This includes oil or grease spills. What can I do in the bathroom?  Use night lights.  Install grab bars by the toilet and in the tub and shower. Do not use towel bars as grab bars.  Use non-skid mats or decals in the tub or shower.  If you need to sit down in the shower, use a plastic, non-slip stool.  Keep the floor dry. Clean up any water that spills on the floor as soon as it happens.  Remove soap buildup in the  tub or shower regularly.  Attach bath mats securely with double-sided non-slip rug tape.  Do not have throw rugs and other things on the floor that can make you trip. What can I do in the bedroom?  Use night lights.  Make sure that you have a light by your bed that is easy to reach.  Do not use any sheets or blankets that are too big for your bed. They should not hang down onto the floor.  Have a firm chair that has side arms. You can use this for support while you get dressed.  Do not have throw rugs and other things on the floor that can make you trip. What can I do in the kitchen?  Clean up any spills right away.  Avoid walking on wet floors.  Keep items that you use a lot in easy-to-reach places.  If you need to reach something above you, use a strong step stool that has a grab bar.  Keep electrical cords out of the way.  Do not use floor polish or wax that makes floors slippery. If you must use wax, use non-skid floor wax.  Do not have throw rugs and other  things on the floor that can make you trip. What can I do with my stairs?  Do not leave any items on the stairs.  Make sure that there are handrails on both sides of the stairs and use them. Fix handrails that are broken or loose. Make sure that handrails are as long as the stairways.  Check any carpeting to make sure that it is firmly attached to the stairs. Fix any carpet that is loose or worn.  Avoid having throw rugs at the top or bottom of the stairs. If you do have throw rugs, attach them to the floor with carpet tape.  Make sure that you have a light switch at the top of the stairs and the bottom of the stairs. If you do not have them, ask someone to add them for you. What else can I do to help prevent falls?  Wear shoes that:  Do not have high heels.  Have rubber bottoms.  Are comfortable and fit you well.  Are closed at the toe. Do not wear sandals.  If you use a stepladder:  Make sure that it  is fully opened. Do not climb a closed stepladder.  Make sure that both sides of the stepladder are locked into place.  Ask someone to hold it for you, if possible.  Clearly mark and make sure that you can see:  Any grab bars or handrails.  First and last steps.  Where the edge of each step is.  Use tools that help you move around (mobility aids) if they are needed. These include:  Canes.  Walkers.  Scooters.  Crutches.  Turn on the lights when you go into a dark area. Replace any light bulbs as soon as they burn out.  Set up your furniture so you have a clear path. Avoid moving your furniture around.  If any of your floors are uneven, fix them.  If there are any pets around you, be aware of where they are.  Review your medicines with your doctor. Some medicines can make you feel dizzy. This can increase your chance of falling. Ask your doctor what other things that you can do to help prevent falls. This information is not intended to replace advice given to you by your health care provider. Make sure you discuss any questions you have with your health care provider. Document Released: 11/19/2008 Document Revised: 07/01/2015 Document Reviewed: 02/27/2014 Elsevier Interactive Patient Education  2017 [employer].

## 2020-03-15 NOTE — Progress Notes (Signed)
Virtual Visit via Telephone Note  I connected with  Adam Gonzales on 03/15/20 at  3:15 PM EST by telephone and verified that I am speaking with the correct person using two identifiers.  Medicare Annual Wellness visit completed telephonically due to Covid-19 pandemic.   Persons participating in this call: This Health Coach and this patient.   Location: Patient: Home Provider: Office   I discussed the limitations, risks, security and privacy concerns of performing an evaluation and management service by telephone and the availability of in person appointments. The patient expressed understanding and agreed to proceed.  Unable to perform video visit due to video visit attempted and failed and/or patient does not have video capability.   Some vital signs may be absent or patient reported.   Willette Brace, LPN    Subjective:   Adam Gonzales is a 75 y.o. male who presents for an Initial Medicare Annual Wellness Visit.  Review of Systems     Cardiac Risk Factors include: advanced age (>56men, >50 women);hypertension;male gender     Objective:    There were no vitals filed for this visit. There is no height or weight on file to calculate BMI.  Advanced Directives 03/15/2020 08/12/2019 03/18/2019 01/14/2018 01/08/2018 01/02/2018 04/12/2017  Does Patient Have a Medical Advance Directive? Yes Yes Yes Yes Yes No Yes  Type of Paramedic of [place];Living will [place];Living will [place];Living will Living will Living will - [place];Living will  Does patient want to make changes to medical advance directive? - - - No - Patient declined No - Patient declined - -  Copy of [place] in Chart? No - copy requested - No - copy requested - - - No - copy requested  Would patient like information on creating a medical advance directive? - - - No - Patient declined - No - Patient declined -     Current Medications (verified) Outpatient Encounter Medications as of 03/15/2020  Medication Sig  . aspirin EC 81 MG tablet Take 81 mg by mouth daily.  . finasteride (PROSCAR) 5 MG tablet Take 5 mg by mouth daily.  . hydrochlorothiazide (HYDRODIURIL) 12.5 MG tablet Take 12.5 mg by mouth daily.  Marland Kitchen lisinopril (PRINIVIL,ZESTRIL) 40 MG tablet Take 40 mg by mouth daily.  . meloxicam (MOBIC) 15 MG tablet Take 15 mg by mouth daily.  Marland Kitchen spironolactone (ALDACTONE) 25 MG tablet Take 25 mg by mouth daily.  . Tamsulosin HCl (FLOMAX) 0.4 MG CAPS Take 0.4 mg by mouth daily.   . [DISCONTINUED] fluticasone (FLONASE) 50 MCG/ACT nasal spray Place into both nostrils daily. (Patient not taking: Reported on 03/15/2020)   No facility-administered encounter medications on file as of 03/15/2020.    Allergies (verified) Patient has no known allergies.   History: Past Medical History:  Diagnosis Date  . 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 Gout   . Hypertension   . Prostate cancer ([place])   . Quadriceps tendon rupture, right, initial encounter    Past Surgical History:  Procedure Laterality Date  . CYST EXCISION    . HYDROCELE EXCISION Right   . PROSTATE BIOPSY    . QUADRICEPS TENDON REPAIR Right 01/14/2018   Procedure: REPAIR RIGHT QUADRICEPS TENDON;  Surgeon: Leandrew Koyanagi, MD;  Location: [facility];  Service: Orthopedics;  Laterality: Right;   Family History  Problem Relation Age of Onset  . Heart failure Mother   . Cancer -  Colon Father   . Dementia Sister   . Throat cancer Brother   . Pancreatic cancer Neg Hx   . Breast cancer Neg Hx   . Prostate cancer Neg Hx    Social History   Socioeconomic History  . Marital status: Married    Spouse name: Not on file  . Number of children: Not on file  . Years of education: Not on file  . Highest education level: Not on file  Occupational History  . Not on file  Tobacco Use  . Smoking status: Never Smoker  . Smokeless tobacco:  Never Used  Vaping Use  . Vaping Use: Never used  Substance and Sexual Activity  . Alcohol use: Not Currently    Comment: social  . Drug use: No  . Sexual activity: Not Currently  Other Topics Concern  . Not on file  Social History Narrative  . Not on file   Social Determinants of Health   Financial Resource Strain: Low Risk   . Difficulty of Paying Living Expenses: Not hard at all  Food Insecurity: No Food Insecurity  . Worried About Charity fundraiser in the Last Year: Never true  . Ran Out of Food in the Last Year: Never true  Transportation Needs: No Transportation Needs  . Lack of Transportation (Medical): No  . Lack of Transportation (Non-Medical): No  Physical Activity: Sufficiently Active  . Days of Exercise per Week: 6 days  . Minutes of Exercise per Session: 90 min  Stress: No Stress Concern Present  . Feeling of Stress : Not at all  Social Connections: Moderately Integrated  . Frequency of Communication with Friends and Family: More than three times a week  . Frequency of Social Gatherings with Friends and Family: Three times a week  . Attends Religious Services: 1 to 4 times per year  . Active Member of Clubs or Organizations: No  . Attends Archivist Meetings: Never  . Marital Status: Married    Tobacco Counseling Counseling given: Not Answered   Clinical Intake:  Pre-visit preparation completed: Yes  Pain : No/denies pain     BMI - recorded: 27.17 Nutritional Status: BMI 25 -29 Overweight Nutritional Risks: None Diabetes: No  How often do you need to have someone help you when you read instructions, pamphlets, or other written materials from your doctor or pharmacy?: 1 - Never  Diabetic?No  Interpreter Needed?: No  Information entered by :: Charlott Rakes, LPN   Activities of Daily Living In your present state of health, do you have any difficulty performing the following activities: 03/15/2020  Hearing? Y  Comment right ear  mild loss  Vision? N  Difficulty concentrating or making decisions? N  Walking or climbing stairs? N  Dressing or bathing? N  Doing errands, shopping? N  Preparing Food and eating ? N  Using the Toilet? N  In the past six months, have you accidently leaked urine? Y  Comment urgency at times  Do you have problems with loss of bowel control? N  Managing your Medications? N  Managing your Finances? N  Housekeeping or managing your Housekeeping? N  Some recent data might be hidden    Patient Care Team: Vivi Barrack, MD as PCP - General (Family Medicine) Ardis Hughs, MD as Attending Physician (Urology) Augustina Mood, DDS as Referring Physician (Dentistry) Deterding, Jeneen Rinks, MD as Consulting Physician (Nephrology) Leta Baptist, MD as Consulting Physician (Otolaryngology) Leandrew Koyanagi, MD as Attending Physician (Orthopedic Surgery)  Ronald Lobo, MD as Consulting Physician (Gastroenterology) Tyler Pita, MD as Consulting Physician (Radiation Oncology)  Indicate any recent Medical Services you may have received from other than [facility] providers in the past year (date may be approximate).     Assessment:   This is a routine wellness examination for Adam Gonzales.  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 Patient Stated     None at this time      Depression Screen PHQ 2/9 Scores 03/15/2020  PHQ - 2 Score 0    Fall Risk Fall Risk  03/15/2020  Falls in the past year? 0   Number falls in past yr: 0  Injury with Fall? 0  Follow up Falls prevention discussed    FALL RISK PREVENTION PERTAINING TO THE HOME:  Any stairs in or around the home? Yes  If so, are there any without handrails? Yes  Home free of loose throw rugs in walkways, pet beds, electrical cords, etc? Yes  Adequate lighting in your home to reduce risk of falls? Yes   ASSISTIVE DEVICES UTILIZED TO PREVENT FALLS:  Life alert? No  Use of a cane, walker or w/c? No  Grab bars in the bathroom? Yes  Shower chair or bench in shower? No  Elevated toilet seat or a handicapped toilet? Yes   TIMED UP AND GO:  Was the test performed? No .     Cognitive Function:     6CIT Screen 03/15/2020  What Year? 0 points  What month? 0 points  Count back from 20 0 points  Months in reverse 0 points  Repeat phrase 4 points    Immunizations Immunization History  Administered Date(s) Administered  . Fluad Quad(high Dose 65+) 01/14/2020  . Influenza, High Dose Seasonal PF 01/08/2014, 12/17/2014, 12/08/2015, 12/18/2017, 11/12/2018  . PFIZER(Purple Top)SARS-COV-2 Vaccination 03/29/2019, 04/22/2019, 12/20/2019  . Zoster Recombinat (Shingrix) 09/11/2017, 11/24/2017    TDAP status: Due, Education has been provided regarding the importance of this vaccine. Advised may receive this vaccine at local pharmacy or Health Dept. Aware to provide a copy of the vaccination record if obtained from local pharmacy or Health Dept. Verbalized acceptance and understanding.  Flu Vaccine status: Up to date done 01/14/20  Pneumococcal vaccine status: Due, Education has been provided regarding the importance of this vaccine. Advised may receive this vaccine at local pharmacy or Health Dept. Aware to provide a copy of the vaccination record if obtained from local pharmacy or Health Dept. Verbalized acceptance and understanding.  Covid-19 vaccine status: Completed vaccines  Qualifies for Shingles Vaccine? Yes   Zostavax  completed Yes   Shingrix Completed?: Yes  Screening Tests Health Maintenance  Topic Date Due  . Hepatitis C Screening  Never done  . TETANUS/TDAP  04/13/2020 (Originally 12/12/1964)  . PNA vac Low Risk Adult (1 of 2 - PCV13) 04/13/2020 (Originally 12/11/2012)  . COVID-19 Vaccine (4 - Booster for Pfizer series) 06/18/2020  . COLONOSCOPY (Pts 45-25yrs  Insurance coverage will need to be confirmed)  08/17/2021  . INFLUENZA VACCINE  Completed    Health Maintenance  Health Maintenance Due  Topic Date Due  . Hepatitis C Screening  Never done    Colorectal cancer screening: Type of screening: Colonoscopy. Completed 08/17/16. Repeat every 5 years  Additional Screening:  Hepatitis C Screening: does qualify  Vision Screening: Recommended annual ophthalmology exams for early detection of glaucoma and other disorders of the eye. Is the patient up to date with their annual eye exam?  No  bi annually  Who is the provider or what is the name of the office in which the patient attends annual eye exams? Unsure of providers name  Dental Screening: Recommended annual dental exams for proper oral hygiene  Community Resource Referral / Chronic Care Management: CRR required this visit?  No   CCM required this visit?  No      Plan:     I have personally reviewed and noted the following in the patient's chart:   . Medical and social history . Use of alcohol, tobacco or illicit drugs  . Current medications and supplements . Functional ability and status . Nutritional status . Physical activity . Advanced directives . List of other physicians . Hospitalizations, surgeries, and ER visits in previous 12 months . Vitals . Screenings to include cognitive, depression, and falls . Referrals and appointments  In addition, I have reviewed and discussed with patient certain preventive protocols, quality metrics, and best practice recommendations. A written personalized care plan for preventive  services as well as general preventive health recommendations were provided to patient.     Willette Brace, LPN   [id number]   Nurse Notes: None

## 2020-03-17 ENCOUNTER — Telehealth: Payer: Self-pay

## 2020-03-17 NOTE — Telephone Encounter (Signed)
Pt is wanting to know if he is needing or is behind on any of his immunizations.

## 2020-03-18 NOTE — Telephone Encounter (Signed)
Called and spoke with pt and he states he thinks he had the pneumonia shots, he will think about this and give [place] a call back and let us know what he decides to do.

## 2020-03-18 NOTE — Telephone Encounter (Signed)
Is pt needing any pneumonia vaccines?

## 2020-03-18 NOTE — Telephone Encounter (Signed)
I do not see any records of his pneumonia vaccines. He is otherwise up today. He can come in for pneumonia vaccine at any time if he wishes.  Adam Gonzales. [name], MD 03/18/2020 4:22 PM

## 2020-03-25 ENCOUNTER — Ambulatory Visit (INDEPENDENT_AMBULATORY_CARE_PROVIDER_SITE_OTHER): Payer: Medicare Other

## 2020-03-25 ENCOUNTER — Other Ambulatory Visit: Payer: Self-pay

## 2020-03-25 DIAGNOSIS — Z23 Encounter for immunization: Secondary | ICD-10-CM

## 2020-03-25 NOTE — Progress Notes (Signed)
Adam Gonzales 75 yr old male presents to office today for Prevnar 13 vaccine per Dimas Chyle, MD. Administered vaccine IM left arm. Patient tolerated well. Aware he will receive his next shot in one year.

## 2020-04-12 DIAGNOSIS — C61 Malignant neoplasm of prostate: Secondary | ICD-10-CM | POA: Diagnosis not present

## 2020-04-19 DIAGNOSIS — C61 Malignant neoplasm of prostate: Secondary | ICD-10-CM | POA: Diagnosis not present

## 2020-04-19 DIAGNOSIS — R3121 Asymptomatic microscopic hematuria: Secondary | ICD-10-CM | POA: Diagnosis not present

## 2020-06-29 ENCOUNTER — Other Ambulatory Visit: Payer: Self-pay

## 2020-06-29 ENCOUNTER — Encounter: Payer: Self-pay | Admitting: Family

## 2020-06-29 ENCOUNTER — Ambulatory Visit (INDEPENDENT_AMBULATORY_CARE_PROVIDER_SITE_OTHER): Payer: Medicare Other | Admitting: Family

## 2020-06-29 VITALS — BP 154/78 | HR 73 | Temp 98.2°F | Ht 69.5 in | Wt 194.6 lb

## 2020-06-29 DIAGNOSIS — K644 Residual hemorrhoidal skin tags: Secondary | ICD-10-CM | POA: Diagnosis not present

## 2020-06-29 NOTE — Progress Notes (Signed)
Acute Office Visit  Subjective:    Patient ID: Adam Gonzales, male    DOB: [birth year], 75 y.o.   MRN: [id number]  Chief Complaint  Patient presents with  . Constipation    Pt says that over the weekend he had a couple of painful bowel movements, due to eating starchy foods. He noticed blood in his underwear afterwards. He denies pain and itching.     HPI Patient is in today with complaints of having a couple painful bowel movements on Sunday that he reports were as a result of eating about a half a pound of cheese last week.  Reports having a hard, large bowel movement and having rectal pain and blood on the toilet paper after he defecated.  Today, his symptoms are better and he does not have any more pain and itching.  However, decided he would have it checked.  Reports his last colonoscopy was 4 years ago and is due to return in 5 to 7 years according to his gastroenterologist.  He did give him a call to confirm this information.  He denies ever having any episodes similar to this in the past.  Past Medical History:  Diagnosis Date  . 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 Gout   . Hypertension   . Prostate cancer ([place])   . Quadriceps tendon rupture, right, initial encounter     Past Surgical History:  Procedure Laterality Date  . CYST EXCISION    . HYDROCELE EXCISION Right   . PROSTATE BIOPSY    . QUADRICEPS TENDON REPAIR Right 01/14/2018   Procedure: REPAIR RIGHT QUADRICEPS TENDON;  Surgeon: Leandrew Koyanagi, MD;  Location: [facility];  Service: Orthopedics;  Laterality: Right;    Family History  Problem Relation Age of Onset  . Heart failure Mother   . Cancer - Colon Father   . Dementia Sister   . Throat cancer Brother   . Pancreatic cancer Neg Hx   . Breast cancer Neg Hx   . Prostate cancer Neg Hx     Social History   Socioeconomic History  . Marital status: Married    Spouse name: Not on file  . Number of children: Not on file  . Years of  education: Not on file  . Highest education level: Not on file  Occupational History  . Not on file  Tobacco Use  . Smoking status: Never Smoker  . Smokeless tobacco: Never Used  Vaping Use  . Vaping Use: Never used  Substance and Sexual Activity  . Alcohol use: Not Currently    Comment: social  . Drug use: No  . Sexual activity: Not Currently  Other Topics Concern  . Not on file  Social History Narrative  . Not on file   Social Determinants of Health   Financial Resource Strain: Low Risk   . Difficulty of Paying Living Expenses: Not hard at all  Food Insecurity: No Food Insecurity  . Worried About Charity fundraiser in the Last Year: Never true  . Ran Out of Food in the Last Year: Never true  Transportation Needs: No Transportation Needs  . Lack of Transportation (Medical): No  . Lack of Transportation (Non-Medical): No  Physical Activity: Sufficiently Active  . Days of Exercise per Week: 6 days  . Minutes of Exercise per Session: 90 min  Stress: No Stress Concern Present  . Feeling of Stress : Not at all  Social Connections: Moderately Integrated  . Frequency of  Communication with Friends and Family: More than three times a week  . Frequency of Social Gatherings with Friends and Family: Three times a week  . Attends Religious Services: 1 to 4 times per year  . Active Member of Clubs or Organizations: No  . Attends Archivist Meetings: Never  . Marital Status: Married  Human resources officer Violence: Not At Risk  . Fear of Current or Ex-Partner: No  . Emotionally Abused: No  . Physically Abused: No  . Sexually Abused: No    Outpatient Medications Prior to Visit  Medication Sig Dispense Refill  . aspirin EC 81 MG tablet Take 81 mg by mouth daily.    . finasteride (PROSCAR) 5 MG tablet Take 5 mg by mouth daily.  10  . hydrochlorothiazide (HYDRODIURIL) 12.5 MG tablet Take 12.5 mg by mouth daily.    Marland Kitchen lisinopril (PRINIVIL,ZESTRIL) 40 MG tablet Take 40 mg by  mouth daily.  2  . meloxicam (MOBIC) 15 MG tablet Take 15 mg by mouth daily.    Marland Kitchen spironolactone (ALDACTONE) 25 MG tablet Take 25 mg by mouth daily.    . Tamsulosin HCl (FLOMAX) 0.4 MG CAPS Take 0.4 mg by mouth daily.      No facility-administered medications prior to visit.    No Known Allergies  Review of Systems  Constitutional: Negative.   Respiratory: Negative.   Cardiovascular: Negative.   Gastrointestinal: Positive for blood in stool and constipation. Negative for abdominal pain, diarrhea and rectal pain.       Rectal itching  Musculoskeletal: Negative.   Skin: Negative.   Allergic/Immunologic: Negative.   Neurological: Negative.   Psychiatric/Behavioral: Negative.        Objective:    Physical Exam Vitals and nursing note reviewed. Exam conducted with a chaperone present.  Constitutional:      Appearance: Normal appearance.  Cardiovascular:     Rate and Rhythm: Normal rate and regular rhythm.  Pulmonary:     Effort: Pulmonary effort is normal.     Breath sounds: Normal breath sounds.  Abdominal:     General: Abdomen is flat. Bowel sounds are normal.     Palpations: Abdomen is soft.     Tenderness: There is no guarding.  Genitourinary:    Rectum: Guaiac result negative.     Comments: External hemorrhoid tag with mild inflammation noted.  Nontender.  No active bleeding. Musculoskeletal:        General: Normal range of motion.     Cervical back: Normal range of motion and neck supple.  Skin:    General: Skin is warm and dry.  Neurological:     General: No focal deficit present.     Mental Status: He is alert and oriented to person, place, and time.  Psychiatric:        Mood and Affect: Mood normal.        Behavior: Behavior normal.     BP (!) 154/78   Pulse 73   Temp 98.2 F (36.8 C) (Temporal)   Ht 5' 9.5" (1.765 m)   Wt 194 lb 9.6 oz (88.3 kg)   SpO2 97%   BMI 28.33 kg/m  Wt Readings from Last 3 Encounters:  06/29/20 194 lb 9.6 oz (88.3 kg)   09/30/19 186 lb 9.6 oz (84.6 kg)  04/14/19 189 lb 3.2 oz (85.8 kg)    Health Maintenance Due  Topic Date Due  . Hepatitis C Screening  Never done  . TETANUS/TDAP  Never done  There are no preventive care reminders to display for this patient.   Lab Results  Component Value Date   TSH 2.59 09/30/2019   Lab Results  Component Value Date   WBC 6.4 09/30/2019   HGB 14.2 09/30/2019   HCT 41.6 09/30/2019   MCV 91.4 09/30/2019   PLT 235 09/30/2019   Lab Results  Component Value Date   NA 137 09/30/2019   K 3.7 09/30/2019   CO2 27 09/30/2019   GLUCOSE 106 (H) 09/30/2019   BUN 21 09/30/2019   CREATININE 1.12 09/30/2019   BILITOT 0.8 09/30/2019   AST 28 09/30/2019   ALT 21 09/30/2019   PROT 6.6 09/30/2019   CALCIUM 9.7 09/30/2019   ANIONGAP 11 01/05/2018   Lab Results  Component Value Date   CHOL 156 09/30/2019   Lab Results  Component Value Date   HDL 59 09/30/2019   Lab Results  Component Value Date   LDLCALC 77 09/30/2019   Lab Results  Component Value Date   TRIG 117 09/30/2019   Lab Results  Component Value Date   CHOLHDL 2.6 09/30/2019   Lab Results  Component Value Date   HGBA1C 6.2 (H) 09/30/2019       Assessment & Plan:   Problem List Items Addressed This Visit   None   Visit Diagnoses    External hemorrhoids without complication    -  Primary      Bleeding highly likely from the external hemorrhoid tag that appears to be healing well.  Offered hydrocortisone suppository but patient declines at current.  Follow-up with gastroenterologist to ensure that he gets and for colonoscopy screening as they suggest.  Call the office if symptoms return, worsen or persist. No orders of the defined types were placed in this encounter.    Kennyth Arnold, FNP

## 2020-06-29 NOTE — Patient Instructions (Signed)
Hemorrhoids Hemorrhoids are swollen veins that may develop:  In the butt (rectum). These are called internal hemorrhoids.  Around the opening of the butt (anus). These are called external hemorrhoids. Hemorrhoids can cause pain, itching, or bleeding. Most of the time, they do not cause serious problems. They usually get better with diet changes, lifestyle changes, and other home treatments. What are the causes? This condition may be caused by:  Having trouble pooping (constipation).  Pushing hard (straining) to poop.  Watery poop (diarrhea).  Pregnancy.  Being very overweight (obese).  Sitting for long periods of time.  Heavy lifting or other activity that causes you to strain.  Anal sex.  Riding a bike for a long period of time. What are the signs or symptoms? Symptoms of this condition include:  Pain.  Itching or soreness in the butt.  Bleeding from the butt.  Leaking poop.  Swelling in the area.  One or more lumps around the opening of your butt. How is this diagnosed? A doctor can often diagnose this condition by looking at the affected area. The doctor may also:  Do an exam that involves feeling the area with a gloved hand (digital rectal exam).  Examine the area inside your butt using a small tube (anoscope).  Order blood tests. This may be done if you have lost a lot of blood.  Have you get a test that involves looking inside the colon using a flexible tube with a camera on the end (sigmoidoscopy or colonoscopy). How is this treated? This condition can usually be treated at home. Your doctor may tell you to change what you eat, make lifestyle changes, or try home treatments. If these do not help, procedures can be done to remove the hemorrhoids or make them smaller. These may involve:  Placing rubber bands at the base of the hemorrhoids to cut off their blood supply.  Injecting medicine into the hemorrhoids to shrink them.  Shining a type of light  energy onto the hemorrhoids to cause them to fall off.  Doing surgery to remove the hemorrhoids or cut off their blood supply. Follow these instructions at home: Eating and drinking  Eat foods that have a lot of fiber in them. These include whole grains, beans, nuts, fruits, and vegetables.  Ask your doctor about taking products that have added fiber (fibersupplements).  Reduce the amount of fat in your diet. You can do this by: ? Eating low-fat dairy products. ? Eating less red meat. ? Avoiding processed foods.  Drink enough fluid to keep your pee (urine) pale yellow.   Managing pain and swelling  Take a warm-water bath (sitz bath) for 20 minutes to ease pain. Do this 3-4 times a day. You may do this in a bathtub or using a portable sitz bath that fits over the toilet.  If told, put ice on the painful area. It may be helpful to use ice between your warm baths. ? Put ice in a plastic bag. ? Place a towel between your skin and the bag. ? Leave the ice on for 20 minutes, 2-3 times a day.   General instructions  Take over-the-counter and prescription medicines only as told by your doctor. ? Medicated creams and medicines may be used as told.  Exercise often. Ask your doctor how much and what kind of exercise is best for you.  Go to the bathroom when you have the urge to poop. Do not wait.  Avoid pushing too hard when you poop.  Keep your butt dry and clean. Use wet toilet paper or moist towelettes after pooping.  Do not sit on the toilet for a long time.  Keep all follow-up visits as told by your doctor. This is important. Contact a doctor if you:  Have pain and swelling that do not get better with treatment or medicine.  Have trouble pooping.  Cannot poop.  Have pain or swelling outside the area of the hemorrhoids. Get help right away if you have:  Bleeding that will not stop. Summary  Hemorrhoids are swollen veins in the butt or around the opening of the  butt.  They can cause pain, itching, or bleeding.  Eat foods that have a lot of fiber in them. These include whole grains, beans, nuts, fruits, and vegetables.  Take a warm-water bath (sitz bath) for 20 minutes to ease pain. Do this 3-4 times a day. This information is not intended to replace advice given to you by your health care provider. Make sure you discuss any questions you have with your health care provider. Document Revised: 01/31/2018 Document Reviewed: 06/14/2017 Elsevier Patient Education  [place].

## 2020-07-22 ENCOUNTER — Encounter: Payer: Self-pay | Admitting: Family Medicine

## 2020-09-14 ENCOUNTER — Encounter: Payer: Self-pay | Admitting: Family Medicine

## 2020-09-14 ENCOUNTER — Other Ambulatory Visit: Payer: Self-pay

## 2020-09-14 ENCOUNTER — Ambulatory Visit (INDEPENDENT_AMBULATORY_CARE_PROVIDER_SITE_OTHER): Payer: Medicare Other | Admitting: Family Medicine

## 2020-09-14 VITALS — BP 155/78 | HR 69 | Temp 97.7°F | Ht 69.5 in | Wt 190.2 lb

## 2020-09-14 DIAGNOSIS — C61 Malignant neoplasm of prostate: Secondary | ICD-10-CM | POA: Diagnosis not present

## 2020-09-14 DIAGNOSIS — Z13220 Encounter for screening for lipoid disorders: Secondary | ICD-10-CM | POA: Diagnosis not present

## 2020-09-14 DIAGNOSIS — R739 Hyperglycemia, unspecified: Secondary | ICD-10-CM

## 2020-09-14 DIAGNOSIS — Z0001 Encounter for general adult medical examination with abnormal findings: Secondary | ICD-10-CM

## 2020-09-14 DIAGNOSIS — N182 Chronic kidney disease, stage 2 (mild): Secondary | ICD-10-CM

## 2020-09-14 DIAGNOSIS — I10 Essential (primary) hypertension: Secondary | ICD-10-CM

## 2020-09-14 DIAGNOSIS — B351 Tinea unguium: Secondary | ICD-10-CM

## 2020-09-14 DIAGNOSIS — L570 Actinic keratosis: Secondary | ICD-10-CM

## 2020-09-14 LAB — HEMOGLOBIN A1C: Hgb A1c MFr Bld: 6.7 % — ABNORMAL HIGH (ref 4.6–6.5)

## 2020-09-14 LAB — COMPREHENSIVE METABOLIC PANEL
ALT: 19 U/L (ref 0–53)
AST: 30 U/L (ref 0–37)
Albumin: 4.1 g/dL (ref 3.5–5.2)
Alkaline Phosphatase: 58 U/L (ref 39–117)
BUN: 20 mg/dL (ref 6–23)
CO2: 25 mEq/L (ref 19–32)
Calcium: 9.9 mg/dL (ref 8.4–10.5)
Chloride: 102 mEq/L (ref 96–112)
Creatinine, Ser: 1.05 mg/dL (ref 0.40–1.50)
GFR: 70.79 mL/min (ref >60.00)
Glucose, Bld: 131 mg/dL — ABNORMAL HIGH (ref 70–99)
Potassium: 3.3 mEq/L — ABNORMAL LOW (ref 3.5–5.1)
Sodium: 137 mEq/L (ref 135–145)
Total Bilirubin: 1 mg/dL (ref 0.2–1.2)
Total Protein: 7 g/dL (ref 6.0–8.3)

## 2020-09-14 LAB — CBC
HCT: 41.8 % (ref 39.0–52.0)
Hemoglobin: 14 g/dL (ref 13.0–17.0)
MCHC: 33.6 g/dL (ref 30.0–36.0)
MCV: 90 fl (ref 78.0–100.0)
Platelets: 261 10*3/uL (ref 150.0–400.0)
RBC: 4.64 Mil/uL (ref 4.22–5.81)
RDW: 14.9 % (ref 11.5–15.5)
WBC: 6.5 10*3/uL (ref 4.0–10.5)

## 2020-09-14 LAB — TSH: TSH: 3.63 u[IU]/mL (ref 0.35–5.50)

## 2020-09-14 LAB — LIPID PANEL
Cholesterol: 169 mg/dL (ref 0–200)
HDL: 54.3 mg/dL (ref >39.00)
LDL Cholesterol: 91 mg/dL (ref 0–99)
NonHDL: 114.21
Total CHOL/HDL Ratio: 3
Triglycerides: 118 mg/dL (ref 0.0–149.0)
VLDL: 23.6 mg/dL (ref 0.0–40.0)

## 2020-09-14 NOTE — Assessment & Plan Note (Signed)
Check c-Met. 

## 2020-09-14 NOTE — Assessment & Plan Note (Signed)
Continue management per urology. 

## 2020-09-14 NOTE — Assessment & Plan Note (Signed)
Elevated today though typically in the 120s to 130s at home.  We will continue current regimen of HCTZ 12.5 mg daily, lisinopril 40 mg daily, and spironolactone 25 mg daily.  Check labs today.

## 2020-09-14 NOTE — Assessment & Plan Note (Signed)
Discussed cryotherapy today.  Discussed with patient this may develop into skin cancer.  He elected to continue with watchful waiting for now.

## 2020-09-14 NOTE — Progress Notes (Signed)
Chief Complaint:  Adam Gonzales is a 75 y.o. male who presents today for his annual comprehensive physical exam.    Assessment/Plan:  New/Acute Problems: Onychomycosis He does not want terbinafine at this point.  He will try home remedies.  Chronic Problems Addressed Today: Actinic keratosis Discussed cryotherapy today.  Discussed with patient this may develop into skin cancer.  He elected to continue with watchful waiting for now.  Hyperglycemia Check A1c.  Malignant neoplasm of prostate [facility]) Continue management per urology.  HTN (hypertension) Elevated today though typically in the 120s to 130s at home.  We will continue current regimen of HCTZ 12.5 mg daily, lisinopril 40 mg daily, and spironolactone 25 mg daily.  Check labs today.  CKD (chronic kidney disease) stage 2, GFR 60-89 ml/min Check c-Met.  Preventative Healthcare: UTD on vaccines. Due for colonoscopy this year. Check labs.   Patient Counseling(The following topics were reviewed and/or handout was given):  -Nutrition: Stressed importance of moderation in sodium/caffeine intake, saturated fat and cholesterol, caloric balance, sufficient intake of fresh fruits, vegetables, and fiber.  -Stressed the importance of regular exercise.   -Substance Abuse: Discussed cessation/primary prevention of tobacco, alcohol, or other drug use; driving or other dangerous activities under the influence; availability of treatment for abuse.   -Injury prevention: Discussed safety belts, safety helmets, smoke detector, smoking near bedding or upholstery.   -Sexuality: Discussed sexually transmitted diseases, partner selection, use of condoms, avoidance of unintended pregnancy and contraceptive alternatives.   -Dental health: Discussed importance of regular tooth brushing, flossing, and dental visits.  -Health maintenance and immunizations reviewed. Please refer to Health maintenance section.  Return to care in 1 year for next  preventative visit.     Subjective:  HPI:  He has no acute complaints today.   Lifestyle Diet: Balanced. Exercise: He goes to gym 6-7 days a week.   Depression screen PHQ 2/9 09/14/2020  Decreased Interest 0  Down, Depressed, Hopeless 0  PHQ - 2 Score 0    Health Maintenance Due  Topic Date Due   Hepatitis C Screening  Never done   TETANUS/TDAP  Never done   COVID-19 Vaccine (4 - Booster for Pfizer series) 03/21/2020   INFLUENZA VACCINE  09/06/2020     ROS: Per HPI, otherwise a complete review of systems was negative.   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   Past Surgical History:  Procedure Laterality Date   CYST EXCISION     HYDROCELE EXCISION Right    PROSTATE BIOPSY     QUADRICEPS TENDON REPAIR Right 01/14/2018   Procedure: REPAIR RIGHT QUADRICEPS TENDON;  Surgeon: Leandrew Koyanagi, MD;  Location: [facility];  Service: Orthopedics;  Laterality: Right;    Family History  Problem Relation Age of Onset   Heart failure Mother    Cancer - Colon Father    Dementia Sister    Throat cancer Brother    Pancreatic cancer Neg Hx    Breast  cancer Neg Hx    Prostate cancer Neg Hx     Medications- reviewed and updated Current Outpatient Medications  Medication Sig Dispense Refill   aspirin EC 81 MG tablet Take 81 mg by mouth daily.     colchicine 0.6 MG tablet Take 0.6 mg by mouth as needed.     hydrochlorothiazide (HYDRODIURIL) 12.5 MG tablet Take 12.5  mg by mouth daily.     lisinopril (PRINIVIL,ZESTRIL) 40 MG tablet Take 40 mg by mouth daily.  2   spironolactone (ALDACTONE) 25 MG tablet Take 25 mg by mouth daily.     Tamsulosin HCl (FLOMAX) 0.4 MG CAPS Take 0.4 mg by mouth daily.      No current facility-administered medications for this visit.    Allergies-reviewed and updated No Known Allergies  Social History   Socioeconomic History   Marital status: Married    Spouse name: Not on file   Number of children: Not on file   Years of education: Not on file   Highest education level: Not on file  Occupational History   Not on file  Tobacco Use   Smoking status: Never   Smokeless tobacco: Never  Vaping Use   Vaping Use: Never used  Substance and Sexual Activity   Alcohol use: Not Currently    Comment: social   Drug use: No   Sexual activity: Not Currently  Other Topics Concern   Not on file  Social History Narrative   Not on file   Social Determinants of Health   Financial Resource Strain: Low Risk    Difficulty of Paying Living Expenses: Not hard at all  Food Insecurity: No Food Insecurity   Worried About Charity fundraiser in the Last Year: Never true   [place] in the Last Year: Never true  Transportation Needs: No Transportation Needs   Lack of Transportation (Medical): No   Lack of Transportation (Non-Medical): No  Physical Activity: Sufficiently Active   Days of Exercise per Week: 6 days   Minutes of Exercise per Session: 90 min  Stress: No Stress Concern Present   Feeling of Stress : Not at all  Social Connections: Moderately Integrated   Frequency of Communication with Friends and Family: More than three times a week   Frequency of Social Gatherings with Friends and Family: Three times a week   Attends Religious Services: 1 to 4 times per year   Active Member of Clubs or Organizations: No   Attends Music therapist: Never   Marital Status: Married        Objective:  Physical  Exam: BP (!) 155/78   Pulse 69   Temp 97.7 F (36.5 C) (Temporal)   Ht 5' 9.5" (1.765 m)   Wt 190 lb 3.2 oz (86.3 kg)   SpO2 96%   BMI 27.68 kg/m   Body mass index is 27.68 kg/m. Wt Readings from Last 3 Encounters:  09/14/20 190 lb 3.2 oz (86.3 kg)  06/29/20 194 lb 9.6 oz (88.3 kg)  09/30/19 186 lb 9.6 oz (84.6 kg)   Gen: NAD, resting comfortably HEENT: TMs normal bilaterally. OP clear. No thyromegaly noted.  CV: RRR with 2 out of 6 systolic murmur appreciated Pulm: NWOB, CTAB with no crackles, wheezes, or rhonchi GI: Normal bowel sounds present. Soft, Nontender, Nondistended. GU: Normal male genitalia.  No testicular lumps or masses.  No inguinal hernias. MSK: no edema, cyanosis, or clubbing noted Skin: warm, dry.  Few scattered actinic keratosis  on upper extremities and lower extremities.  Left great toe onychomycosis. Neuro: CN2-12 grossly intact. Strength 5/5 in upper and lower extremities. Reflexes symmetric and intact bilaterally.  Psych: Normal affect and thought content      I,Savera Zaman,acting as a scribe for Dimas Chyle, MD.,have documented all relevant documentation on the behalf of Dimas Chyle, MD,as directed by  Dimas Chyle, MD while in the presence of Dimas Chyle, MD.  I, Dimas Chyle, MD, have reviewed all documentation for this visit. The documentation on 09/14/20 for the exam, diagnosis, procedures, and orders are all accurate and complete.  Algis Greenhouse. [name], MD 09/14/2020 9:19 AM

## 2020-09-14 NOTE — Patient Instructions (Signed)
It was very nice to see you today!  We will check blood work today.  Please keep up the good work with your diet and exercise.  I will see back in year.  Come back to see me sooner if needed.  Take care, Dr [name]  PLEASE NOTE:  If you had any lab tests please let us know if you have not heard back within a few days. You may see your results on mychart before we have a chance to review them but we will give you a call once they are reviewed by [place]. If we ordered any referrals today, please let us know if you have not heard from their office within the next week.   Please try these tips to maintain a healthy lifestyle:  Eat at least 3 REAL meals and 1-2 snacks per day.  Aim for no more than 5 hours between eating.  If you eat breakfast, please do so within one hour of getting up.   Each meal should contain half fruits/vegetables, one quarter protein, and one quarter carbs (no bigger than a computer mouse)  Cut down on sweet beverages. This includes juice, soda, and sweet tea.   Drink at least 1 glass of water with each meal and aim for at least 8 glasses per day  Exercise at least 150 minutes every week.    Preventive Care 9 Years and Older, Male Preventive care refers to lifestyle choices and visits with your health care provider that can promote health and wellness. This includes: A yearly physical exam. This is also called an annual wellness visit. Regular dental and eye exams. Immunizations. Screening for certain conditions. Healthy lifestyle choices, such as: Eating a healthy diet. Getting regular exercise. Not using drugs or products that contain nicotine and tobacco. Limiting alcohol use. What can I expect for my preventive care visit? Physical exam Your health care provider will check your: Height and weight. These may be used to calculate your BMI (body mass index). BMI is a measurement that tells if you are at a healthy weight. Heart rate and blood pressure. Body  temperature. Skin for abnormal spots. Counseling Your health care provider may ask you questions about your: Past medical problems. Family's medical history. Alcohol, tobacco, and drug use. Emotional well-being. Home life and relationship well-being. Sexual activity. Diet, exercise, and sleep habits. History of falls. Memory and ability to understand (cognition). Work and work Statistician. Access to firearms. What immunizations do I need?  Vaccines are usually given at various ages, according to a schedule. Your health care provider will recommend vaccines for you based on your age, medicalhistory, and lifestyle or other factors, such as travel or where you work. What tests do I need? Blood tests Lipid and cholesterol levels. These may be checked every 5 years, or more often depending on your overall health. Hepatitis C test. Hepatitis B test. Screening Lung cancer screening. You may have this screening every year starting at age 105 if you have a 30-pack-year history of smoking and currently smoke or have quit within the past 15 years. Colorectal cancer screening. All adults should have this screening starting at age 12 and continuing until age 57. Your health care provider may recommend screening at age 20 if you are at increased risk. You will have tests every 1-10 years, depending on your results and the type of screening test. Prostate cancer screening. Recommendations will vary depending on your family history and other risks. Genital exam to check for testicular cancer  or hernias. Diabetes screening. This is done by checking your blood sugar (glucose) after you have not eaten for a while (fasting). You may have this done every 1-3 years. Abdominal aortic aneurysm (AAA) screening. You may need this if you are a current or former smoker. STD (sexually transmitted disease) testing, if you are at risk. Follow these instructions at home: Eating and drinking  Eat a diet that  includes fresh fruits and vegetables, whole grains, lean protein, and low-fat dairy products. Limit your intake of foods with high amounts of sugar, saturated fats, and salt. Take vitamin and mineral supplements as recommended by your health care provider. Do not drink alcohol if your health care provider tells you not to drink. If you drink alcohol: Limit how much you have to 0-2 drinks a day. Be aware of how much alcohol is in your drink. In the [place] one drink equals one 12 oz bottle of beer (355 mL), one 5 oz glass of wine (148 mL), or one 1 oz glass of hard liquor (44 mL).  Lifestyle Take daily care of your teeth and gums. Brush your teeth every morning and night with fluoride toothpaste. Floss one time each day. Stay active. Exercise for at least 30 minutes 5 or more days each week. Do not use any products that contain nicotine or tobacco, such as cigarettes, e-cigarettes, and chewing tobacco. If you need help quitting, ask your health care provider. Do not use drugs. If you are sexually active, practice safe sex. Use a condom or other form of protection to prevent STIs (sexually transmitted infections). Talk with your health care provider about taking a low-dose aspirin or statin. Find healthy ways to cope with stress, such as: Meditation, yoga, or listening to music. Journaling. Talking to a trusted person. Spending time with friends and family. Safety Always wear your seat belt while driving or riding in a vehicle. Do not drive: If you have been drinking alcohol. Do not ride with someone who has been drinking. When you are tired or distracted. While texting. Wear a helmet and other protective equipment during sports activities. If you have firearms in your house, make sure you follow all gun safety procedures. What's next? Visit your health care provider once a year for an annual wellness visit. Ask your health care provider how often you should have your eyes and teeth  checked. Stay up to date on all vaccines. This information is not intended to replace advice given to you by your health care provider. Make sure you discuss any questions you have with your healthcare provider. Document Revised: 10/22/2018 Document Reviewed: 01/17/2018 Elsevier Patient Education  2022 [employer].

## 2020-09-14 NOTE — Assessment & Plan Note (Signed)
Check A1c. 

## 2020-09-15 NOTE — Progress Notes (Signed)
Please inform patient of the following:  His A1c is elevated into the diabetic range.  We do not need to start any medications however he may benefit from starting metformin 500 mg once daily.  He should continue working on diet and exercise.  Please send prescription if he is willing to take.  We should recheck his A1c in 3 to 6 months.  Potassium is also a bit low.  Please have him come back in 1 to 2 weeks to recheck BMET.  Everything else is normal can be rechecked in year.

## 2020-09-17 ENCOUNTER — Other Ambulatory Visit: Payer: Self-pay

## 2020-09-17 ENCOUNTER — Telehealth: Payer: Self-pay

## 2020-09-17 DIAGNOSIS — E876 Hypokalemia: Secondary | ICD-10-CM

## 2020-09-17 NOTE — Telephone Encounter (Signed)
Returned call to pt and labs reviewed.

## 2020-09-17 NOTE — Telephone Encounter (Signed)
Pt called about lab results. Please Advise

## 2020-09-27 ENCOUNTER — Other Ambulatory Visit (INDEPENDENT_AMBULATORY_CARE_PROVIDER_SITE_OTHER): Payer: Medicare Other

## 2020-09-27 ENCOUNTER — Other Ambulatory Visit: Payer: Self-pay

## 2020-09-27 DIAGNOSIS — E876 Hypokalemia: Secondary | ICD-10-CM | POA: Diagnosis not present

## 2020-09-28 ENCOUNTER — Telehealth: Payer: Self-pay

## 2020-09-28 LAB — BASIC METABOLIC PANEL
BUN: 33 mg/dL — ABNORMAL HIGH (ref 6–23)
CO2: 22 mEq/L (ref 19–32)
Calcium: 10.1 mg/dL (ref 8.4–10.5)
Chloride: 101 mEq/L (ref 96–112)
Creatinine, Ser: 1.38 mg/dL (ref 0.40–1.50)
GFR: 50.98 mL/min — ABNORMAL LOW (ref >60.00)
Glucose, Bld: 95 mg/dL (ref 70–99)
Potassium: 3.3 mEq/L — ABNORMAL LOW (ref 3.5–5.1)
Sodium: 135 mEq/L (ref 135–145)

## 2020-09-28 NOTE — Telephone Encounter (Signed)
Patient has called back to receive results from recent labs.  I have explained to patient that Dr. [name] needs to review labs.   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

## 2020-09-29 NOTE — Progress Notes (Signed)
Please inform patient of the following:  His potassium level is the same but it looks like he his dehydrated based on his most recent labs. We probably should stop his HCTZ - this can cause both low potassium and dehydration.  I would like for him to come back in 1-2 weeks for a blood pressure check and to recheck his blood work.  Adam Gonzales. [name], MD 09/29/2020 8:02 AM

## 2020-09-29 NOTE — Telephone Encounter (Signed)
I have sent mychart message per patient request

## 2020-09-30 ENCOUNTER — Encounter: Payer: Medicare Other | Admitting: Family Medicine

## 2020-10-05 DIAGNOSIS — Z86010 Personal history of colonic polyps: Secondary | ICD-10-CM | POA: Diagnosis not present

## 2020-10-05 DIAGNOSIS — K625 Hemorrhage of anus and rectum: Secondary | ICD-10-CM | POA: Diagnosis not present

## 2020-10-05 DIAGNOSIS — R195 Other fecal abnormalities: Secondary | ICD-10-CM | POA: Diagnosis not present

## 2020-10-05 DIAGNOSIS — Z800 Family history of malignant neoplasm of digestive organs: Secondary | ICD-10-CM | POA: Diagnosis not present

## 2020-10-27 DIAGNOSIS — C61 Malignant neoplasm of prostate: Secondary | ICD-10-CM | POA: Diagnosis not present

## 2020-10-28 DIAGNOSIS — Z800 Family history of malignant neoplasm of digestive organs: Secondary | ICD-10-CM | POA: Diagnosis not present

## 2020-10-28 DIAGNOSIS — K648 Other hemorrhoids: Secondary | ICD-10-CM | POA: Diagnosis not present

## 2020-10-28 DIAGNOSIS — K5520 Angiodysplasia of colon without hemorrhage: Secondary | ICD-10-CM | POA: Diagnosis not present

## 2020-10-28 DIAGNOSIS — Z86010 Personal history of colonic polyps: Secondary | ICD-10-CM | POA: Diagnosis not present

## 2020-10-28 LAB — HM COLONOSCOPY

## 2020-11-01 DIAGNOSIS — Z8546 Personal history of malignant neoplasm of prostate: Secondary | ICD-10-CM | POA: Diagnosis not present

## 2020-11-30 ENCOUNTER — Telehealth: Payer: Self-pay

## 2020-11-30 NOTE — Telephone Encounter (Signed)
Patient called in stating he woke up having nausea, dizziness, headache and not able to eat due to nausea.  All of our offices are full for today.  I have given patient a few other options for a visit today.  Patient stated he had a COVID home test.  States he would go ahead with testing.  Also, stated he would schedule a my chart UC virtual visit.    Please follow up with patient.

## 2020-12-02 ENCOUNTER — Telehealth (INDEPENDENT_AMBULATORY_CARE_PROVIDER_SITE_OTHER): Payer: Medicare Other | Admitting: Family Medicine

## 2020-12-02 ENCOUNTER — Other Ambulatory Visit: Payer: Self-pay

## 2020-12-02 ENCOUNTER — Emergency Department (HOSPITAL_BASED_OUTPATIENT_CLINIC_OR_DEPARTMENT_OTHER): Payer: Medicare Other

## 2020-12-02 ENCOUNTER — Encounter (HOSPITAL_BASED_OUTPATIENT_CLINIC_OR_DEPARTMENT_OTHER): Payer: Self-pay | Admitting: Emergency Medicine

## 2020-12-02 ENCOUNTER — Observation Stay (HOSPITAL_BASED_OUTPATIENT_CLINIC_OR_DEPARTMENT_OTHER)
Admission: EM | Admit: 2020-12-02 | Discharge: 2020-12-04 | Disposition: A | Discharge status: Home / Self Care | Payer: Medicare Other | Attending: Internal Medicine | Admitting: Internal Medicine

## 2020-12-02 DIAGNOSIS — R519 Headache, unspecified: Secondary | ICD-10-CM

## 2020-12-02 DIAGNOSIS — Z8546 Personal history of malignant neoplasm of prostate: Secondary | ICD-10-CM | POA: Diagnosis not present

## 2020-12-02 DIAGNOSIS — Y9239 Other specified sports and athletic area as the place of occurrence of the external cause: Secondary | ICD-10-CM | POA: Insufficient documentation

## 2020-12-02 DIAGNOSIS — N182 Chronic kidney disease, stage 2 (mild): Secondary | ICD-10-CM | POA: Diagnosis not present

## 2020-12-02 DIAGNOSIS — E876 Hypokalemia: Secondary | ICD-10-CM | POA: Insufficient documentation

## 2020-12-02 DIAGNOSIS — R29898 Other symptoms and signs involving the musculoskeletal system: Secondary | ICD-10-CM | POA: Diagnosis not present

## 2020-12-02 DIAGNOSIS — Z20822 Contact with and (suspected) exposure to covid-19: Secondary | ICD-10-CM | POA: Diagnosis not present

## 2020-12-02 DIAGNOSIS — I629 Nontraumatic intracranial hemorrhage, unspecified: Secondary | ICD-10-CM

## 2020-12-02 DIAGNOSIS — G319 Degenerative disease of nervous system, unspecified: Secondary | ICD-10-CM | POA: Diagnosis not present

## 2020-12-02 DIAGNOSIS — I10 Essential (primary) hypertension: Secondary | ICD-10-CM | POA: Diagnosis not present

## 2020-12-02 DIAGNOSIS — H532 Diplopia: Secondary | ICD-10-CM

## 2020-12-02 DIAGNOSIS — M6281 Muscle weakness (generalized): Secondary | ICD-10-CM | POA: Insufficient documentation

## 2020-12-02 DIAGNOSIS — I609 Nontraumatic subarachnoid hemorrhage, unspecified: Secondary | ICD-10-CM

## 2020-12-02 DIAGNOSIS — S06360A Traumatic hemorrhage of cerebrum, unspecified, without loss of consciousness, initial encounter: Principal | ICD-10-CM | POA: Insufficient documentation

## 2020-12-02 DIAGNOSIS — Y93B9 Activity, other involving muscle strengthening exercises: Secondary | ICD-10-CM | POA: Insufficient documentation

## 2020-12-02 DIAGNOSIS — Z79899 Other long term (current) drug therapy: Secondary | ICD-10-CM | POA: Diagnosis not present

## 2020-12-02 DIAGNOSIS — I6522 Occlusion and stenosis of left carotid artery: Secondary | ICD-10-CM | POA: Diagnosis not present

## 2020-12-02 DIAGNOSIS — I618 Other nontraumatic intracerebral hemorrhage: Secondary | ICD-10-CM | POA: Diagnosis not present

## 2020-12-02 DIAGNOSIS — W228XXA Striking against or struck by other objects, initial encounter: Secondary | ICD-10-CM | POA: Insufficient documentation

## 2020-12-02 DIAGNOSIS — N400 Enlarged prostate without lower urinary tract symptoms: Secondary | ICD-10-CM | POA: Clinically undetermined

## 2020-12-02 DIAGNOSIS — N179 Acute kidney failure, unspecified: Secondary | ICD-10-CM | POA: Insufficient documentation

## 2020-12-02 DIAGNOSIS — I6502 Occlusion and stenosis of left vertebral artery: Secondary | ICD-10-CM | POA: Diagnosis not present

## 2020-12-02 DIAGNOSIS — R269 Unspecified abnormalities of gait and mobility: Secondary | ICD-10-CM | POA: Diagnosis not present

## 2020-12-02 DIAGNOSIS — Z7982 Long term (current) use of aspirin: Secondary | ICD-10-CM | POA: Insufficient documentation

## 2020-12-02 DIAGNOSIS — I129 Hypertensive chronic kidney disease with stage 1 through stage 4 chronic kidney disease, or unspecified chronic kidney disease: Secondary | ICD-10-CM | POA: Insufficient documentation

## 2020-12-02 DIAGNOSIS — G936 Cerebral edema: Secondary | ICD-10-CM | POA: Diagnosis not present

## 2020-12-02 LAB — COMPREHENSIVE METABOLIC PANEL
ALT: 21 U/L (ref 0–44)
AST: 36 U/L (ref 15–41)
Albumin: 4.3 g/dL (ref 3.5–5.0)
Alkaline Phosphatase: 53 U/L (ref 38–126)
Anion gap: 13 (ref 5–15)
BUN: 48 mg/dL — ABNORMAL HIGH (ref 8–23)
CO2: 21 mmol/L — ABNORMAL LOW (ref 22–32)
Calcium: 9.6 mg/dL (ref 8.9–10.3)
Chloride: 103 mmol/L (ref 98–111)
Creatinine, Ser: 2.2 mg/dL — ABNORMAL HIGH (ref 0.61–1.24)
GFR, Estimated: 31 mL/min — ABNORMAL LOW (ref >60)
Glucose, Bld: 117 mg/dL — ABNORMAL HIGH (ref 70–99)
Potassium: 3.2 mmol/L — ABNORMAL LOW (ref 3.5–5.1)
Sodium: 137 mmol/L (ref 135–145)
Total Bilirubin: 0.7 mg/dL (ref 0.3–1.2)
Total Protein: 7 g/dL (ref 6.5–8.1)

## 2020-12-02 LAB — PROTIME-INR
INR: 1 (ref 0.8–1.2)
Prothrombin Time: 12.6 seconds (ref 11.4–15.2)

## 2020-12-02 LAB — RAPID URINE DRUG SCREEN, HOSP PERFORMED
Amphetamines: NOT DETECTED
Barbiturates: NOT DETECTED
Benzodiazepines: NOT DETECTED
Cocaine: NOT DETECTED
Opiates: NOT DETECTED
Tetrahydrocannabinol: NOT DETECTED

## 2020-12-02 LAB — DIFFERENTIAL
Abs Immature Granulocytes: 0.03 10*3/uL (ref 0.00–0.07)
Basophils Absolute: 0 10*3/uL (ref 0.0–0.1)
Basophils Relative: 1 %
Eosinophils Absolute: 0.2 10*3/uL (ref 0.0–0.5)
Eosinophils Relative: 2 %
Immature Granulocytes: 0 %
Lymphocytes Relative: 10 %
Lymphs Abs: 0.8 10*3/uL (ref 0.7–4.0)
Monocytes Absolute: 0.7 10*3/uL (ref 0.1–1.0)
Monocytes Relative: 9 %
Neutro Abs: 6.2 10*3/uL (ref 1.7–7.7)
Neutrophils Relative %: 78 %

## 2020-12-02 LAB — CBC
HCT: 42.2 % (ref 39.0–52.0)
Hemoglobin: 14.1 g/dL (ref 13.0–17.0)
MCH: 29.5 pg (ref 26.0–34.0)
MCHC: 33.4 g/dL (ref 30.0–36.0)
MCV: 88.3 fL (ref 80.0–100.0)
Platelets: 315 10*3/uL (ref 150–400)
RBC: 4.78 MIL/uL (ref 4.22–5.81)
RDW: 14.6 % (ref 11.5–15.5)
WBC: 7.9 10*3/uL (ref 4.0–10.5)
nRBC: 0 % (ref 0.0–0.2)

## 2020-12-02 LAB — APTT: aPTT: 24 seconds (ref 24–36)

## 2020-12-02 LAB — RESP PANEL BY RT-PCR (FLU A&B, COVID) ARPGX2
Influenza A by PCR: NEGATIVE
Influenza B by PCR: NEGATIVE
SARS Coronavirus 2 by RT PCR: NEGATIVE

## 2020-12-02 LAB — URINALYSIS, ROUTINE W REFLEX MICROSCOPIC
Bilirubin Urine: NEGATIVE
Glucose, UA: NEGATIVE mg/dL
Hgb urine dipstick: NEGATIVE
Ketones, ur: NEGATIVE mg/dL
Leukocytes,Ua: NEGATIVE
Nitrite: NEGATIVE
Specific Gravity, Urine: 1.016 (ref 1.005–1.030)
pH: 5 (ref 5.0–8.0)

## 2020-12-02 LAB — ETHANOL: Alcohol, Ethyl (B): <10 mg/dL (ref <10)

## 2020-12-02 MED ORDER — LACTATED RINGERS IV SOLN: INTRAVENOUS | Status: DC

## 2020-12-02 MED ORDER — LORAZEPAM 2 MG/ML IJ SOLN: 1.0000 mg | Freq: Once | INTRAMUSCULAR | Status: DC | PRN

## 2020-12-02 MED ORDER — LABETALOL HCL 5 MG/ML IV SOLN: 10.0000 mg | INTRAVENOUS | Status: DC | PRN

## 2020-12-02 MED ORDER — LABETALOL HCL 5 MG/ML IV SOLN
10.0000 mg | Freq: Once | INTRAVENOUS | Status: AC
  Administered 2020-12-02: 10 mg via INTRAVENOUS
  Filled 2020-12-02: qty 4

## 2020-12-02 NOTE — ED Notes (Signed)
[facility] arranged

## 2020-12-02 NOTE — Progress Notes (Signed)
Virtual Visit via Telephone Note  I connected with Adam Gonzales on 12/02/20 at  3:20 PM EDT by telephone and verified that I am speaking with the correct person using two identifiers.   I discussed the limitations, risks, security and privacy concerns of performing an evaluation and management service by telephone and the availability of in person appointments. I also discussed with the patient that there may be a patient responsible charge related to this service. The patient expressed understanding and agreed to proceed.  Location patient: home, [place] Location provider: work or home office Participants present for the call: patient, provider Patient did not have a visit with me in the prior 7 days to address this/these issue(s).   History of Present Illness:  Acute telemedicine visit for Double Vision: -Onset:has felt "off" for several days but vision issues started today -Symptoms include:feels "unsteady on my feet", headache, nausea and ? 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 I do not appreciate any SOB. Speech and thought processing are grossly intact. Patient reported vitals: 150/70s, Temp today 98.2  Assessment and Plan:  Double vision  Nonintractable headache, unspecified chronicity pattern, unspecified  headache type  Gait disturbance  -we discussed possible serious and likely etiologies, options for evaluation and workup, limitations of telemedicine visit vs in person visit, treatment, treatment risks and precautions.  Given his reported acute vision changes today, gait disturbance and headache, advised prompt in person evaluation at a higher level of care is required.    He has agreed to seek in person care right away today at one of the local med centers or emergency departments.  He was trying to decide between going to the med center on [address] or one of the emergency departments.  I did advise him not to drive.  Advised that he take EMS, however he feels that he can go via private vehicle. Advised to seek prompt in person care if worsening, new symptoms arise, or if is not improving with treatment. Advised of options for inperson care in case PCP office not available. Did let the patient know that I only do telemedicine shifts for [facility] on Tuesdays and Thursdays and advised a follow up visit with PCP or at an [facility] if has further questions or concerns.   Follow Up Instructions:  I did not refer this patient for an OV with me in the next 24 hours for this/these issue(s).  I discussed the assessment and treatment plan with the patient. The patient was provided an opportunity to ask questions and all were answered. The patient agreed with the plan and demonstrated an understanding of the instructions.   I spent 65minutes on the date of this visit in the care of this patient. See summary of tasks completed to properly care for this patient in the detailed notes  above which also included counseling of above, review of PMH, medications, allergies, evaluation of the patient and ordering and/or  instructing patient on testing and care options.     Lucretia Kern, DO

## 2020-12-02 NOTE — Patient Instructions (Addendum)
Seek prompt in person care at one of the emergency rooms today.  Please call 911 if needed for transport.  Do not drive given you are having symptoms of double vision and dizziness.      I hope you are feeling better soon!   It was nice to meet you today. I help [facility] out with telemedicine visits on Tuesdays and Thursdays and am available for visits on those days. If you have any concerns or questions following this visit please schedule a follow up visit with your Primary Care doctor or seek care at a local urgent care clinic to avoid delays in care.

## 2020-12-02 NOTE — ED Notes (Signed)
Patient transported to CT 

## 2020-12-02 NOTE — ED Triage Notes (Signed)
States has felt bad since  Monday ,  today has double vision  that comes and goes, drove himself here he states , has had a mild h/a and mild coughing, nauseated

## 2020-12-02 NOTE — Telephone Encounter (Signed)
Pt need OV

## 2020-12-02 NOTE — ED Provider Notes (Signed)
[facility] EMERGENCY DEPT Provider Note   CSN: [id number] Arrival date & time: 12/02/20  1605     History Chief Complaint  Patient presents with   Weakness    Adam Gonzales is a 75 y.o. male.  HPI Patient [place] at 2 PM he was driving his car and developed double vision.  He reports if he looks at certain objects they were double and if he moved his vision and looked at something else it might be a single object.  There were no other associated symptoms.  Patient reports that the symptoms lasted for about 10 minutes.  Currently they are resolved.  Patient denies he had headache or felt unwell.  He does however note that Monday, 4 days ago he awakened and had some nausea and very vague sense of dizziness.  He does not endorse vertiginous symptoms.  He reports that he checked his neurologic status carefully doing all kinds of movements and visual checks and nothing seem to be dysfunctional.  Nonetheless he reports he has felt a little off for the week.  However he has continued to do all of his usual working out and exercise.  Denies has had fever chills malaise body aches.  He takes a daily aspirin.  No prior stroke history.    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    Past Surgical History:  Procedure Laterality Date   CYST EXCISION     HYDROCELE EXCISION Right    PROSTATE BIOPSY     QUADRICEPS TENDON REPAIR Right 01/14/2018   Procedure: REPAIR RIGHT QUADRICEPS TENDON;  Surgeon: Leandrew Koyanagi, MD;  Location: [facility];  Service: Orthopedics;  Laterality: Right;       Family History  Problem Relation Age of Onset   Heart failure Mother    Cancer - Colon Father    Dementia Sister    Throat cancer Brother    Pancreatic cancer Neg Hx    Breast cancer Neg Hx    Prostate cancer Neg Hx     Social History   Tobacco Use   Smoking status: Never   Smokeless tobacco: Never  Vaping Use   Vaping Use: Never used  Substance Use Topics   Alcohol use: Not Currently    Comment: social   Drug use: No    Home Medications Prior to Admission medications   Medication Sig Start Date End Date Taking? Authorizing Provider  aspirin EC 81 MG tablet Take 81 mg by mouth daily.    [provider]  colchicine 0.6 MG tablet Take 0.6 mg by mouth as needed.    [provider]  hydrochlorothiazide (HYDRODIURIL) 12.5 MG tablet Take 12.5 mg by mouth daily. 02/11/19   [provider]  lisinopril (PRINIVIL,ZESTRIL) 40 MG tablet Take 40 mg by mouth daily. 02/15/17   [provider]  spironolactone (ALDACTONE) 25 MG tablet Take 25 mg by mouth daily.    [provider]  Tamsulosin HCl (FLOMAX) 0.4 MG CAPS Take 0.4 mg by mouth  daily.     [provider]    Allergies    Patient has no known allergies.  Review of Systems   Review of Systems 10 systems reviewed negative except as per HPI Physical Exam Updated Vital Signs BP 134/70   Pulse 75   Temp 97.9 F (36.6 C)   Resp 19   SpO2 98%   Physical Exam Constitutional:      Appearance: Normal appearance.     Comments: Well-nourished well-developed alert and interactive.  HENT:     Head: Normocephalic and atraumatic.     Mouth/Throat:     Mouth: Mucous membranes are moist.     Pharynx: Oropharynx is clear.  Eyes:     Extraocular Movements: Extraocular movements intact.     Conjunctiva/sclera: Conjunctivae normal.     Pupils: Pupils are equal, round, and reactive to light.     Comments: Extraocular motions  are conjugate.  Cardiovascular:     Rate and Rhythm: Normal rate and regular rhythm.  Pulmonary:     Effort: Pulmonary effort is normal.     Breath sounds: Normal breath sounds.  Abdominal:     General: There is no distension.     Palpations: Abdomen is soft.  Musculoskeletal:        General: No swelling. Normal range of motion.     Cervical back: Neck supple.  Skin:    General: Skin is warm and dry.  Neurological:     General: No focal deficit present.     Mental Status: He is alert and oriented to person, place, and time.  Psychiatric:        Mood and Affect: Mood normal.    ED Results / Procedures / Treatments   Labs (all labs ordered are listed, but only abnormal results are displayed) Labs Reviewed  COMPREHENSIVE METABOLIC PANEL - Abnormal; Notable for the following components:      Result Value   Potassium 3.2 (*)    CO2 21 (*)    Glucose, Bld 117 (*)    BUN 48 (*)    Creatinine, Ser 2.20 (*)    GFR, Estimated 31 (*)    All other components within normal limits  URINALYSIS, ROUTINE W REFLEX MICROSCOPIC - Abnormal; Notable for the following components:   Protein, ur TRACE (*)    All other components within normal limits  RESP PANEL BY RT-PCR (FLU A&B, COVID) ARPGX2  ETHANOL  PROTIME-INR  APTT  CBC  DIFFERENTIAL  RAPID URINE DRUG SCREEN, HOSP PERFORMED    EKG EKG Interpretation  Date/Time:  Thursday December 02 2020 17:58:21 EDT Ventricular Rate:  68 PR Interval:  204 QRS Duration: 172 QT Interval:  449 QTC Calculation: 478 R Axis:   -76 Text Interpretation: Sinus rhythm Right bundle branch block LVH with IVCD and secondary repol abnrm Borderline prolonged QT interval no sig change from previous Confirmed by Charlesetta Shanks [phone]) on 12/02/2020 11:43:48 PM  Radiology CT HEAD WO CONTRAST  Result Date: 12/02/2020 CLINICAL DATA:  Double vision, possible TIA EXAM: CT HEAD WITHOUT CONTRAST TECHNIQUE: Contiguous axial images were obtained from the base of  the skull through the vertex without intravenous contrast. COMPARISON:  None. FINDINGS: Brain: There is a 1.8 x 1.1 cm area of parenchymal hemorrhage in the right superior parietal lobule with mild surrounding edema. Minimal subdural hemorrhage is present along the falx and tentorium. Small volume subarachnoid hemorrhage is present along the left MCA cistern extending into the anterior sylvian fissure and adjacent frontal  cortical sulci. Minimal additional scattered sulcal subarachnoid hemorrhage is likely present. Suspect thin bilateral convexity subdural hygromas. Gray-white differentiation is preserved. There is no significant mass effect. No hydrocephalus. Vascular: There is atherosclerotic calcification at the skull base. Skull: Calvarium is unremarkable. Sinuses/Orbits: Mild paranasal sinus mucosal thickening. Orbits are unremarkable. Other: None. IMPRESSION: Small volume parenchymal, subdural, and subarachnoid hemorrhage as detailed above. Suspected bilateral cerebral convexity subdural hygromas. No significant mass effect. Vascular imaging and contrast enhanced MRI evaluation are recommended. These results were called by telephone at the time of interpretation on 12/02/2020 at 6:47 pm to provider [facility] , who verbally acknowledged these results. Electronically Signed   By: Macy Mis M.D.   On: 12/02/2020 18:53    Procedures Procedures  CRITICAL CARE Performed by: Charlesetta Shanks   Total critical care time: 30 minutes  Critical care time was exclusive of separately billable procedures and treating other patients.  Critical care was necessary to treat or prevent imminent or life-threatening deterioration.  Critical care was time spent personally by me on the following activities: development of treatment plan with patient and/or surrogate as well as nursing, discussions with consultants, evaluation of patient's response to treatment, examination of patient, obtaining history from  patient or surrogate, ordering and performing treatments and interventions, ordering and review of laboratory studies, ordering and review of radiographic studies, pulse oximetry and re-evaluation of patient's condition.  Medications Ordered in ED Medications  LORazepam (ATIVAN) injection 1 mg (has no administration in time range)  lactated ringers infusion ( Intravenous New Bag/Given 12/02/20 2307)  labetalol (NORMODYNE) injection 10 mg (10 mg Intravenous Given 12/02/20 2201)    ED Course  I have reviewed the triage vital signs and the nursing notes.  Pertinent labs & imaging results that were available during my care of the patient were reviewed by me and considered in my medical decision making (see chart for details).  Clinical Course as of 12/02/20 2344  Thu Dec 02, 2020  2153 Consult: Reviewed with Dr. Kathyrn Sheriff.  Felt that mechanism was not sufficient to result in traumatic injury.  Likely would need evaluation for spontaneous subarachnoid. Consult: Reviewed with Dr. Leonel Ramsay.  Recommends CT angio head and neck, blood pressure control to systolic 782 or less and overnight observation. [MP]    Clinical Course User Index [MP] Charlesetta Shanks, MD   MDM Rules/Calculators/A&P                           Patient presents as outlined.  At this time CT appearance is suggestive of coup countercoup subarachnoid hemorrhage and intracerebral hemorrhage.  However, patient does not give a mechanism of injury consistent.  Patient's mental status is clear.  He had a self-limited episode of double vision today which precipitated his evaluation in the emergency department.  After review with Dr. Leonel Ramsay and Dr. Kathyrn Sheriff, plan will be for further diagnostic evaluation with MRI/MRA.  Systolic blood pressure management of goal less than 140.  Anticipate overnight observation pending MRI results.  At this time patient has clear mental status, normal examination and well in appearance.  Baseline is  healthy and active. Final Clinical Impression(s) / ED Diagnoses Final diagnoses:  SAH (subarachnoid hemorrhage) ([place])    Rx / DC Orders ED Discharge Orders     None        Charlesetta Shanks, MD 12/02/20 [phone]

## 2020-12-02 NOTE — Telephone Encounter (Signed)
Pt is scheduled with Dr Maudie Mercury today.

## 2020-12-02 NOTE — ED Notes (Signed)
Neurosurgery called for consult

## 2020-12-02 NOTE — ED Notes (Signed)
Report called to [place], [place] @ [facility]

## 2020-12-03 ENCOUNTER — Observation Stay (HOSPITAL_COMMUNITY): Payer: Medicare Other

## 2020-12-03 ENCOUNTER — Emergency Department (HOSPITAL_COMMUNITY): Payer: Medicare Other

## 2020-12-03 ENCOUNTER — Observation Stay (HOSPITAL_BASED_OUTPATIENT_CLINIC_OR_DEPARTMENT_OTHER): Payer: Medicare Other

## 2020-12-03 DIAGNOSIS — E876 Hypokalemia: Secondary | ICD-10-CM | POA: Diagnosis not present

## 2020-12-03 DIAGNOSIS — Z8546 Personal history of malignant neoplasm of prostate: Secondary | ICD-10-CM | POA: Diagnosis not present

## 2020-12-03 DIAGNOSIS — N182 Chronic kidney disease, stage 2 (mild): Secondary | ICD-10-CM | POA: Diagnosis not present

## 2020-12-03 DIAGNOSIS — R29898 Other symptoms and signs involving the musculoskeletal system: Secondary | ICD-10-CM | POA: Diagnosis not present

## 2020-12-03 DIAGNOSIS — H532 Diplopia: Secondary | ICD-10-CM | POA: Diagnosis present

## 2020-12-03 DIAGNOSIS — Y93B9 Activity, other involving muscle strengthening exercises: Secondary | ICD-10-CM | POA: Diagnosis not present

## 2020-12-03 DIAGNOSIS — N179 Acute kidney failure, unspecified: Secondary | ICD-10-CM | POA: Diagnosis not present

## 2020-12-03 DIAGNOSIS — G319 Degenerative disease of nervous system, unspecified: Secondary | ICD-10-CM | POA: Diagnosis not present

## 2020-12-03 DIAGNOSIS — Z7982 Long term (current) use of aspirin: Secondary | ICD-10-CM | POA: Diagnosis not present

## 2020-12-03 DIAGNOSIS — W228XXA Striking against or struck by other objects, initial encounter: Secondary | ICD-10-CM | POA: Diagnosis not present

## 2020-12-03 DIAGNOSIS — I6522 Occlusion and stenosis of left carotid artery: Secondary | ICD-10-CM

## 2020-12-03 DIAGNOSIS — I629 Nontraumatic intracranial hemorrhage, unspecified: Secondary | ICD-10-CM | POA: Diagnosis not present

## 2020-12-03 DIAGNOSIS — N189 Chronic kidney disease, unspecified: Secondary | ICD-10-CM | POA: Diagnosis present

## 2020-12-03 DIAGNOSIS — Z79899 Other long term (current) drug therapy: Secondary | ICD-10-CM | POA: Diagnosis not present

## 2020-12-03 DIAGNOSIS — S06360A Traumatic hemorrhage of cerebrum, unspecified, without loss of consciousness, initial encounter: Secondary | ICD-10-CM | POA: Diagnosis not present

## 2020-12-03 DIAGNOSIS — I609 Nontraumatic subarachnoid hemorrhage, unspecified: Secondary | ICD-10-CM

## 2020-12-03 DIAGNOSIS — Y9239 Other specified sports and athletic area as the place of occurrence of the external cause: Secondary | ICD-10-CM | POA: Diagnosis not present

## 2020-12-03 DIAGNOSIS — I6502 Occlusion and stenosis of left vertebral artery: Secondary | ICD-10-CM | POA: Diagnosis not present

## 2020-12-03 DIAGNOSIS — I6389 Other cerebral infarction: Secondary | ICD-10-CM | POA: Diagnosis not present

## 2020-12-03 DIAGNOSIS — S066X0A Traumatic subarachnoid hemorrhage without loss of consciousness, initial encounter: Secondary | ICD-10-CM | POA: Diagnosis not present

## 2020-12-03 DIAGNOSIS — I129 Hypertensive chronic kidney disease with stage 1 through stage 4 chronic kidney disease, or unspecified chronic kidney disease: Secondary | ICD-10-CM | POA: Diagnosis not present

## 2020-12-03 DIAGNOSIS — N400 Enlarged prostate without lower urinary tract symptoms: Secondary | ICD-10-CM | POA: Diagnosis not present

## 2020-12-03 DIAGNOSIS — Z20822 Contact with and (suspected) exposure to covid-19: Secondary | ICD-10-CM | POA: Diagnosis not present

## 2020-12-03 DIAGNOSIS — I10 Essential (primary) hypertension: Secondary | ICD-10-CM

## 2020-12-03 DIAGNOSIS — M6281 Muscle weakness (generalized): Secondary | ICD-10-CM | POA: Diagnosis not present

## 2020-12-03 DIAGNOSIS — I618 Other nontraumatic intracerebral hemorrhage: Secondary | ICD-10-CM | POA: Diagnosis not present

## 2020-12-03 DIAGNOSIS — G936 Cerebral edema: Secondary | ICD-10-CM | POA: Diagnosis not present

## 2020-12-03 LAB — ECHOCARDIOGRAM COMPLETE
AR max vel: 1.79 cm2
AV Area VTI: 1.55 cm2
AV Area mean vel: 1.85 cm2
AV Mean grad: 13.5 mmHg
AV Peak grad: 26.4 mmHg
Ao pk vel: 2.57 m/s
Area-P 1/2: 2.36 cm2
S' Lateral: 2.6 cm

## 2020-12-03 LAB — BASIC METABOLIC PANEL
Anion gap: 10 (ref 5–15)
BUN: 38 mg/dL — ABNORMAL HIGH (ref 8–23)
CO2: 21 mmol/L — ABNORMAL LOW (ref 22–32)
Calcium: 9.4 mg/dL (ref 8.9–10.3)
Chloride: 104 mmol/L (ref 98–111)
Creatinine, Ser: 1.47 mg/dL — ABNORMAL HIGH (ref 0.61–1.24)
GFR, Estimated: 50 mL/min — ABNORMAL LOW (ref >60)
Glucose, Bld: 141 mg/dL — ABNORMAL HIGH (ref 70–99)
Potassium: 3 mmol/L — ABNORMAL LOW (ref 3.5–5.1)
Sodium: 135 mmol/L (ref 135–145)

## 2020-12-03 LAB — MAGNESIUM: Magnesium: 2.1 mg/dL (ref 1.7–2.4)

## 2020-12-03 MED ORDER — GADOBUTROL 1 MMOL/ML IV SOLN
8.0000 mL | Freq: Once | INTRAVENOUS | Status: AC | PRN
  Administered 2020-12-03: 8 mL via INTRAVENOUS

## 2020-12-03 MED ORDER — ACETAMINOPHEN 325 MG PO TABS: 650.0000 mg | ORAL_TABLET | Freq: Four times a day (QID) | ORAL | Status: DC | PRN

## 2020-12-03 MED ORDER — POTASSIUM CHLORIDE CRYS ER 20 MEQ PO TBCR
40.0000 meq | EXTENDED_RELEASE_TABLET | ORAL | Status: AC
  Administered 2020-12-03: 40 meq via ORAL
  Filled 2020-12-03: qty 2

## 2020-12-03 MED ORDER — ACETAMINOPHEN 650 MG RE SUPP: 650.0000 mg | Freq: Four times a day (QID) | RECTAL | Status: DC | PRN

## 2020-12-03 MED ORDER — LABETALOL HCL 5 MG/ML IV SOLN
10.0000 mg | Freq: Once | INTRAVENOUS | Status: DC
  Filled 2020-12-03: qty 4

## 2020-12-03 MED ORDER — PANTOPRAZOLE SODIUM 40 MG PO TBEC
40.0000 mg | DELAYED_RELEASE_TABLET | Freq: Every day | ORAL | Status: DC
  Administered 2020-12-03 – 2020-12-04 (×2): 40 mg via ORAL
  Filled 2020-12-03 (×2): qty 1

## 2020-12-03 MED ORDER — CLEVIDIPINE BUTYRATE 0.5 MG/ML IV EMUL
0.0000 mg/h | INTRAVENOUS | Status: DC
  Administered 2020-12-03: 1 mg/h via INTRAVENOUS
  Filled 2020-12-03 (×2): qty 50

## 2020-12-03 MED ORDER — TAMSULOSIN HCL 0.4 MG PO CAPS
0.4000 mg | ORAL_CAPSULE | Freq: Every day | ORAL | Status: DC
  Administered 2020-12-03 – 2020-12-04 (×2): 0.4 mg via ORAL
  Filled 2020-12-03 (×2): qty 1

## 2020-12-03 MED ORDER — ALBUTEROL SULFATE (2.5 MG/3ML) 0.083% IN NEBU
2.5000 mg | INHALATION_SOLUTION | Freq: Four times a day (QID) | RESPIRATORY_TRACT | Status: DC | PRN
Start: 2020-12-03 — End: 2020-12-04

## 2020-12-03 MED ORDER — LABETALOL HCL 5 MG/ML IV SOLN: 10.0000 mg | INTRAVENOUS | Status: DC | PRN

## 2020-12-03 MED ORDER — SODIUM CHLORIDE 0.9% FLUSH
3.0000 mL | Freq: Two times a day (BID) | INTRAVENOUS | Status: DC
  Administered 2020-12-03 – 2020-12-04 (×3): 3 mL via INTRAVENOUS

## 2020-12-03 NOTE — H&P (Signed)
History and Physical    [name]:[id number] DOB: [birth year] DOA: 12/02/2020  Referring MD/NP/PA: Shela Leff, MD PCP: Vivi Barrack, MD  Patient coming from: [facility]   Chief Complaint: Double vision  I have personally briefly reviewed patient's old medical records in [place]   HPI: Adam Gonzales is a 75 y.o. male with medical history significant of hypertension, left carotid artery occlusion, gout, and prostate cancer s/p radiation who presented with complaints of double vision yesterday afternoon around 2 PM.  Symptoms seem to have all started after he was at the gym 5 days ago, and accidentally hit the back of his head against a stationary bar while leaning back on the weight bench.  He did not lose consciousness, but immediately felt some pain where his head hit the bar.  He developed a bump on the back of his head which he thought was to be expected.  The following day he reported feeling "sickly", but was able to go to work.  Reports having this dull achy feeling all over his head, nausea, couple episodes where he almost vomited, and some lightheadedness.  Patient notes that he felt so bad that he did not go to work on 10/25, but was able to [employer] [employer] [facility] where he works the following days.  He is on aspirin but no other blood thinners.  Denies having any change in his balance, coordination, or any focal weakness.  Yesterday, afternoon as he was driving he noticed that he was seen double of the cars in his lane, but nothing else was double likely cars on the opposite side of the road or trees.  He had a televisit with his PCP that afternoon who recommended him to come to the hospital for further evaluation.  ED Course: Upon admission into the emergency department patient was noted to be afebrile, respirations 10-27, blood pressures 121/88-161/84, and all other vital signs maintained.  CT scan of the brain showed 1.8 x 1.1 cm posterior right  parietal intraparenchymal hemorrhage with surrounding vasogenic edema and small left subarachnoid hemorrhage of the left MCA cistern.  Neurology and neurosurgery were consulted.  Recommendations were for admission for observation with repeat imaging as there appeared to be no acute need for surgical intervention.  Labs from 10/27 significant for CBC within normal limits, potassium 3.2, BUN 48, and creatinine 2.2.  Urinalysis noted trace protein, but did not note any signs of infection.  UDS was negative and alcohol level undetectable.  Influenza and COVID-19 screening was negative.  ICU admission was not thought to be needed.  Systolic blood pressure control recommended less than 160 per neurology.  [facility] has been called to admit.  MRI brain with MRA of the head and neck noted stable acute intraparenchymal hemorrhage positioned at the right parietal convexity similar to prior CT with edema without regional mass-effect or shift, stable scattered small volume subarachnoid, and superimposed small bilateral subdural effusions without mass-effect.  Repeat CT scan of the brain was unchanged.  Patient had been given Ativan for MRI, labetalol IV for blood pressure control, and started on lactated Ringer's at 125 mL/h.  Review of Systems  Constitutional:  Positive for malaise/fatigue. Negative for fever.  HENT:  Negative for congestion and nosebleeds.   Eyes:  Positive for double vision. Negative for photophobia and pain.  Respiratory:  Negative for cough and shortness of breath.   Cardiovascular:  Negative for chest pain and leg swelling.  Gastrointestinal:  Positive for nausea.  Negative for abdominal pain, diarrhea and vomiting.  Genitourinary:  Negative for dysuria and flank pain.  Musculoskeletal:  Negative for falls.  Skin:  Negative for rash.  Neurological:  Positive for dizziness (lightheadedness) and headaches. Negative for focal weakness and loss of consciousness.  Psychiatric/Behavioral:  Negative for  substance abuse.    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   Gout    Hypertension    Prostate cancer ([place])    Quadriceps tendon rupture, right, initial encounter     Past Surgical History:  Procedure Laterality Date   CYST EXCISION     HYDROCELE EXCISION Right    PROSTATE BIOPSY     QUADRICEPS TENDON REPAIR Right 01/14/2018   Procedure: REPAIR RIGHT QUADRICEPS TENDON;  Surgeon: Leandrew Koyanagi, MD;  Location: [facility];  Service: Orthopedics;  Laterality: Right;     reports that he has never smoked. He has never used smokeless tobacco. He reports that he does not currently use alcohol. He reports that he does not use drugs.  No Known Allergies  Family History  Problem Relation Age of Onset   Heart failure Mother    Cancer - Colon Father    Dementia Sister    Throat cancer Brother    Pancreatic cancer Neg Hx    Breast cancer Neg Hx    Prostate cancer Neg Hx     Prior to Admission medications   Medication Sig Start Date End Date Taking? Authorizing Provider  aspirin EC 81 MG tablet Take 81 mg by mouth daily.   Yes [provider]  colchicine 0.6 MG tablet Take 0.6 mg by mouth as needed (gout flares).   Yes [provider]  hydrochlorothiazide (HYDRODIURIL) 12.5 MG tablet Take 12.5 mg by mouth daily. 02/11/19  Yes [provider]  lisinopril (PRINIVIL,ZESTRIL) 40 MG tablet Take 40 mg by mouth daily. 02/15/17  Yes [provider]  spironolactone (ALDACTONE) 25 MG tablet Take 25 mg by mouth daily.   Yes [provider]  Tamsulosin HCl (FLOMAX) 0.4 MG CAPS Take 0.4 mg by mouth daily.  Patient not taking: Reported on 12/03/2020    [provider]    Physical Exam:  Constitutional: Elderly male in NAD, calm, comfortable Vitals:   12/03/20 0415 12/03/20 0445 12/03/20 0500 12/03/20 0642  BP: 132/76 127/75 121/88 134/74  Pulse: 85 71 77 84  Resp: (!) 27 14 13  (!) 24  Temp:       SpO2: 96% 96% 99% 99%   Eyes: PERRL, lids and conjunctivae normal ENMT: Mucous membranes are moist. Posterior pharynx clear of any exudate or lesions.  Neck: normal, supple, no masses, no thyromegaly Respiratory: clear to auscultation bilaterally, no wheezing, no crackles. Normal respiratory effort. No accessory muscle use.  Cardiovascular: Regular rate and rhythm, mild 2/6 systolic murmur.  No lower extremity edema.   Abdomen: Mild lower abdominal tenderness palpation, no masses palpated. No hepatosplenomegaly. Bowel sounds positive.  Musculoskeletal: no clubbing / cyanosis. No joint deformity upper and lower extremities. Good ROM, no contractures. Normal muscle tone.  Skin: no rashes, lesions, ulcers. No induration Neurologic: CN 2-12 grossly intact. Sensation intact, DTR normal. Strength 5/5 in all 4.  Psychiatric: Normal judgment and insight. Alert and oriented x 3. Normal mood.     Labs on Admission: I have personally reviewed following labs and imaging studies  CBC: Recent Labs  Lab 12/02/20 1702  WBC 7.9  NEUTROABS 6.2  HGB 14.1  HCT 42.2  MCV 88.3  PLT 350   Basic Metabolic Panel: Recent Labs  Lab 12/02/20 1702  NA 137  K 3.2*  CL 103  CO2 21*  GLUCOSE 117*  BUN 48*  CREATININE 2.20*  CALCIUM 9.6   GFR: CrCl cannot be calculated (Unknown ideal weight.). Liver Function Tests: Recent Labs  Lab 12/02/20 1702  AST 36  ALT 21  ALKPHOS 53  BILITOT 0.7  PROT 7.0  ALBUMIN 4.3   No results for input(s): LIPASE, AMYLASE in the last 168 hours. No results for input(s): AMMONIA in the last 168 hours. Coagulation Profile: Recent Labs  Lab 12/02/20 1702  INR 1.0   Cardiac Enzymes: No results for input(s): CKTOTAL, CKMB, CKMBINDEX, TROPONINI in the last 168 hours. BNP (last 3 results) No results for input(s): PROBNP in the last 8760 hours. HbA1C: No results for input(s): HGBA1C in the last 72 hours. CBG: No results for input(s): GLUCAP in the last 168  hours. Lipid Profile: No results for input(s): CHOL, HDL, LDLCALC, TRIG, CHOLHDL, LDLDIRECT in the last 72 hours. Thyroid Function Tests: No results for input(s): TSH, T4TOTAL, FREET4, T3FREE, THYROIDAB in the last 72 hours. Anemia Panel: No results for input(s): VITAMINB12, FOLATE, FERRITIN, TIBC, IRON, RETICCTPCT in the last 72 hours. Urine analysis:    Component Value Date/Time   COLORURINE YELLOW 12/02/2020 1814   APPEARANCEUR CLEAR 12/02/2020 1814   LABSPEC 1.016 12/02/2020 1814   PHURINE 5.0 12/02/2020 1814   GLUCOSEU NEGATIVE 12/02/2020 1814   HGBUR NEGATIVE 12/02/2020 1814   BILIRUBINUR NEGATIVE 12/02/2020 1814   KETONESUR NEGATIVE 12/02/2020 1814   PROTEINUR TRACE (A) 12/02/2020 1814   NITRITE NEGATIVE 12/02/2020 1814   LEUKOCYTESUR NEGATIVE 12/02/2020 1814   Sepsis Labs: Recent Results (from the past 240 hour(s))  Resp Panel by RT-PCR (Flu A&B, Covid) Nasopharyngeal Swab     Status: None   Collection Time: 12/02/20  6:14 PM   Specimen: Nasopharyngeal Swab; Nasopharyngeal(NP) swabs in vial transport medium  Result Value Ref Range Status   SARS Coronavirus 2 by RT PCR NEGATIVE NEGATIVE Final    Comment: (NOTE) SARS-CoV-2 target nucleic acids are NOT DETECTED.  The SARS-CoV-2 RNA is generally detectable in upper respiratory specimens during the acute phase of infection. The lowest concentration of SARS-CoV-2 viral copies this assay can detect is 138 copies/mL. A negative result does not preclude SARS-Cov-2 infection and should not be used as the sole basis for treatment or other patient management decisions. A negative result may occur with  improper specimen collection/handling, submission of specimen other than nasopharyngeal swab, presence of viral mutation(s) within the areas targeted by this assay, and inadequate number of viral copies(<138 copies/mL). A negative result must be combined with clinical observations, patient history, and  epidemiological information. The expected result is Negative.  Fact Sheet for Patients:  EntrepreneurPulse.com.au  Fact Sheet for Healthcare Providers:  IncredibleEmployment.be  This test is no t yet approved or cleared by the [place] FDA and  has been authorized for detection and/or diagnosis of SARS-CoV-2 by FDA under an Emergency Use Authorization (EUA). This EUA will remain  in effect (meaning this test can be used) for the duration of the COVID-19 declaration under Section 564(b)(1) of the Act, 21 U.S.C.section 360bbb-3(b)(1), unless the authorization is terminated  or revoked sooner.       Influenza A by PCR NEGATIVE NEGATIVE Final   Influenza B by PCR NEGATIVE NEGATIVE Final    Comment: (NOTE) The Xpert Xpress SARS-CoV-2/FLU/RSV plus assay is  intended as an aid in the diagnosis of influenza from Nasopharyngeal swab specimens and should not be used as a sole basis for treatment. Nasal washings and aspirates are unacceptable for Xpert Xpress SARS-CoV-2/FLU/RSV testing.  Fact Sheet for Patients: EntrepreneurPulse.com.au  Fact Sheet for Healthcare Providers: IncredibleEmployment.be  This test is not yet approved or cleared by the [place] FDA and has been authorized for detection and/or diagnosis of SARS-CoV-2 by FDA under an Emergency Use Authorization (EUA). This EUA will remain in effect (meaning this test can be used) for the duration of the COVID-19 declaration under Section 564(b)(1) of the Act, 21 U.S.C. section 360bbb-3(b)(1), unless the authorization is terminated or revoked.  Performed at [facility], [address] [place], [place] [address]      Radiological Exams on Admission: CT Head Wo Contrast  Result Date: 12/03/2020 CLINICAL DATA:  75 year old male with history of stroke. Follow-up study. EXAM: CT HEAD WITHOUT CONTRAST TECHNIQUE: Contiguous  axial images were obtained from the base of the skull through the vertex without intravenous contrast. COMPARISON:  MRI brain 12/03/2020.  Head CT 12/02/2020. FINDINGS: Brain: High attenuation in the high right parietal region involving white matter, subcortical and cortical gray matter (axial image 24 of series 2 and coronal image 47 of series 4), similar to the prior study, currently measuring 1.9 x 1.2 x 2.1 cm with surrounding low attenuation compatible with vasogenic edema, exerting minimal local mass effect without associated midline shift. Scattered extra-axial high attenuation is also noted overlying the left cerebral hemisphere, most evident in the region of the anterior aspect of the left sylvian fissure and left frontal region, similar to the prior examination. No definite intraventricular hemorrhage. No hydrocephalus. No midline shift or other findings to suggest herniation. Mild cerebral atrophy. Patchy and confluent areas of decreased attenuation are noted throughout the deep and periventricular white matter of the cerebral hemispheres bilaterally, compatible with chronic microvascular ischemic disease. Expansion of the subdural spaces bilaterally (left greater than right), similar to the prior examination, suspicious for probable hygromas. Vascular: Vascular calcifications are noted in the carotid siphons bilaterally. Vascular calcifications of the vertebral arteries bilaterally. Skull: Normal. Negative for fracture or focal lesion. Sinuses/Orbits: Mucoperiosteal thickening in the right maxillary sinus. No air-fluid levels. Other: None. IMPRESSION: 1. Subacute intraparenchymal hemorrhage in the posterior right parietal convexity with surrounding vasogenic edema, similar to prior examinations, as above. 2. Small volume of subarachnoid hemorrhage most evident in the left sylvian fissure and left frontal region, similar to the prior examination. 3. No intraventricular hemorrhage or hydrocephalus. 4.  Probable bilateral small subdural hygromas. 5. Mild cerebral atrophy with chronic microvascular ischemic changes in the cerebral white matter, as above. Electronically Signed   By: Vinnie Langton M.D.   On: 12/03/2020 05:58   CT HEAD WO CONTRAST  Result Date: 12/02/2020 CLINICAL DATA:  Double vision, possible TIA EXAM: CT HEAD WITHOUT CONTRAST TECHNIQUE: Contiguous axial images were obtained from the base of the skull through the vertex without intravenous contrast. COMPARISON:  None. FINDINGS: Brain: There is a 1.8 x 1.1 cm area of parenchymal hemorrhage in the right superior parietal lobule with mild surrounding edema. Minimal subdural hemorrhage is present along the falx and tentorium. Small volume subarachnoid hemorrhage is present along the left MCA cistern extending into the anterior sylvian fissure and adjacent frontal cortical sulci. Minimal additional scattered sulcal subarachnoid hemorrhage is likely present. Suspect thin bilateral convexity subdural hygromas. Gray-white differentiation is preserved. There is no significant mass effect. No hydrocephalus. Vascular:  There is atherosclerotic calcification at the skull base. Skull: Calvarium is unremarkable. Sinuses/Orbits: Mild paranasal sinus mucosal thickening. Orbits are unremarkable. Other: None. IMPRESSION: Small volume parenchymal, subdural, and subarachnoid hemorrhage as detailed above. Suspected bilateral cerebral convexity subdural hygromas. No significant mass effect. Vascular imaging and contrast enhanced MRI evaluation are recommended. These results were called by telephone at the time of interpretation on 12/02/2020 at 6:47 pm to provider [facility] , who verbally acknowledged these results. Electronically Signed   By: Macy Mis M.D.   On: 12/02/2020 18:53   MR ANGIO HEAD WO CONTRAST  Result Date: 12/03/2020 CLINICAL DATA:  Follow-up examination for acute intracranial hemorrhage. EXAM: MRI HEAD WITHOUT CONTRAST MRA HEAD  WITHOUT CONTRAST MRA NECK WITHOUT AND WITH CONTRAST TECHNIQUE: Multiplanar, multi-echo pulse sequences of the brain and surrounding structures were acquired without intravenous contrast. Angiographic images of the Circle of Willis were acquired using MRA technique without intravenous contrast. Angiographic images of the neck were acquired using MRA technique without and with intravenous contrast. Carotid stenosis measurements (when applicable) are obtained utilizing NASCET criteria, using the distal internal carotid diameter as the denominator. CONTRAST:  66mL GADAVIST GADOBUTROL 1 MMOL/ML IV SOLN COMPARISON:  Comparison made with prior CT from 12/02/2020. FINDINGS: MRI HEAD FINDINGS Brain: Moderately advanced age-related cerebral atrophy. Scattered patchy T2/FLAIR hyperintensity within the periventricular and deep white matter both cerebral hemispheres most consistent with chronic small vessel ischemic disease, overall mild for age. Previously identified intraparenchymal hemorrhage positioned at the posterior right parietal convexity again seen, relatively stable in size measuring 1.8 x 0.9 x 2.1 cm (estimated volume 1.7 mL). Mild surrounding vasogenic edema without significant regional mass effect or midline shift. No visible underlying lesion seen on this noncontrast examination. Additional scattered small volume subarachnoid hemorrhage involving the anterior left frontotemporal region and left sylvian fissure again seen. Small volume subarachnoid blood overlies the posterior left parietal convexity as well. Overall appearance is stable from prior CT as well. Probable trace scattered subarachnoid blood seen elsewhere within the brain on SWI sequence, likely related to redistribution. Trace susceptibility artifact within the occipital horn of the right lateral ventricle also likely related to recirculation. No other foci of susceptibility artifact to suggest acute or chronic intracranial hemorrhage elsewhere within  the brain. No other diffusion abnormality to suggest acute or subacute ischemia. Gray-white matter differentiation otherwise maintained. No mass lesion or midline shift. No hydrocephalus. Small subdural hygromas/effusions overlie the bilateral cerebral convexities measuring up to approximately 2 mm bilaterally. Pituitary gland suprasellar region within normal limits. Midline structures intact. Vascular: Loss of normal flow void within the left ICA to the level of the terminus, consistent with known chronic left ICA occlusion. Major intracranial vascular flow voids are otherwise maintained. Skull and upper cervical spine: Craniocervical junction within normal limits. Degenerative spondylosis at C3-4 with resultant mild to moderate spinal stenosis (series 9, image 14). Bone marrow signal intensity heterogeneous without focal marrow replacing lesion. No scalp soft tissue abnormality. Sinuses/Orbits: Globes and orbital soft tissues within normal limits. Scattered mucosal thickening noted within the ethmoidal air cells and maxillary sinuses. No mastoid effusion. Inner ear structures grossly normal. Other: None. MRA HEAD FINDINGS Anterior circulation: Distal cervical segment of the right internal carotid artery patent with antegrade flow. Petrous right ICA patent. Atheromatous irregularity within the right carotid siphon with no more than mild multifocal narrowing. The cervical left ICA is occluded. Distal reconstitution with attenuated flow within the supraclinoid segment, likely collateral across the circle-of-Willis. Left M1 segment attenuated but patent.  No visible aneurysm about the left MCA bifurcation. Distal left MCA branches attenuated but perfused distally. A1 segments patent bilaterally. Normal anterior communicating artery complex. Anterior cerebral arteries patent to their distal aspects without stenosis. Right M1 segment patent. Normal right MCA bifurcation. Distal right MCA branches well perfused. No  aneurysm or other vascular abnormality about the anterior circulation. Posterior circulation: Visualized distal V4 segments patent without stenosis. Left PICA origin patent and normal. Right PICA not seen. Basilar patent to its distal aspect without stenosis. Superior cerebellar arteries patent bilaterally. Left PCA primarily supplied via the basilar. Predominant fetal type origin of the right PCA. Both PCAs well perfused to their distal aspects. Distal small vessel atheromatous irregularity seen throughout the intracranial circulation. Anatomic variants: Fetal type origin of the right PCA. MRA NECK FINDINGS Aortic arch: Visualized aortic arch normal caliber with normal branch pattern. No stenosis. Right carotid system: Right CCA patent without stenosis. Mild atheromatous irregularity about the right carotid bulb/proximal right ICA without significant stenosis. Right ICA patent distally without stenosis, evidence for dissection or occlusion. Left carotid system: Left CCA patent without stenosis. Occlusion of the left ICA at its origin, stable from previous. Left ICA remains occluded within the neck. Left external carotid artery and its branches remain perfused. Vertebral arteries: Both vertebral arteries arise from the subclavian arteries. No proximal subclavian artery stenosis. Short-segment mild-to-moderate stenosis at the origin of the left vertebral artery noted (series 1029, image 8). Vertebral arteries otherwise patent without stenosis, evidence for dissection or occlusion. Other: None IMPRESSION: MRI HEAD: 1. 2.1 cm acute intraparenchymal hemorrhage positioned at the posterior right parietal convexity, stable from previous CT. Mild surrounding edema without significant regional mass effect or midline shift. Etiology of this hemorrhage is unclear, with no underlying mass or other abnormality evident on this noncontrast examination. 2. Additional scattered small volume subarachnoid hemorrhage, predominantly  localized at the left sylvian fissure, also stable from prior. 3. Superimposed small bilateral subdural hygromas/effusions without significant mass effect or midline shift. 4. Underlying age-related cerebral atrophy with mild chronic small vessel ischemic disease. MRA HEAD: 1. 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 Distal reconstitution at the supraclinoid segment with attenuated but patent flow throughout the left MCA distribution. Appearance is relatively similar to prior CTA from 2019. 2. Otherwise stable patency of the intracranial arterial circulation. No hemodynamically significant or correctable stenosis. 3. No vascular abnormality to explain the acute intracranial hemorrhage is identified. MRA NECK: 1. Occlusion of the left ICA at its origin, stable from prior. 2. Atheromatous change about the right carotid bifurcation without hemodynamically significant stenosis. Right carotid system remains widely patent within the neck. 3. Short-segment mild-to-moderate stenosis at the origin of the left vertebral artery. Otherwise wide patency of both vertebral arteries within the neck. Electronically Signed   By: Jeannine Boga M.D.   On: 12/03/2020 04:27   MR Angiogram Neck W or Wo Contrast  Result Date: 12/03/2020 CLINICAL DATA:  Follow-up examination for acute intracranial hemorrhage. EXAM: MRI HEAD WITHOUT CONTRAST MRA HEAD WITHOUT CONTRAST MRA NECK WITHOUT AND WITH CONTRAST TECHNIQUE: Multiplanar, multi-echo pulse sequences of the brain and surrounding structures were acquired without intravenous contrast. Angiographic images of the Circle of Willis were acquired using MRA technique without intravenous contrast. Angiographic images of the neck were acquired using MRA technique without and with intravenous contrast. Carotid stenosis measurements (when applicable) are obtained utilizing NASCET criteria, using the distal internal carotid diameter as the denominator. CONTRAST:  82mL GADAVIST GADOBUTROL 1  MMOL/ML IV SOLN COMPARISON:  Comparison made with prior CT from 12/02/2020. FINDINGS: MRI HEAD FINDINGS Brain: Moderately advanced age-related cerebral atrophy. Scattered patchy T2/FLAIR hyperintensity within the periventricular and deep white matter both cerebral hemispheres most consistent with chronic small vessel ischemic disease, overall mild for age. Previously identified intraparenchymal hemorrhage positioned at the posterior right parietal convexity again seen, relatively stable in size measuring 1.8 x 0.9 x 2.1 cm (estimated volume 1.7 mL). Mild surrounding vasogenic edema without significant regional mass effect or midline shift. No visible underlying lesion seen on this noncontrast examination. Additional scattered small volume subarachnoid hemorrhage involving the anterior left frontotemporal region and left sylvian fissure again seen. Small volume subarachnoid blood overlies the posterior left parietal convexity as well. Overall appearance is stable from prior CT as well. Probable trace scattered subarachnoid blood seen elsewhere within the brain on SWI sequence, likely related to redistribution. Trace susceptibility artifact within the occipital horn of the right lateral ventricle also likely related to recirculation. No other foci of susceptibility artifact to suggest acute or chronic intracranial hemorrhage elsewhere within the brain. No other diffusion abnormality to suggest acute or subacute ischemia. Gray-white matter differentiation otherwise maintained. No mass lesion or midline shift. No hydrocephalus. Small subdural hygromas/effusions overlie the bilateral cerebral convexities measuring up to approximately 2 mm bilaterally. Pituitary gland suprasellar region within normal limits. Midline structures intact. Vascular: Loss of normal flow void within the left ICA to the level of the terminus, consistent with known chronic left ICA occlusion. Major intracranial vascular flow voids are otherwise  maintained. Skull and upper cervical spine: Craniocervical junction within normal limits. Degenerative spondylosis at C3-4 with resultant mild to moderate spinal stenosis (series 9, image 14). Bone marrow signal intensity heterogeneous without focal marrow replacing lesion. No scalp soft tissue abnormality. Sinuses/Orbits: Globes and orbital soft tissues within normal limits. Scattered mucosal thickening noted within the ethmoidal air cells and maxillary sinuses. No mastoid effusion. Inner ear structures grossly normal. Other: None. MRA HEAD FINDINGS Anterior circulation: Distal cervical segment of the right internal carotid artery patent with antegrade flow. Petrous right ICA patent. Atheromatous irregularity within the right carotid siphon with no more than mild multifocal narrowing. The cervical left ICA is occluded. Distal reconstitution with attenuated flow within the supraclinoid segment, likely collateral across the circle-of-Willis. Left M1 segment attenuated but patent. No visible aneurysm about the left MCA bifurcation. Distal left MCA branches attenuated but perfused distally. A1 segments patent bilaterally. Normal anterior communicating artery complex. Anterior cerebral arteries patent to their distal aspects without stenosis. Right M1 segment patent. Normal right MCA bifurcation. Distal right MCA branches well perfused. No aneurysm or other vascular abnormality about the anterior circulation. Posterior circulation: Visualized distal V4 segments patent without stenosis. Left PICA origin patent and normal. Right PICA not seen. Basilar patent to its distal aspect without stenosis. Superior cerebellar arteries patent bilaterally. Left PCA primarily supplied via the basilar. Predominant fetal type origin of the right PCA. Both PCAs well perfused to their distal aspects. Distal small vessel atheromatous irregularity seen throughout the intracranial circulation. Anatomic variants: Fetal type origin of the  right PCA. MRA NECK FINDINGS Aortic arch: Visualized aortic arch normal caliber with normal branch pattern. No stenosis. Right carotid system: Right CCA patent without stenosis. Mild atheromatous irregularity about the right carotid bulb/proximal right ICA without significant stenosis. Right ICA patent distally without stenosis, evidence for dissection or occlusion. Left carotid system: Left CCA patent without stenosis. Occlusion of the left ICA at its origin, stable from previous. Left ICA remains occluded  within the neck. Left external carotid artery and its branches remain perfused. Vertebral arteries: Both vertebral arteries arise from the subclavian arteries. No proximal subclavian artery stenosis. Short-segment mild-to-moderate stenosis at the origin of the left vertebral artery noted (series 1029, image 8). Vertebral arteries otherwise patent without stenosis, evidence for dissection or occlusion. Other: None IMPRESSION: MRI HEAD: 1. 2.1 cm acute intraparenchymal hemorrhage positioned at the posterior right parietal convexity, stable from previous CT. Mild surrounding edema without significant regional mass effect or midline shift. Etiology of this hemorrhage is unclear, with no underlying mass or other abnormality evident on this noncontrast examination. 2. Additional scattered small volume subarachnoid hemorrhage, predominantly localized at the left sylvian fissure, also stable from prior. 3. Superimposed small bilateral subdural hygromas/effusions without significant mass effect or midline shift. 4. Underlying age-related cerebral atrophy with mild chronic small vessel ischemic disease. MRA HEAD: 1. 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 Distal reconstitution at the supraclinoid segment with attenuated but patent flow throughout the left MCA distribution. Appearance is relatively similar to prior CTA from 2019. 2. Otherwise stable patency of the intracranial arterial circulation. No hemodynamically  significant or correctable stenosis. 3. No vascular abnormality to explain the acute intracranial hemorrhage is identified. MRA NECK: 1. Occlusion of the left ICA at its origin, stable from prior. 2. Atheromatous change about the right carotid bifurcation without hemodynamically significant stenosis. Right carotid system remains widely patent within the neck. 3. Short-segment mild-to-moderate stenosis at the origin of the left vertebral artery. Otherwise wide patency of both vertebral arteries within the neck. Electronically Signed   By: Jeannine Boga M.D.   On: 12/03/2020 04:27   MR BRAIN WO CONTRAST  Result Date: 12/03/2020 CLINICAL DATA:  Follow-up examination for acute intracranial hemorrhage. EXAM: MRI HEAD WITHOUT CONTRAST MRA HEAD WITHOUT CONTRAST MRA NECK WITHOUT AND WITH CONTRAST TECHNIQUE: Multiplanar, multi-echo pulse sequences of the brain and surrounding structures were acquired without intravenous contrast. Angiographic images of the Circle of Willis were acquired using MRA technique without intravenous contrast. Angiographic images of the neck were acquired using MRA technique without and with intravenous contrast. Carotid stenosis measurements (when applicable) are obtained utilizing NASCET criteria, using the distal internal carotid diameter as the denominator. CONTRAST:  65mL GADAVIST GADOBUTROL 1 MMOL/ML IV SOLN COMPARISON:  Comparison made with prior CT from 12/02/2020. FINDINGS: MRI HEAD FINDINGS Brain: Moderately advanced age-related cerebral atrophy. Scattered patchy T2/FLAIR hyperintensity within the periventricular and deep white matter both cerebral hemispheres most consistent with chronic small vessel ischemic disease, overall mild for age. Previously identified intraparenchymal hemorrhage positioned at the posterior right parietal convexity again seen, relatively stable in size measuring 1.8 x 0.9 x 2.1 cm (estimated volume 1.7 mL). Mild surrounding vasogenic edema without  significant regional mass effect or midline shift. No visible underlying lesion seen on this noncontrast examination. Additional scattered small volume subarachnoid hemorrhage involving the anterior left frontotemporal region and left sylvian fissure again seen. Small volume subarachnoid blood overlies the posterior left parietal convexity as well. Overall appearance is stable from prior CT as well. Probable trace scattered subarachnoid blood seen elsewhere within the brain on SWI sequence, likely related to redistribution. Trace susceptibility artifact within the occipital horn of the right lateral ventricle also likely related to recirculation. No other foci of susceptibility artifact to suggest acute or chronic intracranial hemorrhage elsewhere within the brain. No other diffusion abnormality to suggest acute or subacute ischemia. Gray-white matter differentiation otherwise maintained. No mass lesion or midline shift. No hydrocephalus. Small subdural  hygromas/effusions overlie the bilateral cerebral convexities measuring up to approximately 2 mm bilaterally. Pituitary gland suprasellar region within normal limits. Midline structures intact. Vascular: Loss of normal flow void within the left ICA to the level of the terminus, consistent with known chronic left ICA occlusion. Major intracranial vascular flow voids are otherwise maintained. Skull and upper cervical spine: Craniocervical junction within normal limits. Degenerative spondylosis at C3-4 with resultant mild to moderate spinal stenosis (series 9, image 14). Bone marrow signal intensity heterogeneous without focal marrow replacing lesion. No scalp soft tissue abnormality. Sinuses/Orbits: Globes and orbital soft tissues within normal limits. Scattered mucosal thickening noted within the ethmoidal air cells and maxillary sinuses. No mastoid effusion. Inner ear structures grossly normal. Other: None. MRA HEAD FINDINGS Anterior circulation: Distal cervical  segment of the right internal carotid artery patent with antegrade flow. Petrous right ICA patent. Atheromatous irregularity within the right carotid siphon with no more than mild multifocal narrowing. The cervical left ICA is occluded. Distal reconstitution with attenuated flow within the supraclinoid segment, likely collateral across the circle-of-Willis. Left M1 segment attenuated but patent. No visible aneurysm about the left MCA bifurcation. Distal left MCA branches attenuated but perfused distally. A1 segments patent bilaterally. Normal anterior communicating artery complex. Anterior cerebral arteries patent to their distal aspects without stenosis. Right M1 segment patent. Normal right MCA bifurcation. Distal right MCA branches well perfused. No aneurysm or other vascular abnormality about the anterior circulation. Posterior circulation: Visualized distal V4 segments patent without stenosis. Left PICA origin patent and normal. Right PICA not seen. Basilar patent to its distal aspect without stenosis. Superior cerebellar arteries patent bilaterally. Left PCA primarily supplied via the basilar. Predominant fetal type origin of the right PCA. Both PCAs well perfused to their distal aspects. Distal small vessel atheromatous irregularity seen throughout the intracranial circulation. Anatomic variants: Fetal type origin of the right PCA. MRA NECK FINDINGS Aortic arch: Visualized aortic arch normal caliber with normal branch pattern. No stenosis. Right carotid system: Right CCA patent without stenosis. Mild atheromatous irregularity about the right carotid bulb/proximal right ICA without significant stenosis. Right ICA patent distally without stenosis, evidence for dissection or occlusion. Left carotid system: Left CCA patent without stenosis. Occlusion of the left ICA at its origin, stable from previous. Left ICA remains occluded within the neck. Left external carotid artery and its branches remain perfused.  Vertebral arteries: Both vertebral arteries arise from the subclavian arteries. No proximal subclavian artery stenosis. Short-segment mild-to-moderate stenosis at the origin of the left vertebral artery noted (series 1029, image 8). Vertebral arteries otherwise patent without stenosis, evidence for dissection or occlusion. Other: None IMPRESSION: MRI HEAD: 1. 2.1 cm acute intraparenchymal hemorrhage positioned at the posterior right parietal convexity, stable from previous CT. Mild surrounding edema without significant regional mass effect or midline shift. Etiology of this hemorrhage is unclear, with no underlying mass or other abnormality evident on this noncontrast examination. 2. Additional scattered small volume subarachnoid hemorrhage, predominantly localized at the left sylvian fissure, also stable from prior. 3. Superimposed small bilateral subdural hygromas/effusions without significant mass effect or midline shift. 4. Underlying age-related cerebral atrophy with mild chronic small vessel ischemic disease. MRA HEAD: 1. 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 Distal reconstitution at the supraclinoid segment with attenuated but patent flow throughout the left MCA distribution. Appearance is relatively similar to prior CTA from 2019. 2. Otherwise stable patency of the intracranial arterial circulation. No hemodynamically significant or correctable stenosis. 3. No vascular abnormality to explain the acute intracranial  hemorrhage is identified. MRA NECK: 1. Occlusion of the left ICA at its origin, stable from prior. 2. Atheromatous change about the right carotid bifurcation without hemodynamically significant stenosis. Right carotid system remains widely patent within the neck. 3. Short-segment mild-to-moderate stenosis at the origin of the left vertebral artery. Otherwise wide patency of both vertebral arteries within the neck. Electronically Signed   By: Jeannine Boga M.D.   On: 12/03/2020 04:27     EKG: Independently reviewed.  Sinus rhythm at 68 bpm with first-degree heart block, RBBB, and and QTC 478  Assessment/Plan Traumatic intracranial bleed: Patient presents with complaints of headache and double vision after hitting his head on a bar while at the gym5 days ago.  Initial CT scan of the brain significant for a 1.8 x 1.1 cm posterior right parietal intraparenchymal hemorrhage with surrounding vasogenic edema and small left subarachnoid hemorrhage of the left MCA cistern.  Neurosurgery and neurology were consulted.  No acute surgical intervention needed as symptoms and appear to be stable.  Repeat imaging including repeat CT, MRI brain, MRA of the head and neck stable. -Admit to a progressive bed -Neurochecks -Goal systolic blood pressure < 160 -Hold aspirin -PT to evaluate and treat -Appreciate neurology consultative services.   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  The elevated BUN to creatinine ratio suggest prerenal cause of symptoms.  Baseline creatinine previously noted to be around -1.3 reviewing labs from 2 months ago.  This is greater than a 0.3 increase from baseline to suggest acute kidney injury.  Patient is on diuretics at baseline which is likely contributing, but also question possibility of obstruction given some lower abdominal tenderness on physical exam.  Urinalysis did not show any signs of infection.  Patient has been started on lactated Ringer's at 125 mL/h. -Continue IV fluids at current rate -Check postvoid residual -Hold nephrotoxic agents -Continue to monitor kidney function daily  Hypokalemia: Potassium 3.2 from initial labs obtained yesterday.  Likely related with patient being on diuretics. -Give 40 mEq of potassium chloride p.o. -Continue to monitor and replace as needed  Essential hypertension: On admission blood pressures were initially noted to be  elevated up to 161/84.  Home blood pressure medication regimen includes hydrochlorothiazide 12.5 mg daily, lisinopril 40 mg daily, and spironolactone 25 mg daily.  He last took these medications yesterday. -Held home medication regimen due to AKI -Labetalol IV/hydralazine as needed for systolic blood pressures greater than 1 6  Left internal carotid artery occlusion: Chronic.  Noted to be stable on MRA of head and neck.  BPH: Patient notes that Flomax is one of his home med's although not initially noted on med reconciliation done by pharmacy.   -Will evaluate for possible urinary retention as a cause of AKI. -Continue Flomax  History of prostate cancer: Patient reports he had received radiation treatment and that PSA was noted to be 0.28 about a month ago on last check.   -Continue outpatient follow-up  DVT prophylaxis: SCDs Code Status: Full Family Communication: Wife updated over the phone Disposition Plan: Home Consults called: Neurology, neurosurgery  Admission status: Observation  Norval Morton MD [facility]   If 7PM-7AM, please contact night-coverage   12/03/2020, 7:29 AM

## 2020-12-03 NOTE — ED Notes (Signed)
Patient transported to CT 

## 2020-12-03 NOTE — ED Notes (Signed)
Patient transported to MRI 

## 2020-12-03 NOTE — ED Notes (Addendum)
Pt transferred by [place] from [facility] ED with a subarachnoid hemorrhage in need of an  MRI. Pt axo x4, VSS at this time. LR running at 165mL/hr

## 2020-12-03 NOTE — Progress Notes (Addendum)
STROKE TEAM PROGRESS NOTE   ATTENDING NOTE: I reviewed above note and agree with the assessment and plan. Pt was seen and examined.   75 year old male with history of chronic left carotid occlusion, hypertension, prostate cancer admitted for headache, nausea, dizziness and transient diplopia post mild head trauma.  CT right high convexity small ICH with left superficial small SAH.  CT repeat stable ICH and SAH.  MRI confirmed the ICH and SAH.  MRA head and neck showed chronic left ICA occlusion, otherwise unremarkable.  A1c 6.7, LDL 91 in 09/2020.  UDS negative.  Creatinine 1.38-> 2.20.  2D echo pending.  On examination, patient neurologically intact, no focal deficits, able to walk without difficulty in the room.  Still has mild tenderness at scalp posterior top region.   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  Hold off antiplatelet for now, may consider restart home aspirin 81 once blood observed.  BP goal less than 160 given ICH.  Currently on LR @ 125 IV fluid for AKI treatment.  PT/OT pending.  For detailed assessment and plan, please refer to above as I have made changes wherever appropriate.   Rosalin Hawking, MD PhD Stroke Neurology 12/03/2020 3:06 PM    INTERVAL HISTORY No one is at the bedside at time of this exam. Patient appears intact. No complaints of headaches or visual changes.  CT head done today shows stable  subacute IPH in posterior right parietal convexity with surrounding vasogenic edema, as well as stable, small volume of SAH at left sylvian fissure.    Vitals:   12/03/20 0642 12/03/20 0753 12/03/20 0948 12/03/20 1204  BP: 134/74 121/87 129/71 (!) 147/76  Pulse: 84 82 66 73  Resp: (!) 24 14 18 19   Temp:  98.6 F (37 C) 97.6 F (36.4 C) 97.7 F (36.5 C)  TempSrc:  Temporal Oral Temporal  SpO2: 99% 96% 99%    CBC:  Recent Labs  Lab 12/02/20 1702  WBC 7.9  NEUTROABS 6.2  HGB 14.1  HCT 42.2  MCV  88.3  PLT 701   Basic Metabolic Panel:  Recent Labs  Lab 12/02/20 1702 12/03/20 0828  NA 137 135  K 3.2* 3.0*  CL 103 104  CO2 21* 21*  GLUCOSE 117* 141*  BUN 48* 38*  CREATININE 2.20* 1.47*  CALCIUM 9.6 9.4  MG  --  2.1    Lipid Panel: No results for input(s): CHOL, TRIG, HDL, CHOLHDL, VLDL, LDLCALC in the last 168 hours.  HgbA1c: No results for input(s): HGBA1C in the last 168 hours. Urine Drug Screen:  Recent Labs  Lab 12/02/20 1814  LABOPIA NONE DETECTED  COCAINSCRNUR NONE DETECTED  LABBENZ NONE DETECTED  AMPHETMU NONE DETECTED  THCU NONE DETECTED  LABBARB NONE DETECTED    Alcohol Level  Recent Labs  Lab 12/02/20 1814  ETH <10    IMAGING past 24 hours CT Head Wo Contrast  Result Date: 12/03/2020 CLINICAL DATA:  75 year old male with history of stroke. Follow-up study. EXAM: CT HEAD WITHOUT CONTRAST TECHNIQUE: Contiguous axial images were obtained from the base of the skull through the vertex without intravenous contrast. COMPARISON:  MRI brain 12/03/2020.  Head CT 12/02/2020. FINDINGS: Brain: High attenuation in the high right parietal region involving white matter, subcortical and cortical gray matter (axial image 24 of series 2 and coronal image 47 of series 4), similar to the prior study, currently measuring 1.9 x 1.2 x 2.1 cm  with surrounding low attenuation compatible with vasogenic edema, exerting minimal local mass effect without associated midline shift. Scattered extra-axial high attenuation is also noted overlying the left cerebral hemisphere, most evident in the region of the anterior aspect of the left sylvian fissure and left frontal region, similar to the prior examination. No definite intraventricular hemorrhage. No hydrocephalus. No midline shift or other findings to suggest herniation. Mild cerebral atrophy. Patchy and confluent areas of decreased attenuation are noted throughout the deep and periventricular white matter of the cerebral hemispheres  bilaterally, compatible with chronic microvascular ischemic disease. Expansion of the subdural spaces bilaterally (left greater than right), similar to the prior examination, suspicious for probable hygromas. Vascular: Vascular calcifications are noted in the carotid siphons bilaterally. Vascular calcifications of the vertebral arteries bilaterally. Skull: Normal. Negative for fracture or focal lesion. Sinuses/Orbits: Mucoperiosteal thickening in the right maxillary sinus. No air-fluid levels. Other: None. IMPRESSION: 1. Subacute intraparenchymal hemorrhage in the posterior right parietal convexity with surrounding vasogenic edema, similar to prior examinations, as above. 2. Small volume of subarachnoid hemorrhage most evident in the left sylvian fissure and left frontal region, similar to the prior examination. 3. No intraventricular hemorrhage or hydrocephalus. 4. Probable bilateral small subdural hygromas. 5. Mild cerebral atrophy with chronic microvascular ischemic changes in the cerebral white matter, as above. Electronically Signed   By: Vinnie Langton M.D.   On: 12/03/2020 05:58   CT HEAD WO CONTRAST  Result Date: 12/02/2020 CLINICAL DATA:  Double vision, possible TIA EXAM: CT HEAD WITHOUT CONTRAST TECHNIQUE: Contiguous axial images were obtained from the base of the skull through the vertex without intravenous contrast. COMPARISON:  None. FINDINGS: Brain: There is a 1.8 x 1.1 cm area of parenchymal hemorrhage in the right superior parietal lobule with mild surrounding edema. Minimal subdural hemorrhage is present along the falx and tentorium. Small volume subarachnoid hemorrhage is present along the left MCA cistern extending into the anterior sylvian fissure and adjacent frontal cortical sulci. Minimal additional scattered sulcal subarachnoid hemorrhage is likely present. Suspect thin bilateral convexity subdural hygromas. Gray-white differentiation is preserved. There is no significant mass effect.  No hydrocephalus. Vascular: There is atherosclerotic calcification at the skull base. Skull: Calvarium is unremarkable. Sinuses/Orbits: Mild paranasal sinus mucosal thickening. Orbits are unremarkable. Other: None. IMPRESSION: Small volume parenchymal, subdural, and subarachnoid hemorrhage as detailed above. Suspected bilateral cerebral convexity subdural hygromas. No significant mass effect. Vascular imaging and contrast enhanced MRI evaluation are recommended. These results were called by telephone at the time of interpretation on 12/02/2020 at 6:47 pm to provider [facility] , who verbally acknowledged these results. Electronically Signed   By: Macy Mis M.D.   On: 12/02/2020 18:53   MR ANGIO HEAD WO CONTRAST  Result Date: 12/03/2020 CLINICAL DATA:  Follow-up examination for acute intracranial hemorrhage. EXAM: MRI HEAD WITHOUT CONTRAST MRA HEAD WITHOUT CONTRAST MRA NECK WITHOUT AND WITH CONTRAST TECHNIQUE: Multiplanar, multi-echo pulse sequences of the brain and surrounding structures were acquired without intravenous contrast. Angiographic images of the Circle of Willis were acquired using MRA technique without intravenous contrast. Angiographic images of the neck were acquired using MRA technique without and with intravenous contrast. Carotid stenosis measurements (when applicable) are obtained utilizing NASCET criteria, using the distal internal carotid diameter as the denominator. CONTRAST:  84mL GADAVIST GADOBUTROL 1 MMOL/ML IV SOLN COMPARISON:  Comparison made with prior CT from 12/02/2020. FINDINGS: MRI HEAD FINDINGS Brain: Moderately advanced age-related cerebral atrophy. Scattered patchy T2/FLAIR hyperintensity within the periventricular and deep white matter both  cerebral hemispheres most consistent with chronic small vessel ischemic disease, overall mild for age. Previously identified intraparenchymal hemorrhage positioned at the posterior right parietal convexity again seen, relatively  stable in size measuring 1.8 x 0.9 x 2.1 cm (estimated volume 1.7 mL). Mild surrounding vasogenic edema without significant regional mass effect or midline shift. No visible underlying lesion seen on this noncontrast examination. Additional scattered small volume subarachnoid hemorrhage involving the anterior left frontotemporal region and left sylvian fissure again seen. Small volume subarachnoid blood overlies the posterior left parietal convexity as well. Overall appearance is stable from prior CT as well. Probable trace scattered subarachnoid blood seen elsewhere within the brain on SWI sequence, likely related to redistribution. Trace susceptibility artifact within the occipital horn of the right lateral ventricle also likely related to recirculation. No other foci of susceptibility artifact to suggest acute or chronic intracranial hemorrhage elsewhere within the brain. No other diffusion abnormality to suggest acute or subacute ischemia. Gray-white matter differentiation otherwise maintained. No mass lesion or midline shift. No hydrocephalus. Small subdural hygromas/effusions overlie the bilateral cerebral convexities measuring up to approximately 2 mm bilaterally. Pituitary gland suprasellar region within normal limits. Midline structures intact. Vascular: Loss of normal flow void within the left ICA to the level of the terminus, consistent with known chronic left ICA occlusion. Major intracranial vascular flow voids are otherwise maintained. Skull and upper cervical spine: Craniocervical junction within normal limits. Degenerative spondylosis at C3-4 with resultant mild to moderate spinal stenosis (series 9, image 14). Bone marrow signal intensity heterogeneous without focal marrow replacing lesion. No scalp soft tissue abnormality. Sinuses/Orbits: Globes and orbital soft tissues within normal limits. Scattered mucosal thickening noted within the ethmoidal air cells and maxillary sinuses. No mastoid  effusion. Inner ear structures grossly normal. Other: None. MRA HEAD FINDINGS Anterior circulation: Distal cervical segment of the right internal carotid artery patent with antegrade flow. Petrous right ICA patent. Atheromatous irregularity within the right carotid siphon with no more than mild multifocal narrowing. The cervical left ICA is occluded. Distal reconstitution with attenuated flow within the supraclinoid segment, likely collateral across the circle-of-Willis. Left M1 segment attenuated but patent. No visible aneurysm about the left MCA bifurcation. Distal left MCA branches attenuated but perfused distally. A1 segments patent bilaterally. Normal anterior communicating artery complex. Anterior cerebral arteries patent to their distal aspects without stenosis. Right M1 segment patent. Normal right MCA bifurcation. Distal right MCA branches well perfused. No aneurysm or other vascular abnormality about the anterior circulation. Posterior circulation: Visualized distal V4 segments patent without stenosis. Left PICA origin patent and normal. Right PICA not seen. Basilar patent to its distal aspect without stenosis. Superior cerebellar arteries patent bilaterally. Left PCA primarily supplied via the basilar. Predominant fetal type origin of the right PCA. Both PCAs well perfused to their distal aspects. Distal small vessel atheromatous irregularity seen throughout the intracranial circulation. Anatomic variants: Fetal type origin of the right PCA. MRA NECK FINDINGS Aortic arch: Visualized aortic arch normal caliber with normal branch pattern. No stenosis. Right carotid system: Right CCA patent without stenosis. Mild atheromatous irregularity about the right carotid bulb/proximal right ICA without significant stenosis. Right ICA patent distally without stenosis, evidence for dissection or occlusion. Left carotid system: Left CCA patent without stenosis. Occlusion of the left ICA at its origin, stable from  previous. Left ICA remains occluded within the neck. Left external carotid artery and its branches remain perfused. Vertebral arteries: Both vertebral arteries arise from the subclavian arteries. No proximal subclavian artery stenosis. Short-segment  mild-to-moderate stenosis at the origin of the left vertebral artery noted (series 1029, image 8). Vertebral arteries otherwise patent without stenosis, evidence for dissection or occlusion. Other: None IMPRESSION: MRI HEAD: 1. 2.1 cm acute intraparenchymal hemorrhage positioned at the posterior right parietal convexity, stable from previous CT. Mild surrounding edema without significant regional mass effect or midline shift. Etiology of this hemorrhage is unclear, with no underlying mass or other abnormality evident on this noncontrast examination. 2. Additional scattered small volume subarachnoid hemorrhage, predominantly localized at the left sylvian fissure, also stable from prior. 3. Superimposed small bilateral subdural hygromas/effusions without significant mass effect or midline shift. 4. Underlying age-related cerebral atrophy with mild chronic small vessel ischemic disease. MRA HEAD: 1. 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 Distal reconstitution at the supraclinoid segment with attenuated but patent flow throughout the left MCA distribution. Appearance is relatively similar to prior CTA from 2019. 2. Otherwise stable patency of the intracranial arterial circulation. No hemodynamically significant or correctable stenosis. 3. No vascular abnormality to explain the acute intracranial hemorrhage is identified. MRA NECK: 1. Occlusion of the left ICA at its origin, stable from prior. 2. Atheromatous change about the right carotid bifurcation without hemodynamically significant stenosis. Right carotid system remains widely patent within the neck. 3. Short-segment mild-to-moderate stenosis at the origin of the left vertebral artery. Otherwise wide patency of both  vertebral arteries within the neck. Electronically Signed   By: Jeannine Boga M.D.   On: 12/03/2020 04:27   MR Angiogram Neck W or Wo Contrast  Result Date: 12/03/2020 CLINICAL DATA:  Follow-up examination for acute intracranial hemorrhage. EXAM: MRI HEAD WITHOUT CONTRAST MRA HEAD WITHOUT CONTRAST MRA NECK WITHOUT AND WITH CONTRAST TECHNIQUE: Multiplanar, multi-echo pulse sequences of the brain and surrounding structures were acquired without intravenous contrast. Angiographic images of the Circle of Willis were acquired using MRA technique without intravenous contrast. Angiographic images of the neck were acquired using MRA technique without and with intravenous contrast. Carotid stenosis measurements (when applicable) are obtained utilizing NASCET criteria, using the distal internal carotid diameter as the denominator. CONTRAST:  73mL GADAVIST GADOBUTROL 1 MMOL/ML IV SOLN COMPARISON:  Comparison made with prior CT from 12/02/2020. FINDINGS: MRI HEAD FINDINGS Brain: Moderately advanced age-related cerebral atrophy. Scattered patchy T2/FLAIR hyperintensity within the periventricular and deep white matter both cerebral hemispheres most consistent with chronic small vessel ischemic disease, overall mild for age. Previously identified intraparenchymal hemorrhage positioned at the posterior right parietal convexity again seen, relatively stable in size measuring 1.8 x 0.9 x 2.1 cm (estimated volume 1.7 mL). Mild surrounding vasogenic edema without significant regional mass effect or midline shift. No visible underlying lesion seen on this noncontrast examination. Additional scattered small volume subarachnoid hemorrhage involving the anterior left frontotemporal region and left sylvian fissure again seen. Small volume subarachnoid blood overlies the posterior left parietal convexity as well. Overall appearance is stable from prior CT as well. Probable trace scattered subarachnoid blood seen elsewhere within  the brain on SWI sequence, likely related to redistribution. Trace susceptibility artifact within the occipital horn of the right lateral ventricle also likely related to recirculation. No other foci of susceptibility artifact to suggest acute or chronic intracranial hemorrhage elsewhere within the brain. No other diffusion abnormality to suggest acute or subacute ischemia. Gray-white matter differentiation otherwise maintained. No mass lesion or midline shift. No hydrocephalus. Small subdural hygromas/effusions overlie the bilateral cerebral convexities measuring up to approximately 2 mm bilaterally. Pituitary gland suprasellar region within normal limits. Midline structures intact. Vascular: Loss  of normal flow void within the left ICA to the level of the terminus, consistent with known chronic left ICA occlusion. Major intracranial vascular flow voids are otherwise maintained. Skull and upper cervical spine: Craniocervical junction within normal limits. Degenerative spondylosis at C3-4 with resultant mild to moderate spinal stenosis (series 9, image 14). Bone marrow signal intensity heterogeneous without focal marrow replacing lesion. No scalp soft tissue abnormality. Sinuses/Orbits: Globes and orbital soft tissues within normal limits. Scattered mucosal thickening noted within the ethmoidal air cells and maxillary sinuses. No mastoid effusion. Inner ear structures grossly normal. Other: None. MRA HEAD FINDINGS Anterior circulation: Distal cervical segment of the right internal carotid artery patent with antegrade flow. Petrous right ICA patent. Atheromatous irregularity within the right carotid siphon with no more than mild multifocal narrowing. The cervical left ICA is occluded. Distal reconstitution with attenuated flow within the supraclinoid segment, likely collateral across the circle-of-Willis. Left M1 segment attenuated but patent. No visible aneurysm about the left MCA bifurcation. Distal left MCA  branches attenuated but perfused distally. A1 segments patent bilaterally. Normal anterior communicating artery complex. Anterior cerebral arteries patent to their distal aspects without stenosis. Right M1 segment patent. Normal right MCA bifurcation. Distal right MCA branches well perfused. No aneurysm or other vascular abnormality about the anterior circulation. Posterior circulation: Visualized distal V4 segments patent without stenosis. Left PICA origin patent and normal. Right PICA not seen. Basilar patent to its distal aspect without stenosis. Superior cerebellar arteries patent bilaterally. Left PCA primarily supplied via the basilar. Predominant fetal type origin of the right PCA. Both PCAs well perfused to their distal aspects. Distal small vessel atheromatous irregularity seen throughout the intracranial circulation. Anatomic variants: Fetal type origin of the right PCA. MRA NECK FINDINGS Aortic arch: Visualized aortic arch normal caliber with normal branch pattern. No stenosis. Right carotid system: Right CCA patent without stenosis. Mild atheromatous irregularity about the right carotid bulb/proximal right ICA without significant stenosis. Right ICA patent distally without stenosis, evidence for dissection or occlusion. Left carotid system: Left CCA patent without stenosis. Occlusion of the left ICA at its origin, stable from previous. Left ICA remains occluded within the neck. Left external carotid artery and its branches remain perfused. Vertebral arteries: Both vertebral arteries arise from the subclavian arteries. No proximal subclavian artery stenosis. Short-segment mild-to-moderate stenosis at the origin of the left vertebral artery noted (series 1029, image 8). Vertebral arteries otherwise patent without stenosis, evidence for dissection or occlusion. Other: None IMPRESSION: MRI HEAD: 1. 2.1 cm acute intraparenchymal hemorrhage positioned at the posterior right parietal convexity, stable from  previous CT. Mild surrounding edema without significant regional mass effect or midline shift. Etiology of this hemorrhage is unclear, with no underlying mass or other abnormality evident on this noncontrast examination. 2. Additional scattered small volume subarachnoid hemorrhage, predominantly localized at the left sylvian fissure, also stable from prior. 3. Superimposed small bilateral subdural hygromas/effusions without significant mass effect or midline shift. 4. Underlying age-related cerebral atrophy with mild chronic small vessel ischemic disease. MRA HEAD: 1. 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 Distal reconstitution at the supraclinoid segment with attenuated but patent flow throughout the left MCA distribution. Appearance is relatively similar to prior CTA from 2019. 2. Otherwise stable patency of the intracranial arterial circulation. No hemodynamically significant or correctable stenosis. 3. No vascular abnormality to explain the acute intracranial hemorrhage is identified. MRA NECK: 1. Occlusion of the left ICA at its origin, stable from prior. 2. Atheromatous change about the right carotid bifurcation  without hemodynamically significant stenosis. Right carotid system remains widely patent within the neck. 3. Short-segment mild-to-moderate stenosis at the origin of the left vertebral artery. Otherwise wide patency of both vertebral arteries within the neck. Electronically Signed   By: Jeannine Boga M.D.   On: 12/03/2020 04:27   MR BRAIN WO CONTRAST  Result Date: 12/03/2020 CLINICAL DATA:  Follow-up examination for acute intracranial hemorrhage. EXAM: MRI HEAD WITHOUT CONTRAST MRA HEAD WITHOUT CONTRAST MRA NECK WITHOUT AND WITH CONTRAST TECHNIQUE: Multiplanar, multi-echo pulse sequences of the brain and surrounding structures were acquired without intravenous contrast. Angiographic images of the Circle of Willis were acquired using MRA technique without intravenous contrast.  Angiographic images of the neck were acquired using MRA technique without and with intravenous contrast. Carotid stenosis measurements (when applicable) are obtained utilizing NASCET criteria, using the distal internal carotid diameter as the denominator. CONTRAST:  63mL GADAVIST GADOBUTROL 1 MMOL/ML IV SOLN COMPARISON:  Comparison made with prior CT from 12/02/2020. FINDINGS: MRI HEAD FINDINGS Brain: Moderately advanced age-related cerebral atrophy. Scattered patchy T2/FLAIR hyperintensity within the periventricular and deep white matter both cerebral hemispheres most consistent with chronic small vessel ischemic disease, overall mild for age. Previously identified intraparenchymal hemorrhage positioned at the posterior right parietal convexity again seen, relatively stable in size measuring 1.8 x 0.9 x 2.1 cm (estimated volume 1.7 mL). Mild surrounding vasogenic edema without significant regional mass effect or midline shift. No visible underlying lesion seen on this noncontrast examination. Additional scattered small volume subarachnoid hemorrhage involving the anterior left frontotemporal region and left sylvian fissure again seen. Small volume subarachnoid blood overlies the posterior left parietal convexity as well. Overall appearance is stable from prior CT as well. Probable trace scattered subarachnoid blood seen elsewhere within the brain on SWI sequence, likely related to redistribution. Trace susceptibility artifact within the occipital horn of the right lateral ventricle also likely related to recirculation. No other foci of susceptibility artifact to suggest acute or chronic intracranial hemorrhage elsewhere within the brain. No other diffusion abnormality to suggest acute or subacute ischemia. Gray-white matter differentiation otherwise maintained. No mass lesion or midline shift. No hydrocephalus. Small subdural hygromas/effusions overlie the bilateral cerebral convexities measuring up to approximately  2 mm bilaterally. Pituitary gland suprasellar region within normal limits. Midline structures intact. Vascular: Loss of normal flow void within the left ICA to the level of the terminus, consistent with known chronic left ICA occlusion. Major intracranial vascular flow voids are otherwise maintained. Skull and upper cervical spine: Craniocervical junction within normal limits. Degenerative spondylosis at C3-4 with resultant mild to moderate spinal stenosis (series 9, image 14). Bone marrow signal intensity heterogeneous without focal marrow replacing lesion. No scalp soft tissue abnormality. Sinuses/Orbits: Globes and orbital soft tissues within normal limits. Scattered mucosal thickening noted within the ethmoidal air cells and maxillary sinuses. No mastoid effusion. Inner ear structures grossly normal. Other: None. MRA HEAD FINDINGS Anterior circulation: Distal cervical segment of the right internal carotid artery patent with antegrade flow. Petrous right ICA patent. Atheromatous irregularity within the right carotid siphon with no more than mild multifocal narrowing. The cervical left ICA is occluded. Distal reconstitution with attenuated flow within the supraclinoid segment, likely collateral across the circle-of-Willis. Left M1 segment attenuated but patent. No visible aneurysm about the left MCA bifurcation. Distal left MCA branches attenuated but perfused distally. A1 segments patent bilaterally. Normal anterior communicating artery complex. Anterior cerebral arteries patent to their distal aspects without stenosis. Right M1 segment patent. Normal right MCA bifurcation. Distal right  MCA branches well perfused. No aneurysm or other vascular abnormality about the anterior circulation. Posterior circulation: Visualized distal V4 segments patent without stenosis. Left PICA origin patent and normal. Right PICA not seen. Basilar patent to its distal aspect without stenosis. Superior cerebellar arteries patent  bilaterally. Left PCA primarily supplied via the basilar. Predominant fetal type origin of the right PCA. Both PCAs well perfused to their distal aspects. Distal small vessel atheromatous irregularity seen throughout the intracranial circulation. Anatomic variants: Fetal type origin of the right PCA. MRA NECK FINDINGS Aortic arch: Visualized aortic arch normal caliber with normal branch pattern. No stenosis. Right carotid system: Right CCA patent without stenosis. Mild atheromatous irregularity about the right carotid bulb/proximal right ICA without significant stenosis. Right ICA patent distally without stenosis, evidence for dissection or occlusion. Left carotid system: Left CCA patent without stenosis. Occlusion of the left ICA at its origin, stable from previous. Left ICA remains occluded within the neck. Left external carotid artery and its branches remain perfused. Vertebral arteries: Both vertebral arteries arise from the subclavian arteries. No proximal subclavian artery stenosis. Short-segment mild-to-moderate stenosis at the origin of the left vertebral artery noted (series [id number], image 8). Vertebral arteries otherwise patent without stenosis, evidence for dissection or occlusion. Other: None IMPRESSION: MRI HEAD: 1. 2.1 cm acute intraparenchymal hemorrhage positioned at the posterior right parietal convexity, stable from previous CT. Mild surrounding edema without significant regional mass effect or midline shift. Etiology of this hemorrhage is unclear, with no underlying mass or other abnormality evident on this noncontrast examination. 2. Additional scattered small volume subarachnoid hemorrhage, predominantly localized at the left sylvian fissure, also stable from prior. 3. Superimposed small bilateral subdural hygromas/effusions without significant mass effect or midline shift. 4. Underlying age-related cerebral atrophy with mild chronic small vessel ischemic disease. MRA HEAD: 1. 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 Distal reconstitution at the supraclinoid segment with attenuated but patent flow throughout the left MCA distribution. Appearance is relatively similar to prior CTA from 2019. 2. Otherwise stable patency of the intracranial arterial circulation. No hemodynamically significant or correctable stenosis. 3. No vascular abnormality to explain the acute intracranial hemorrhage is identified. MRA NECK: 1. Occlusion of the left ICA at its origin, stable from prior. 2. Atheromatous change about the right carotid bifurcation without hemodynamically significant stenosis. Right carotid system remains widely patent within the neck. 3. Short-segment mild-to-moderate stenosis at the origin of the left vertebral artery. Otherwise wide patency of both vertebral arteries within the neck. Electronically Signed   By: Jeannine Boga M.D.   On: 12/03/2020 04:27    PHYSICAL EXAM: pt seen in bed, appears in no distress.   Neuro: Mental Status: Patient is awake, alert, oriented to person, place, month, year, and situation. Patient is able to give a clear and coherent history. No signs of aphasia or neglect Cranial Nerves: II: Visual Fields are full. Pupils are equal, round, and reactive to light.   III,IV, VI: EOMI without ptosis or diploplia.  V: Facial sensation is symmetric to temperature VII: Facial movement is symmetric.  VIII: hearing is intact to voice X: Uvula elevates symmetrically XI: Shoulder shrug is symmetric. XII: tongue is midline without atrophy or fasciculations.  Motor: Tone is normal. Bulk is normal. 5/5 strength was present in all four extremities.  Sensory: Sensation is symmetric to light touch and temperature in the arms and legs. Cerebellar: No clear ataxia  ASSESSMENT/PLAN Mr. SHAQUILE LUTZE is a 75 y.o. male  with minor head trauma  on Sunday and development of symptoms on Monday with CT most suggestive of traumatic coup/contrecoup injury developed  transient diplopia on admission that we felt further investigation with an MRI would be helpful to rule out other causes such as multifocal emboli with hemorrhagic conversion or metastatic disease, etc. Neuroimaging reveals right IPH and left SAH. No IVH.   Traumatic ICH and SAH - right high convexity small IPH and left sylvian fissure small SAH from trauma.   CT head: admission:  Small volume parenchymal, subdural, and subarachnoid hemorrhage as detailed above. Suspected bilateral cerebral convexity subdural hygromas. No significant mass effect. Vascular imaging and contrast. enhanced MRI evaluation are recommended. MRI BRAIN without contrast:  1. 2.1 cm acute intraparenchymal hemorrhage positioned at the posterior right parietal convexity, stable from previous CT. Mild surrounding edema without significant regional mass effect or midline shift. Etiology of this hemorrhage is unclear, with no underlying mass or other abnormality evident on this noncontrast examination. 2. Additional scattered small volume subarachnoid hemorrhage, predominantly localized at the left sylvian fissure, also stable from prior. 3. Superimposed small bilateral subdural hygromas/effusions without significant mass effect or midline shift. 4. Underlying age-related cerebral atrophy with mild chronic small vessel ischemic disease. MRA  HEAD:  1. 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 Distal reconstitution at the supraclinoid segment with attenuated but patent flow throughout the left MCA distribution. Appearance is relatively similar to prior CTA from 2019. 2. Otherwise stable patency of the intracranial arterial circulation. No hemodynamically significant or correctable stenosis. 3. No vascular abnormality to explain the acute intracranial hemorrhage is identified.  MRA NECK:  1. Occlusion of the left ICA at its origin, stable from prior. 2. Atheromatous change about the right carotid bifurcation  without hemodynamically significant stenosis. Right carotid system remains widely patent within the neck. 3. Short-segment mild-to-moderate stenosis at the origin of the left vertebral artery. Otherwise wide patency of both vertebral arteries within the neck.  2D Echo done and results are pending    LDL 91 HgbA1c 6.7 VTE prophylaxis - scd    Diet   Diet Heart Room service appropriate? Yes; Fluid consistency: Thin   aspirin 81 mg daily prior to admission, now on no antithrombotics d/t IPH.Marland Kitchen   Therapy recommendations:  pending Disposition:  pending   Hypertension Home meds:  hctz 12.5mg , lisinopril 40mg  daily, aldactone Stable Permissive hypertension (OK if < 220/120) but gradually normalize in 5-7 days Long-term BP goal normotensive  Hyperlipidemia Home meds:  none LDL 91, goal < 70 Add lipitor  High intensity statin  Continue statin at discharge   HgbA1c 6.7, goal < 7.0 CBGs No results for input(s): GLUCAP in the last 72 hours.  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 After hours, contact General Neurology

## 2020-12-03 NOTE — Progress Notes (Signed)
  Echocardiogram 2D Echocardiogram has been performed.  Adam Gonzales F 12/03/2020, 11:55 AM

## 2020-12-03 NOTE — ED Provider Notes (Signed)
Patient arrived in transfer from [place].  Briefly the patient has been having headaches for about a week with some nausea.  Started having double vision while driving today and went to his doctor's office and was sent to the emergency department.  Found to have a subarachnoid hemorrhage and was sent here for MRI MRA.  On repeat assessment patient without any significant symptoms.  He is mildly hypertensive.  I started him on Cleviprex after reading the notes that his goal blood pressure was 352 systolic as he had multiple blood pressures in the 150s and 1 in the 160s.  I discussed the case with Dr. Leonel Ramsay, neurology he felt that at 160 was a reasonable goal for this patient and suggested stopping the clevidipine.  Recommended labetalol doses if needed.  Recommended medical observation with repeat head CT in the morning MRI MRA.   Deno Etienne, DO 12/06/20 [phone]

## 2020-12-03 NOTE — ED Notes (Signed)
ED Provider at bedside. 

## 2020-12-03 NOTE — Consult Note (Addendum)
Neurology Consultation Reason for Consult: [place] Referring Physician: Tyrone Nine, D  CC: Double vision  History is obtained from: Patient  HPI: Adam Gonzales is a 75 y.o. male presents normal state of health until Sunday.  He was at the gym and then had the bar still on the bench and went to sit down thinking his head was going to miss it but whacked his head against the bar.  He did not lose consciousness or have any immediate symptoms other than direct pain from where he hit it.  For the rest of the day, however, he had significant headache.  He began having nausea and feeling "off" on Monday.  He has been feeling slightly dizzy, but has a hard time saying exactly what was "off."  He had been concerned that he had possibly gotten sick from one of his work mates.  Today, he was driving and he noticed that he was having some double vision and therefore presented to the emergency department where head CT was performed.  It shows a left parietal small IPH as well as a small subarachnoid hemorrhage in the left MCA cistern.  He takes aspirin but no other blood thinners.  LKW: Sunday tpa given?: no, ICH   ROS: A 14 point ROS was performed and is negative except as noted in the HPI.   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 He has never used smokeless tobacco. He reports that he does not currently use alcohol. He reports that he does not use drugs.   Exam: Current vital signs: BP 133/83   Pulse 73   Temp 97.9 F (36.6 C)   Resp 16   SpO2 96%  Vital signs in last 24  hours: Temp:  [97.9 F (36.6 C)] 97.9 F (36.6 C) (10/27 1652) Pulse Rate:  [67-80] 73 (10/28 0330) Resp:  [10-25] 16 (10/28 0330) BP: (130-161)/(70-91) 133/83 (10/28 0330) SpO2:  [96 %-100 %] 96 % (10/28 0330)   Physical Exam  Constitutional: Appears well-developed and well-nourished.  Psych: Affect appropriate to situation Eyes: No scleral injection HENT: No OP obstruction MSK: no joint deformities.  Cardiovascular: Normal rate and regular rhythm.  Respiratory: Effort normal, non-labored breathing GI: Soft.  No distension. There is no tenderness.  Skin: WDI  Neuro: Mental Status: Patient is awake, alert, oriented to person, place, month, year, and situation. Patient is able to give a clear and coherent history. No signs of aphasia or neglect Cranial Nerves: II: Visual Fields are full. Pupils are equal, round, and reactive to light.   III,IV, VI: EOMI without ptosis or diploplia.  V: Facial sensation is symmetric to temperature VII: Facial movement is symmetric.  VIII: hearing is intact to voice X: Uvula elevates symmetrically XI: Shoulder shrug is symmetric. XII: tongue is midline without atrophy or fasciculations.  Motor: Tone is normal. Bulk is normal. 5/5 strength was present in all four extremities.  Sensory: Sensation is symmetric  to light touch and temperature in the arms and legs. Cerebellar: No clear ataxia  I have reviewed labs in epic and the results pertinent to this consultation are: Creatinine 2.2 INR 1.0 PTT 24  I have reviewed the images obtained: Small volume subarachnoid and left MCA cistern as well as small right parietal IPH.  The locations of this are suggestive of coup contrecoup injury  Impression: 75 year old male with minor head trauma on Sunday and development of symptoms on Monday with CT most suggestive of traumatic coup/contrecoup injury.  I agree given the mild nature of the injury as well as the transient diplopia today that further  investigation with an MRI would be helpful to rule out other causes such as multifocal emboli with hemorrhagic conversion or metastatic disease, etc.  If no concerning findings are found and repeat CT is negative, then I would favor treating this as a traumatic hemorrhage.  He has been stable symptomatically since Monday, and diplopia is a common postconcussive symptom, so I do not think he needs ICU admission or aggressive BP drips, etc.  I would favor trying to maintain his blood pressure less than 160 for the time being.  Recommendations: 1) BP control 2) MRI brain, MRA head and neck 3) repeat CT in the morning to demonstrate stability of the lesion. 4) stroke team to follow  Adam Rack, MD [facility] [phone]  If 7pm- 7am, please page neurology on call as listed in [place].

## 2020-12-03 NOTE — ED Notes (Signed)
Bladder scan, 626mL present

## 2020-12-04 DIAGNOSIS — I609 Nontraumatic subarachnoid hemorrhage, unspecified: Secondary | ICD-10-CM | POA: Diagnosis not present

## 2020-12-04 DIAGNOSIS — I629 Nontraumatic intracranial hemorrhage, unspecified: Secondary | ICD-10-CM | POA: Diagnosis not present

## 2020-12-04 LAB — BASIC METABOLIC PANEL
Anion gap: 8 (ref 5–15)
BUN: 27 mg/dL — ABNORMAL HIGH (ref 8–23)
CO2: 22 mmol/L (ref 22–32)
Calcium: 9.2 mg/dL (ref 8.9–10.3)
Chloride: 107 mmol/L (ref 98–111)
Creatinine, Ser: 1.32 mg/dL — ABNORMAL HIGH (ref 0.61–1.24)
GFR, Estimated: 57 mL/min — ABNORMAL LOW (ref >60)
Glucose, Bld: 132 mg/dL — ABNORMAL HIGH (ref 70–99)
Potassium: 3.3 mmol/L — ABNORMAL LOW (ref 3.5–5.1)
Sodium: 137 mmol/L (ref 135–145)

## 2020-12-04 LAB — CBC
HCT: 38.3 % — ABNORMAL LOW (ref 39.0–52.0)
Hemoglobin: 13 g/dL (ref 13.0–17.0)
MCH: 30.4 pg (ref 26.0–34.0)
MCHC: 33.9 g/dL (ref 30.0–36.0)
MCV: 89.5 fL (ref 80.0–100.0)
Platelets: 268 10*3/uL (ref 150–400)
RBC: 4.28 MIL/uL (ref 4.22–5.81)
RDW: 14.6 % (ref 11.5–15.5)
WBC: 8.4 10*3/uL (ref 4.0–10.5)
nRBC: 0 % (ref 0.0–0.2)

## 2020-12-04 MED ORDER — ASPIRIN EC 81 MG PO TBEC
81.0000 mg | DELAYED_RELEASE_TABLET | Freq: Every day | ORAL | Status: DC
Start: 2020-12-21 — End: 2020-12-04

## 2020-12-04 MED ORDER — POTASSIUM CHLORIDE CRYS ER 20 MEQ PO TBCR: 20.0000 meq | EXTENDED_RELEASE_TABLET | Freq: Every day | ORAL | 0 refills | Status: DC

## 2020-12-04 MED ORDER — POTASSIUM CHLORIDE CRYS ER 20 MEQ PO TBCR
40.0000 meq | EXTENDED_RELEASE_TABLET | Freq: Once | ORAL | Status: AC
  Administered 2020-12-04: 40 meq via ORAL
  Filled 2020-12-04: qty 2

## 2020-12-04 MED ORDER — AMLODIPINE BESYLATE 5 MG PO TABS: 5.0000 mg | ORAL_TABLET | Freq: Every day | ORAL | 0 refills | Status: DC

## 2020-12-04 NOTE — Discharge Summary (Signed)
Physician Discharge Summary  Adam Gonzales VCB:[id number] DOB: 25-May-1945 DOA: 12/02/2020  PCP: Vivi Barrack, MD  Admit date: 12/02/2020 Discharge date: 12/04/2020  Time spent: 35 minutes  Recommendations for Outpatient Follow-up:  PCP in 1 week  Needs repeat CT in 2-3 weeks, if ICH resolves or near resolved, ASA can be restarted BMP in 1 week   Discharge Diagnoses:  Principal Problem:   Intracranial bleed ([place]) Active Problems:   Carotid occlusion, left   HTN (hypertension)   Subarachnoid hemorrhage   Acute kidney injury superimposed on chronic kidney disease ([place])   Hypokalemia   BPH (benign prostatic hyperplasia)   Discharge Condition: Stable  Diet recommendation: Low-sodium, heart healthy  There were no vitals filed for this visit.  History of present illness:  Adam Gonzales is a 75 y.o. male presents normal state of health until Sunday.  He was at the gym and then had the bar still on the bench and went to sit down thinking his head was going to miss it but whacked his head against the bar.  He did not lose consciousness or have any immediate symptoms other than direct pain from where he hit it.  For the rest of the day, however, he had significant headache.  He began having nausea and feeling "off" on Monday.  He has been feeling slightly dizzy, but has a hard time saying exactly what was "off."  He had been concerned that he had possibly gotten sick from one of his work mates. On 10/27, he was driving and he noticed that he was having some double vision and therefore presented to the emergency department where head CT was performed.  It shows a left parietal small IPH as well as a small subarachnoid hemorrhage in the left MCA cistern.    Hospital Course:   Traumatic intracranial bleed: Patient presented with complaints of headache and double vision after hitting his head on a bar while at the gym 5 days prior   Initial CT scan of the brain right parietal  intraparenchymal hemorrhage  - MRI confirmed the ICH and SAH.  MRA head and neck showed chronic left ICA occlusion, otherwise unremarkable.  A1c 6.7, LDL 91 in 09/2020.  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  Noted to be stable on MRA of head and neck.   BPH:  -Continue Flomax   History of prostate cancer: Patient reports he had received radiation treatment and that PSA was noted to be 0.28 about a month ago on last check.   -Continue outpatient follow-up    Discharge Exam: Vitals:   12/04/20 0342 12/04/20 0830  BP: (!) 130/92 127/84  Pulse: 69 68  Resp: 16 16  Temp: 98.4 F (36.9 C) 98.5 F (36.9 C)  SpO2: 97% 98%    Gen: Awake, Alert,  Oriented X 3,  HEENT: no JVD Lungs: Good air movement bilaterally, CTAB CVS: S1S2/RRR Abd: soft, Non tender, non distended, BS present Extremities: No edema Skin: no new rashes on exposed skin   Discharge Instructions    Allergies as of 12/04/2020   No Known Allergies      Medication List     STOP taking these medications    aspirin EC 81 MG tablet   lisinopril 40 MG tablet Commonly known as: ZESTRIL   spironolactone 25 MG tablet Commonly known as: ALDACTONE       TAKE these medications    amLODipine 5 MG tablet Commonly known  as: NORVASC Take 1 tablet (5 mg total) by mouth daily.   colchicine 0.6 MG tablet Take 0.6 mg by mouth as needed (gout flares).   hydrochlorothiazide 12.5 MG tablet Commonly known as: HYDRODIURIL Take 12.5 mg by mouth daily.   potassium chloride SA 20 MEQ tablet Commonly known as: KLOR-CON Take 1 tablet (20 mEq total) by mouth daily.       No Known Allergies  Follow-up Information     [facility] Follow up in 1 month(s).   Specialty: Neurology Contact information: [address] [place] [phone]        Vivi Barrack, MD. Schedule an appointment as soon as possible for a visit in 1 week(s).   Specialty: Family Medicine Why: Need BMP in 1 week and repeat CT head in 2-3weeks Contact information: [place] [address] [phone]                  The results of significant diagnostics from this hospitalization (including imaging, microbiology, ancillary and laboratory) are listed below for reference.    Significant Diagnostic Studies: CT Head Wo Contrast  Result Date: 12/03/2020 CLINICAL DATA:  75 year old male with history of stroke. Follow-up study. EXAM: CT HEAD WITHOUT CONTRAST TECHNIQUE: Contiguous axial images were obtained from the base of the skull through the vertex without intravenous contrast. COMPARISON:  MRI brain 12/03/2020.  Head CT 12/02/2020. FINDINGS: Brain: High attenuation in the high right parietal region involving white matter, subcortical and cortical gray matter (axial image 24 of series 2 and coronal image 47 of series 4), similar to the prior study, currently measuring 1.9 x 1.2 x 2.1 cm with surrounding low attenuation compatible with vasogenic edema, exerting minimal local mass effect without associated midline shift. Scattered extra-axial high attenuation is also noted overlying the left cerebral hemisphere, most evident in the region of the anterior aspect of the  left sylvian fissure and left frontal region, similar to the prior examination. No definite intraventricular hemorrhage. No hydrocephalus. No midline shift or other findings to suggest herniation. Mild cerebral atrophy. Patchy and confluent areas of decreased attenuation are noted throughout the deep and periventricular white matter of the cerebral hemispheres bilaterally, compatible with chronic microvascular ischemic disease. Expansion of the subdural spaces bilaterally (left greater than right), similar to the prior examination, suspicious for probable hygromas. Vascular: Vascular calcifications are noted in the carotid siphons bilaterally. Vascular calcifications of the vertebral arteries bilaterally. Skull: Normal. Negative for fracture or focal lesion. Sinuses/Orbits: Mucoperiosteal thickening in the right maxillary sinus. No air-fluid levels. Other: None. IMPRESSION: 1. Subacute intraparenchymal hemorrhage in the posterior right parietal convexity with surrounding vasogenic edema, similar to prior examinations, as above. 2. Small volume of subarachnoid hemorrhage most evident in the left sylvian fissure and left frontal region, similar to the prior examination. 3.  No intraventricular hemorrhage or hydrocephalus. 4. Probable bilateral small subdural hygromas. 5. Mild cerebral atrophy with chronic microvascular ischemic changes in the cerebral white matter, as above. Electronically Signed   By: Vinnie Langton M.D.   On: 12/03/2020 05:58   CT HEAD WO CONTRAST  Result Date: 12/02/2020 CLINICAL DATA:  Double vision, possible TIA EXAM: CT HEAD WITHOUT CONTRAST TECHNIQUE: Contiguous axial images were obtained from the base of the skull through the vertex without intravenous contrast. COMPARISON:  None. FINDINGS: Brain: There is a 1.8 x 1.1 cm area of parenchymal hemorrhage in the right superior parietal lobule with mild surrounding edema. Minimal subdural hemorrhage is present along the falx and tentorium.  Small volume subarachnoid hemorrhage is present along the left MCA cistern extending into the anterior sylvian fissure and adjacent frontal cortical sulci. Minimal additional scattered sulcal subarachnoid hemorrhage is likely present. Suspect thin bilateral convexity subdural hygromas. Gray-white differentiation is preserved. There is no significant mass effect. No hydrocephalus. Vascular: There is atherosclerotic calcification at the skull base. Skull: Calvarium is unremarkable. Sinuses/Orbits: Mild paranasal sinus mucosal thickening. Orbits are unremarkable. Other: None. IMPRESSION: Small volume parenchymal, subdural, and subarachnoid hemorrhage as detailed above. Suspected bilateral cerebral convexity subdural hygromas. No significant mass effect. Vascular imaging and contrast enhanced MRI evaluation are recommended. These results were called by telephone at the time of interpretation on 12/02/2020 at 6:47 pm to provider [facility] , who verbally acknowledged these results. Electronically Signed   By: Macy Mis M.D.   On: 12/02/2020 18:53   MR ANGIO HEAD WO CONTRAST  Result Date: 12/03/2020 CLINICAL DATA:  Follow-up examination for acute intracranial hemorrhage. EXAM: MRI HEAD WITHOUT CONTRAST MRA HEAD WITHOUT CONTRAST MRA NECK WITHOUT AND WITH CONTRAST TECHNIQUE: Multiplanar, multi-echo pulse sequences of the brain and surrounding structures were acquired without intravenous contrast. Angiographic images of the Circle of Willis were acquired using MRA technique without intravenous contrast. Angiographic images of the neck were acquired using MRA technique without and with intravenous contrast. Carotid stenosis measurements (when applicable) are obtained utilizing NASCET criteria, using the distal internal carotid diameter as the denominator. CONTRAST:  65mL GADAVIST GADOBUTROL 1 MMOL/ML IV SOLN COMPARISON:  Comparison made with prior CT from 12/02/2020. FINDINGS: MRI HEAD FINDINGS Brain: Moderately  advanced age-related cerebral atrophy. Scattered patchy T2/FLAIR hyperintensity within the periventricular and deep white matter both cerebral hemispheres most consistent with chronic small vessel ischemic disease, overall mild for age. Previously identified intraparenchymal hemorrhage positioned at the posterior right parietal convexity again seen, relatively stable in size measuring 1.8 x 0.9 x 2.1 cm (estimated volume 1.7 mL). Mild surrounding vasogenic edema without significant regional mass effect or midline shift. No visible underlying lesion seen on this noncontrast examination. Additional scattered small volume subarachnoid hemorrhage involving the anterior left frontotemporal region and left sylvian fissure again seen. Small volume subarachnoid blood overlies the posterior left parietal convexity as well. Overall appearance is stable from prior CT as well. Probable trace scattered subarachnoid blood seen elsewhere within the brain on SWI sequence, likely related to redistribution. Trace susceptibility artifact within the occipital horn of the right lateral ventricle also likely related to recirculation. No other foci of susceptibility artifact to suggest acute or chronic intracranial hemorrhage elsewhere within the brain. No other diffusion abnormality to suggest acute or subacute ischemia. Gray-white matter differentiation otherwise maintained. No mass lesion or midline shift. No hydrocephalus. Small subdural hygromas/effusions overlie the bilateral cerebral convexities measuring up to approximately 2 mm bilaterally. Pituitary gland suprasellar region within normal limits.  Midline structures intact. Vascular: Loss of normal flow void within the left ICA to the level of the terminus, consistent with known chronic left ICA occlusion. Major intracranial vascular flow voids are otherwise maintained. Skull and upper cervical spine: Craniocervical junction within normal limits. Degenerative spondylosis at C3-4  with resultant mild to moderate spinal stenosis (series 9, image 14). Bone marrow signal intensity heterogeneous without focal marrow replacing lesion. No scalp soft tissue abnormality. Sinuses/Orbits: Globes and orbital soft tissues within normal limits. Scattered mucosal thickening noted within the ethmoidal air cells and maxillary sinuses. No mastoid effusion. Inner ear structures grossly normal. Other: None. MRA HEAD FINDINGS Anterior circulation: Distal cervical segment of the right internal carotid artery patent with antegrade flow. Petrous right ICA patent. Atheromatous irregularity within the right carotid siphon with no more than mild multifocal narrowing. The cervical left ICA is occluded. Distal reconstitution with attenuated flow within the supraclinoid segment, likely collateral across the circle-of-Willis. Left M1 segment attenuated but patent. No visible aneurysm about the left MCA bifurcation. Distal left MCA branches attenuated but perfused distally. A1 segments patent bilaterally. Normal anterior communicating artery complex. Anterior cerebral arteries patent to their distal aspects without stenosis. Right M1 segment patent. Normal right MCA bifurcation. Distal right MCA branches well perfused. No aneurysm or other vascular abnormality about the anterior circulation. Posterior circulation: Visualized distal V4 segments patent without stenosis. Left PICA origin patent and normal. Right PICA not seen. Basilar patent to its distal aspect without stenosis. Superior cerebellar arteries patent bilaterally. Left PCA primarily supplied via the basilar. Predominant fetal type origin of the right PCA. Both PCAs well perfused to their distal aspects. Distal small vessel atheromatous irregularity seen throughout the intracranial circulation. Anatomic variants: Fetal type origin of the right PCA. MRA NECK FINDINGS Aortic arch: Visualized aortic arch normal caliber with normal branch pattern. No stenosis. Right  carotid system: Right CCA patent without stenosis. Mild atheromatous irregularity about the right carotid bulb/proximal right ICA without significant stenosis. Right ICA patent distally without stenosis, evidence for dissection or occlusion. Left carotid system: Left CCA patent without stenosis. Occlusion of the left ICA at its origin, stable from previous. Left ICA remains occluded within the neck. Left external carotid artery and its branches remain perfused. Vertebral arteries: Both vertebral arteries arise from the subclavian arteries. No proximal subclavian artery stenosis. Short-segment mild-to-moderate stenosis at the origin of the left vertebral artery noted (series 1029, image 8). Vertebral arteries otherwise patent without stenosis, evidence for dissection or occlusion. Other: None IMPRESSION: MRI HEAD: 1. 2.1 cm acute intraparenchymal hemorrhage positioned at the posterior right parietal convexity, stable from previous CT. Mild surrounding edema without significant regional mass effect or midline shift. Etiology of this hemorrhage is unclear, with no underlying mass or other abnormality evident on this noncontrast examination. 2. Additional scattered small volume subarachnoid hemorrhage, predominantly localized at the left sylvian fissure, also stable from prior. 3. Superimposed small bilateral subdural hygromas/effusions without significant mass effect or midline shift. 4. Underlying age-related cerebral atrophy with mild chronic small vessel ischemic disease. MRA HEAD: 1. 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 Distal reconstitution at the supraclinoid segment with attenuated but patent flow throughout the left MCA distribution. Appearance is relatively similar to prior CTA from 2019. 2. Otherwise stable patency of the intracranial arterial circulation. No hemodynamically significant or correctable stenosis. 3. No vascular abnormality to explain the acute intracranial hemorrhage is identified. MRA  NECK: 1. Occlusion of the left ICA at its origin, stable from prior. 2. Atheromatous  change about the right carotid bifurcation without hemodynamically significant stenosis. Right carotid system remains widely patent within the neck. 3. Short-segment mild-to-moderate stenosis at the origin of the left vertebral artery. Otherwise wide patency of both vertebral arteries within the neck. Electronically Signed   By: Jeannine Boga M.D.   On: 12/03/2020 04:27   MR Angiogram Neck W or Wo Contrast  Result Date: 12/03/2020 CLINICAL DATA:  Follow-up examination for acute intracranial hemorrhage. EXAM: MRI HEAD WITHOUT CONTRAST MRA HEAD WITHOUT CONTRAST MRA NECK WITHOUT AND WITH CONTRAST TECHNIQUE: Multiplanar, multi-echo pulse sequences of the brain and surrounding structures were acquired without intravenous contrast. Angiographic images of the Circle of Willis were acquired using MRA technique without intravenous contrast. Angiographic images of the neck were acquired using MRA technique without and with intravenous contrast. Carotid stenosis measurements (when applicable) are obtained utilizing NASCET criteria, using the distal internal carotid diameter as the denominator. CONTRAST:  68mL GADAVIST GADOBUTROL 1 MMOL/ML IV SOLN COMPARISON:  Comparison made with prior CT from 12/02/2020. FINDINGS: MRI HEAD FINDINGS Brain: Moderately advanced age-related cerebral atrophy. Scattered patchy T2/FLAIR hyperintensity within the periventricular and deep white matter both cerebral hemispheres most consistent with chronic small vessel ischemic disease, overall mild for age. Previously identified intraparenchymal hemorrhage positioned at the posterior right parietal convexity again seen, relatively stable in size measuring 1.8 x 0.9 x 2.1 cm (estimated volume 1.7 mL). Mild surrounding vasogenic edema without significant regional mass effect or midline shift. No visible underlying lesion seen on this noncontrast examination.  Additional scattered small volume subarachnoid hemorrhage involving the anterior left frontotemporal region and left sylvian fissure again seen. Small volume subarachnoid blood overlies the posterior left parietal convexity as well. Overall appearance is stable from prior CT as well. Probable trace scattered subarachnoid blood seen elsewhere within the brain on SWI sequence, likely related to redistribution. Trace susceptibility artifact within the occipital horn of the right lateral ventricle also likely related to recirculation. No other foci of susceptibility artifact to suggest acute or chronic intracranial hemorrhage elsewhere within the brain. No other diffusion abnormality to suggest acute or subacute ischemia. Gray-white matter differentiation otherwise maintained. No mass lesion or midline shift. No hydrocephalus. Small subdural hygromas/effusions overlie the bilateral cerebral convexities measuring up to approximately 2 mm bilaterally. Pituitary gland suprasellar region within normal limits. Midline structures intact. Vascular: Loss of normal flow void within the left ICA to the level of the terminus, consistent with known chronic left ICA occlusion. Major intracranial vascular flow voids are otherwise maintained. Skull and upper cervical spine: Craniocervical junction within normal limits. Degenerative spondylosis at C3-4 with resultant mild to moderate spinal stenosis (series 9, image 14). Bone marrow signal intensity heterogeneous without focal marrow replacing lesion. No scalp soft tissue abnormality. Sinuses/Orbits: Globes and orbital soft tissues within normal limits. Scattered mucosal thickening noted within the ethmoidal air cells and maxillary sinuses. No mastoid effusion. Inner ear structures grossly normal. Other: None. MRA HEAD FINDINGS Anterior circulation: Distal cervical segment of the right internal carotid artery patent with antegrade flow. Petrous right ICA patent. Atheromatous  irregularity within the right carotid siphon with no more than mild multifocal narrowing. The cervical left ICA is occluded. Distal reconstitution with attenuated flow within the supraclinoid segment, likely collateral across the circle-of-Willis. Left M1 segment attenuated but patent. No visible aneurysm about the left MCA bifurcation. Distal left MCA branches attenuated but perfused distally. A1 segments patent bilaterally. Normal anterior communicating artery complex. Anterior cerebral arteries patent to their distal aspects without stenosis. Right  M1 segment patent. Normal right MCA bifurcation. Distal right MCA branches well perfused. No aneurysm or other vascular abnormality about the anterior circulation. Posterior circulation: Visualized distal V4 segments patent without stenosis. Left PICA origin patent and normal. Right PICA not seen. Basilar patent to its distal aspect without stenosis. Superior cerebellar arteries patent bilaterally. Left PCA primarily supplied via the basilar. Predominant fetal type origin of the right PCA. Both PCAs well perfused to their distal aspects. Distal small vessel atheromatous irregularity seen throughout the intracranial circulation. Anatomic variants: Fetal type origin of the right PCA. MRA NECK FINDINGS Aortic arch: Visualized aortic arch normal caliber with normal branch pattern. No stenosis. Right carotid system: Right CCA patent without stenosis. Mild atheromatous irregularity about the right carotid bulb/proximal right ICA without significant stenosis. Right ICA patent distally without stenosis, evidence for dissection or occlusion. Left carotid system: Left CCA patent without stenosis. Occlusion of the left ICA at its origin, stable from previous. Left ICA remains occluded within the neck. Left external carotid artery and its branches remain perfused. Vertebral arteries: Both vertebral arteries arise from the subclavian arteries. No proximal subclavian artery  stenosis. Short-segment mild-to-moderate stenosis at the origin of the left vertebral artery noted (series 1029, image 8). Vertebral arteries otherwise patent without stenosis, evidence for dissection or occlusion. Other: None IMPRESSION: MRI HEAD: 1. 2.1 cm acute intraparenchymal hemorrhage positioned at the posterior right parietal convexity, stable from previous CT. Mild surrounding edema without significant regional mass effect or midline shift. Etiology of this hemorrhage is unclear, with no underlying mass or other abnormality evident on this noncontrast examination. 2. Additional scattered small volume subarachnoid hemorrhage, predominantly localized at the left sylvian fissure, also stable from prior. 3. Superimposed small bilateral subdural hygromas/effusions without significant mass effect or midline shift. 4. Underlying age-related cerebral atrophy with mild chronic small vessel ischemic disease. MRA HEAD: 1. 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 Distal reconstitution at the supraclinoid segment with attenuated but patent flow throughout the left MCA distribution. Appearance is relatively similar to prior CTA from 2019. 2. Otherwise stable patency of the intracranial arterial circulation. No hemodynamically significant or correctable stenosis. 3. No vascular abnormality to explain the acute intracranial hemorrhage is identified. MRA NECK: 1. Occlusion of the left ICA at its origin, stable from prior. 2. Atheromatous change about the right carotid bifurcation without hemodynamically significant stenosis. Right carotid system remains widely patent within the neck. 3. Short-segment mild-to-moderate stenosis at the origin of the left vertebral artery. Otherwise wide patency of both vertebral arteries within the neck. Electronically Signed   By: Jeannine Boga M.D.   On: 12/03/2020 04:27   MR BRAIN WO CONTRAST  Result Date: 12/03/2020 CLINICAL DATA:  Follow-up examination for acute  intracranial hemorrhage. EXAM: MRI HEAD WITHOUT CONTRAST MRA HEAD WITHOUT CONTRAST MRA NECK WITHOUT AND WITH CONTRAST TECHNIQUE: Multiplanar, multi-echo pulse sequences of the brain and surrounding structures were acquired without intravenous contrast. Angiographic images of the Circle of Willis were acquired using MRA technique without intravenous contrast. Angiographic images of the neck were acquired using MRA technique without and with intravenous contrast. Carotid stenosis measurements (when applicable) are obtained utilizing NASCET criteria, using the distal internal carotid diameter as the denominator. CONTRAST:  101mL GADAVIST GADOBUTROL 1 MMOL/ML IV SOLN COMPARISON:  Comparison made with prior CT from 12/02/2020. FINDINGS: MRI HEAD FINDINGS Brain: Moderately advanced age-related cerebral atrophy. Scattered patchy T2/FLAIR hyperintensity within the periventricular and deep white matter both cerebral hemispheres most consistent with chronic small vessel ischemic disease,  overall mild for age. Previously identified intraparenchymal hemorrhage positioned at the posterior right parietal convexity again seen, relatively stable in size measuring 1.8 x 0.9 x 2.1 cm (estimated volume 1.7 mL). Mild surrounding vasogenic edema without significant regional mass effect or midline shift. No visible underlying lesion seen on this noncontrast examination. Additional scattered small volume subarachnoid hemorrhage involving the anterior left frontotemporal region and left sylvian fissure again seen. Small volume subarachnoid blood overlies the posterior left parietal convexity as well. Overall appearance is stable from prior CT as well. Probable trace scattered subarachnoid blood seen elsewhere within the brain on SWI sequence, likely related to redistribution. Trace susceptibility artifact within the occipital horn of the right lateral ventricle also likely related to recirculation. No other foci of susceptibility artifact to  suggest acute or chronic intracranial hemorrhage elsewhere within the brain. No other diffusion abnormality to suggest acute or subacute ischemia. Gray-white matter differentiation otherwise maintained. No mass lesion or midline shift. No hydrocephalus. Small subdural hygromas/effusions overlie the bilateral cerebral convexities measuring up to approximately 2 mm bilaterally. Pituitary gland suprasellar region within normal limits. Midline structures intact. Vascular: Loss of normal flow void within the left ICA to the level of the terminus, consistent with known chronic left ICA occlusion. Major intracranial vascular flow voids are otherwise maintained. Skull and upper cervical spine: Craniocervical junction within normal limits. Degenerative spondylosis at C3-4 with resultant mild to moderate spinal stenosis (series 9, image 14). Bone marrow signal intensity heterogeneous without focal marrow replacing lesion. No scalp soft tissue abnormality. Sinuses/Orbits: Globes and orbital soft tissues within normal limits. Scattered mucosal thickening noted within the ethmoidal air cells and maxillary sinuses. No mastoid effusion. Inner ear structures grossly normal. Other: None. MRA HEAD FINDINGS Anterior circulation: Distal cervical segment of the right internal carotid artery patent with antegrade flow. Petrous right ICA patent. Atheromatous irregularity within the right carotid siphon with no more than mild multifocal narrowing. The cervical left ICA is occluded. Distal reconstitution with attenuated flow within the supraclinoid segment, likely collateral across the circle-of-Willis. Left M1 segment attenuated but patent. No visible aneurysm about the left MCA bifurcation. Distal left MCA branches attenuated but perfused distally. A1 segments patent bilaterally. Normal anterior communicating artery complex. Anterior cerebral arteries patent to their distal aspects without stenosis. Right M1 segment patent. Normal right  MCA bifurcation. Distal right MCA branches well perfused. No aneurysm or other vascular abnormality about the anterior circulation. Posterior circulation: Visualized distal V4 segments patent without stenosis. Left PICA origin patent and normal. Right PICA not seen. Basilar patent to its distal aspect without stenosis. Superior cerebellar arteries patent bilaterally. Left PCA primarily supplied via the basilar. Predominant fetal type origin of the right PCA. Both PCAs well perfused to their distal aspects. Distal small vessel atheromatous irregularity seen throughout the intracranial circulation. Anatomic variants: Fetal type origin of the right PCA. MRA NECK FINDINGS Aortic arch: Visualized aortic arch normal caliber with normal branch pattern. No stenosis. Right carotid system: Right CCA patent without stenosis. Mild atheromatous irregularity about the right carotid bulb/proximal right ICA without significant stenosis. Right ICA patent distally without stenosis, evidence for dissection or occlusion. Left carotid system: Left CCA patent without stenosis. Occlusion of the left ICA at its origin, stable from previous. Left ICA remains occluded within the neck. Left external carotid artery and its branches remain perfused. Vertebral arteries: Both vertebral arteries arise from the subclavian arteries. No proximal subclavian artery stenosis. Short-segment mild-to-moderate stenosis at the origin of the left vertebral artery noted (  series 1029, image 8). Vertebral arteries otherwise patent without stenosis, evidence for dissection or occlusion. Other: None IMPRESSION: MRI HEAD: 1. 2.1 cm acute intraparenchymal hemorrhage positioned at the posterior right parietal convexity, stable from previous CT. Mild surrounding edema without significant regional mass effect or midline shift. Etiology of this hemorrhage is unclear, with no underlying mass or other abnormality evident on this noncontrast examination. 2. Additional  scattered small volume subarachnoid hemorrhage, predominantly localized at the left sylvian fissure, also stable from prior. 3. Superimposed small bilateral subdural hygromas/effusions without significant mass effect or midline shift. 4. Underlying age-related cerebral atrophy with mild chronic small vessel ischemic disease. MRA HEAD: 1. 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 Distal reconstitution at the supraclinoid segment with attenuated but patent flow throughout the left MCA distribution. Appearance is relatively similar to prior CTA from 2019. 2. Otherwise stable patency of the intracranial arterial circulation. No hemodynamically significant or correctable stenosis. 3. No vascular abnormality to explain the acute intracranial hemorrhage is identified. MRA NECK: 1. Occlusion of the left ICA at its origin, stable from prior. 2. Atheromatous change about the right carotid bifurcation without hemodynamically significant stenosis. Right carotid system remains widely patent within the neck. 3. Short-segment mild-to-moderate stenosis at the origin of the left vertebral artery. Otherwise wide patency of both vertebral arteries within the neck. Electronically Signed   By: Jeannine Boga M.D.   On: 12/03/2020 04:27   ECHOCARDIOGRAM COMPLETE  Result Date: 12/03/2020    ECHOCARDIOGRAM REPORT   Patient Name:   Adam Gonzales Date of Exam: 12/03/2020 Medical Rec #:  [id number]        Height:       69.5 in Accession #:    [id number]       Weight:       190.2 lb Date of Birth:  1946-01-01        BSA:          2.032 m Patient Age:    61 years         BP:           145/82 mmHg Patient Gender: M                HR:           74 bpm. Exam Location:  Inpatient Procedure: 2D Echo, Color Doppler and Cardiac Doppler Indications:    Stroke  History:        Patient has no prior history of Echocardiogram examinations.                 Intracranial bleed due to falling and hitting the back of his                 head at  the gym.  Sonographer:    Merrie Roof RDCS Referring Phys: [id number] [facility]  Sonographer Comments: No subcostal window. IMPRESSIONS  1. Left ventricular ejection fraction, by estimation, is 60 to 65%. The left ventricle has normal function. The left ventricle has no regional wall motion abnormalities. There is mild asymmetric left ventricular hypertrophy. Left ventricular diastolic parameters are consistent with Grade I diastolic dysfunction (impaired relaxation).  2. Right ventricular systolic function is normal. The right ventricular size is normal.  3. Left atrial size was severely dilated.  4. The mitral valve is grossly normal. Trivial mitral valve regurgitation.  5. The aortic valve is calcified. There is moderate calcification of the aortic valve. There is moderate thickening of the aortic valve.  Aortic valve regurgitation is trivial. Mild aortic valve stenosis. FINDINGS  Left Ventricle: Left ventricular ejection fraction, by estimation, is 60 to 65%. The left ventricle has normal function. The left ventricle has no regional wall motion abnormalities. The left ventricular internal cavity size was normal in size. There is  mild asymmetric left ventricular hypertrophy. Left ventricular diastolic parameters are consistent with Grade I diastolic dysfunction (impaired relaxation). Right Ventricle: The right ventricular size is normal. Right vetricular wall thickness was not well visualized. Right ventricular systolic function is normal. Left Atrium: Left atrial size was severely dilated. Right Atrium: Right atrial size was normal in size. Pericardium: There is no evidence of pericardial effusion. Mitral Valve: The mitral valve is grossly normal. Trivial mitral valve regurgitation. Tricuspid Valve: The tricuspid valve is grossly normal. Tricuspid valve regurgitation is trivial. Aortic Valve: The aortic valve is calcified. There is moderate calcification of the aortic valve. There is moderate thickening of the  aortic valve. Aortic valve regurgitation is trivial. Mild aortic stenosis is present. Aortic valve mean gradient measures 13.5 mmHg. Aortic valve peak gradient measures 26.4 mmHg. Aortic valve area, by VTI measures 1.55 cm. Pulmonic Valve: The pulmonic valve was normal in structure. Pulmonic valve regurgitation is not visualized. Aorta: The aortic root and ascending aorta are structurally normal, with no evidence of dilitation. IAS/Shunts: The atrial septum is grossly normal.  LEFT VENTRICLE PLAX 2D LVIDd:         3.80 cm   Diastology LVIDs:         2.60 cm   LV e' medial:    6.20 cm/s LV PW:         1.10 cm   LV E/e' medial:  12.9 LV IVS:        1.30 cm   LV e' lateral:   15.20 cm/s LVOT diam:     2.20 cm   LV E/e' lateral: 5.3 LV SV:         85 LV SV Index:   42 LVOT Area:     3.80 cm  RIGHT VENTRICLE RV Basal diam:  3.50 cm LEFT ATRIUM              Index        RIGHT ATRIUM           Index LA diam:        5.10 cm  2.51 cm/m   RA Area:     15.00 cm LA Vol (A2C):   87.6 ml  43.10 ml/m  RA Volume:   30.60 ml  15.06 ml/m LA Vol (A4C):   110.0 ml 54.12 ml/m LA Biplane Vol: 103.0 ml 50.68 ml/m  AORTIC VALVE AV Area (Vmax):    1.79 cm AV Area (Vmean):   1.85 cm AV Area (VTI):     1.55 cm AV Vmax:           257.00 cm/s AV Vmean:          172.500 cm/s AV VTI:            0.550 m AV Peak Grad:      26.4 mmHg AV Mean Grad:      13.5 mmHg LVOT Vmax:         121.00 cm/s LVOT Vmean:        84.000 cm/s LVOT VTI:          0.224 m LVOT/AV VTI ratio: 0.41  AORTA Ao Root diam: 3.70 cm Ao Asc diam:  3.80 cm MITRAL  VALVE                TRICUSPID VALVE MV Area (PHT): 2.36 cm     TR Peak grad:   29.4 mmHg MV Decel Time: 322 msec     TR Vmax:        271.00 cm/s MV E velocity: 80.10 cm/s MV A velocity: 111.00 cm/s  SHUNTS MV E/A ratio:  0.72         Systemic VTI:  0.22 m                             Systemic Diam: 2.20 cm Mertie Moores MD Electronically signed by Mertie Moores MD Signature Date/Time: 12/03/2020/3:26:08 PM     Final     Microbiology: Recent Results (from the past 240 hour(s))  Resp Panel by RT-PCR (Flu A&B, Covid) Nasopharyngeal Swab     Status: None   Collection Time: 12/02/20  6:14 PM   Specimen: Nasopharyngeal Swab; Nasopharyngeal(NP) swabs in vial transport medium  Result Value Ref Range Status   SARS Coronavirus 2 by RT PCR NEGATIVE NEGATIVE Final    Comment: (NOTE) SARS-CoV-2 target nucleic acids are NOT DETECTED.  The SARS-CoV-2 RNA is generally detectable in upper respiratory specimens during the acute phase of infection. The lowest concentration of SARS-CoV-2 viral copies this assay can detect is 138 copies/mL. A negative result does not preclude SARS-Cov-2 infection and should not be used as the sole basis for treatment or other patient management decisions. A negative result may occur with  improper specimen collection/handling, submission of specimen other than nasopharyngeal swab, presence of viral mutation(s) within the areas targeted by this assay, and inadequate number of viral copies(<138 copies/mL). A negative result must be combined with clinical observations, patient history, and epidemiological information. The expected result is Negative.  Fact Sheet for Patients:  EntrepreneurPulse.com.au  Fact Sheet for Healthcare Providers:  IncredibleEmployment.be  This test is no t yet approved or cleared by the [place] FDA and  has been authorized for detection and/or diagnosis of SARS-CoV-2 by FDA under an Emergency Use Authorization (EUA). This EUA will remain  in effect (meaning this test can be used) for the duration of the COVID-19 declaration under Section 564(b)(1) of the Act, 21 U.S.C.section 360bbb-3(b)(1), unless the authorization is terminated  or revoked sooner.       Influenza A by PCR NEGATIVE NEGATIVE Final   Influenza B by PCR NEGATIVE NEGATIVE Final    Comment: (NOTE) The Xpert Xpress SARS-CoV-2/FLU/RSV plus  assay is intended as an aid in the diagnosis of influenza from Nasopharyngeal swab specimens and should not be used as a sole basis for treatment. Nasal washings and aspirates are unacceptable for Xpert Xpress SARS-CoV-2/FLU/RSV testing.  Fact Sheet for Patients: EntrepreneurPulse.com.au  Fact Sheet for Healthcare Providers: IncredibleEmployment.be  This test is not yet approved or cleared by the [place] FDA and has been authorized for detection and/or diagnosis of SARS-CoV-2 by FDA under an Emergency Use Authorization (EUA). This EUA will remain in effect (meaning this test can be used) for the duration of the COVID-19 declaration under Section 564(b)(1) of the Act, 21 U.S.C. section 360bbb-3(b)(1), unless the authorization is terminated or revoked.  Performed at [facility], [address], [place], [place] [address]      Labs: Basic Metabolic Panel: Recent Labs  Lab 12/02/20 1702 12/03/20 0828 12/04/20 0651  NA 137 135 137  K 3.2* 3.0* 3.3*  CL 103 104 107  CO2 21* 21* 22  GLUCOSE 117* 141* 132*  BUN 48* 38* 27*  CREATININE 2.20* 1.47* 1.32*  CALCIUM 9.6 9.4 9.2  MG  --  2.1  --    Liver Function Tests: Recent Labs  Lab 12/02/20 1702  AST 36  ALT 21  ALKPHOS 53  BILITOT 0.7  PROT 7.0  ALBUMIN 4.3   No results for input(s): LIPASE, AMYLASE in the last 168 hours. No results for input(s): AMMONIA in the last 168 hours. CBC: Recent Labs  Lab 12/02/20 1702 12/04/20 0651  WBC 7.9 8.4  NEUTROABS 6.2  --   HGB 14.1 13.0  HCT 42.2 38.3*  MCV 88.3 89.5  PLT 315 268   Cardiac Enzymes: No results for input(s): CKTOTAL, CKMB, CKMBINDEX, TROPONINI in the last 168 hours. BNP: BNP (last 3 results) No results for input(s): BNP in the last 8760 hours.  ProBNP (last 3 results) No results for input(s): PROBNP in the last 8760 hours.  CBG: No results for input(s): GLUCAP in the last 168  hours.     Signed:  Domenic Polite MD.  [facility] 12/04/2020, 11:09 AM

## 2020-12-04 NOTE — Progress Notes (Addendum)
STROKE TEAM PROGRESS NOTE   ATTENDING NOTE: I reviewed above note and agree with the assessment and plan. Pt was seen and examined.   No acute event overnight, neuro intact.  Creatinine trending down, from 2.2 on presentation to 1.32 today. 2D echo EF 60 to 65%.  Vitals and labs stable.   Again, etiology for patient ICH and SAH most likely due to recent mild head trauma with coup -contrecoup injury.  Hold off antiplatelet for now, repeat CT in 3 weeks, if blood resolves or near resolution, may consider restart home aspirin 81.  BP goal long-term normotensive.  Off IV fluid.  PT OT no recommendation.   For detailed assessment and plan, please refer to above as I have made changes wherever appropriate.   Neurology will sign off. Please call with questions. Pt will follow up with stroke clinic NP at [facility] in about 4 weeks. Thanks for the consult.  Rosalin Hawking, MD PhD Stroke Neurology 12/04/2020 3:11 PM    INTERVAL HISTORY No one is at the bedside at time of this exam.  75 year old male with history of chronic left carotid occlusion, hypertension, prostate cancer admitted for headache, nausea, dizziness and transient diplopia post mild head trauma.  CT right high convexity small ICH with left superficial small SAH.  CT repeat stable ICH and SAH.  MRI confirmed the ICH and SAH.  MRA head and neck showed chronic left ICA occlusion, otherwise unremarkable.  A1c 6.7, LDL 91 in 09/2020.  UDS negative.  Creatinine 1.38-> 2.20.  Patient appears intact. No complaints of headaches or visual changes.  CT head done today shows stable  subacute IPH in posterior right parietal convexity with surrounding vasogenic edema, as well as stable, small volume of SAH at left sylvian fissure.    Workup concluded and we will sign off at this time  but remain available to assist.   Vitals:   12/03/20 1550 12/03/20 1956 12/04/20 0034 12/04/20 0342  BP: 129/75 136/64 120/69 (!) 130/92  Pulse: 83 62 69 69  Resp: 18 16 16  16   Temp: 98.3 F (36.8 C) 97.7 F (36.5 C) 98.1 F (36.7 C) 98.4 F (36.9 C)  TempSrc: Oral Oral  Oral  SpO2: 100% 100% 99% 97%   CBC:  Recent Labs  Lab 12/02/20 1702 12/04/20 0651  WBC 7.9 8.4  NEUTROABS 6.2  --   HGB 14.1 13.0  HCT 42.2 38.3*  MCV 88.3 89.5  PLT 315 950    Basic Metabolic Panel:  Recent Labs  Lab 12/03/20 0828 12/04/20 0651  NA 135 137  K 3.0* 3.3*  CL 104 107  CO2 21* 22  GLUCOSE 141* 132*  BUN 38* 27*  CREATININE 1.47* 1.32*  CALCIUM 9.4 9.2  MG 2.1  --      Lipid Panel: No results for input(s): CHOL, TRIG, HDL, CHOLHDL, VLDL, LDLCALC in the last 168 hours.  HgbA1c: No results for input(s): HGBA1C in the last 168 hours. Urine Drug Screen:  Recent Labs  Lab 12/02/20 1814  LABOPIA NONE DETECTED  COCAINSCRNUR NONE DETECTED  LABBENZ NONE DETECTED  AMPHETMU NONE DETECTED  THCU NONE DETECTED  LABBARB NONE DETECTED     Alcohol Level  Recent Labs  Lab 12/02/20 1814  ETH <10     IMAGING past 24 hours ECHOCARDIOGRAM COMPLETE  Result Date: 12/03/2020    ECHOCARDIOGRAM REPORT   Patient Name:   AMR STURTEVANT Date of Exam: 12/03/2020 Medical Rec #:  [id number]  Height:       69.5 in Accession #:    [id number]       Weight:       190.2 lb Date of Birth:  1945-07-29        BSA:          2.032 m Patient Age:    75 years         BP:           145/82 mmHg Patient Gender: M                HR:           74 bpm. Exam Location:  Inpatient Procedure: 2D Echo, Color Doppler and Cardiac Doppler Indications:    Stroke  History:        Patient has no prior history of Echocardiogram examinations.                 Intracranial bleed due to falling and hitting the back of his                 head at the gym.  Sonographer:    Merrie Roof RDCS Referring Phys: [id number] [facility]  Sonographer Comments: No subcostal window. IMPRESSIONS  1. Left ventricular ejection fraction, by estimation, is 60 to 65%. The left ventricle has normal function. The left  ventricle has no regional wall motion abnormalities. There is mild asymmetric left ventricular hypertrophy. Left ventricular diastolic parameters are consistent with Grade I diastolic dysfunction (impaired relaxation).  2. Right ventricular systolic function is normal. The right ventricular size is normal.  3. Left atrial size was severely dilated.  4. The mitral valve is grossly normal. Trivial mitral valve regurgitation.  5. The aortic valve is calcified. There is moderate calcification of the aortic valve. There is moderate thickening of the aortic valve. Aortic valve regurgitation is trivial. Mild aortic valve stenosis. FINDINGS  Left Ventricle: Left ventricular ejection fraction, by estimation, is 60 to 65%. The left ventricle has normal function. The left ventricle has no regional wall motion abnormalities. The left ventricular internal cavity size was normal in size. There is  mild asymmetric left ventricular hypertrophy. Left ventricular diastolic parameters are consistent with Grade I diastolic dysfunction (impaired relaxation). Right Ventricle: The right ventricular size is normal. Right vetricular wall thickness was not well visualized. Right ventricular systolic function is normal. Left Atrium: Left atrial size was severely dilated. Right Atrium: Right atrial size was normal in size. Pericardium: There is no evidence of pericardial effusion. Mitral Valve: The mitral valve is grossly normal. Trivial mitral valve regurgitation. Tricuspid Valve: The tricuspid valve is grossly normal. Tricuspid valve regurgitation is trivial. Aortic Valve: The aortic valve is calcified. There is moderate calcification of the aortic valve. There is moderate thickening of the aortic valve. Aortic valve regurgitation is trivial. Mild aortic stenosis is present. Aortic valve mean gradient measures 13.5 mmHg. Aortic valve peak gradient measures 26.4 mmHg. Aortic valve area, by VTI measures 1.55 cm. Pulmonic Valve: The pulmonic  valve was normal in structure. Pulmonic valve regurgitation is not visualized. Aorta: The aortic root and ascending aorta are structurally normal, with no evidence of dilitation. IAS/Shunts: The atrial septum is grossly normal.  LEFT VENTRICLE PLAX 2D LVIDd:         3.80 cm   Diastology LVIDs:         2.60 cm   LV e' medial:    6.20 cm/s LV PW:  1.10 cm   LV E/e' medial:  12.9 LV IVS:        1.30 cm   LV e' lateral:   15.20 cm/s LVOT diam:     2.20 cm   LV E/e' lateral: 5.3 LV SV:         85 LV SV Index:   42 LVOT Area:     3.80 cm  RIGHT VENTRICLE RV Basal diam:  3.50 cm LEFT ATRIUM              Index        RIGHT ATRIUM           Index LA diam:        5.10 cm  2.51 cm/m   RA Area:     15.00 cm LA Vol (A2C):   87.6 ml  43.10 ml/m  RA Volume:   30.60 ml  15.06 ml/m LA Vol (A4C):   110.0 ml 54.12 ml/m LA Biplane Vol: 103.0 ml 50.68 ml/m  AORTIC VALVE AV Area (Vmax):    1.79 cm AV Area (Vmean):   1.85 cm AV Area (VTI):     1.55 cm AV Vmax:           257.00 cm/s AV Vmean:          172.500 cm/s AV VTI:            0.550 m AV Peak Grad:      26.4 mmHg AV Mean Grad:      13.5 mmHg LVOT Vmax:         121.00 cm/s LVOT Vmean:        84.000 cm/s LVOT VTI:          0.224 m LVOT/AV VTI ratio: 0.41  AORTA Ao Root diam: 3.70 cm Ao Asc diam:  3.80 cm MITRAL VALVE                TRICUSPID VALVE MV Area (PHT): 2.36 cm     TR Peak grad:   29.4 mmHg MV Decel Time: 322 msec     TR Vmax:        271.00 cm/s MV E velocity: 80.10 cm/s MV A velocity: 111.00 cm/s  SHUNTS MV E/A ratio:  0.72         Systemic VTI:  0.22 m                             Systemic Diam: 2.20 cm Mertie Moores MD Electronically signed by Mertie Moores MD Signature Date/Time: 12/03/2020/3:26:08 PM    Final     PHYSICAL EXAM: pt seen in bed, appears in no distress.   Neuro: Mental Status: Patient is awake, alert, oriented to person, place, month, year, and situation. Patient is able to give a clear and coherent history. No signs of aphasia or  neglect Cranial Nerves: II: Visual Fields are full. Pupils are equal, round, and reactive to light.   III,IV, VI: EOMI without ptosis or diploplia.  V: Facial sensation is symmetric to temperature VII: Facial movement is symmetric.  VIII: hearing is intact to voice X: Uvula elevates symmetrically XI: Shoulder shrug is symmetric. XII: tongue is midline without atrophy or fasciculations.  Motor: Tone is normal. Bulk is normal. 5/5 strength was present in all four extremities.  Sensory: Sensation is symmetric to light touch and temperature in the arms and legs. Cerebellar: No clear ataxia  ASSESSMENT/PLAN Mr. TANYA MARVIN is  a 75 y.o. male  with minor head trauma on Sunday and development of symptoms on Monday with CT most suggestive of traumatic coup/contrecoup injury developed transient diplopia on admission that we felt further investigation with an MRI would be helpful to rule out other causes such as multifocal emboli with hemorrhagic conversion or metastatic disease, etc. Neuroimaging reveals right IPH and left SAH. No IVH.   Traumatic ICH and SAH - right high convexity small IPH and left sylvian fissure small SAH from trauma.   CT head: admission:  Small volume parenchymal, subdural, and subarachnoid hemorrhage as detailed above. Suspected bilateral cerebral convexity subdural hygromas. No significant mass effect. Vascular imaging and contrast. enhanced MRI evaluation are recommended. MRI BRAIN without contrast:  1. 2.1 cm acute intraparenchymal hemorrhage positioned at the posterior right parietal convexity, stable from previous CT. Mild surrounding edema without significant regional mass effect or midline shift. Etiology of this hemorrhage is unclear, with no underlying mass or other abnormality evident on this noncontrast examination. 2. Additional scattered small volume subarachnoid hemorrhage, predominantly localized at the left sylvian fissure, also stable from prior. 3.  Superimposed small bilateral subdural hygromas/effusions without significant mass effect or midline shift. 4. Underlying age-related cerebral atrophy with mild chronic small vessel ischemic disease. MRA  HEAD:  1. 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 Distal reconstitution at the supraclinoid segment with attenuated but patent flow throughout the left MCA distribution. Appearance is relatively similar to prior CTA from 2019. 2. Otherwise stable patency of the intracranial arterial circulation. No hemodynamically significant or correctable stenosis. 3. No vascular abnormality to explain the acute intracranial hemorrhage is identified.  MRA NECK:  1. Occlusion of the left ICA at its origin, stable from prior. 2. Atheromatous change about the right carotid bifurcation without hemodynamically significant stenosis. Right carotid system remains widely patent within the neck. 3. Short-segment mild-to-moderate stenosis at the origin of the left vertebral artery. Otherwise wide patency of both vertebral arteries within the neck.  2D Echo LVEF 60-65%, severely dilated LA, atrial septum grossly normal.     LDL 91 HgbA1c 6.7 VTE prophylaxis - scd    Diet   Diet Heart Room service appropriate? Yes; Fluid consistency: Thin   aspirin 81 mg daily prior to admission, now on no antithrombotics d/t IPH.Marland Kitchen   Therapy recommendations:    Disposition:  home No further inpatient work up indicated and we will sign off at this time. No blood thinners. Should have follow up CT in a few weeks to assess for resolution of bleeds.   Hypertension Home meds:  hctz 12.5mg , lisinopril 40mg  daily, aldactone Stable Permissive hypertension (OK if < 220/120) but gradually normalize in 5-7 days Long-term BP goal normotensive  Hyperlipidemia Home meds:  none LDL 91, goal < 70 Add lipitor  High intensity statin  Continue statin at discharge   HgbA1c 6.7, goal < 7.0 CBGs No results for input(s): GLUCAP in  the last 72 hours.  SSI  Other Stroke Risk Factors  Advanced Age >/= 74   Personal history of prostate cancer   Other Active Problems Hypokalemia Left ICA chronic occlusion-stable on MRA haed  [facility] day # 0     To contact Stroke Continuity provider, please refer to [email or web address] After hours, contact General Neurology

## 2020-12-04 NOTE — Care Management Obs Status (Signed)
[facility] NOTIFICATION   Patient Details  Name: Adam Gonzales MRN: [id number] Date of Birth: March 04, 1945   Medicare Observation Status Notification Given:  Yes    Carles Collet, RN 12/04/2020, 12:21 PM

## 2020-12-04 NOTE — Evaluation (Signed)
Occupational Therapy Evaluation Patient Details Name: ESWIN WORRELL MRN: [id number] DOB: September 24, 1945 Today's Date: 12/04/2020   History of Present Illness 75 y.o. male who presented 12/02/20 with complaints of double vision after hitting his head on a bar while at the gym5 days ago. CT head 1.8 x 1.1 cm posterior right parietal intraparenchymal hemorrhage with surrounding vasogenic edema and small left subarachnoid hemorrhage of the left MCA cistern.   PMH significant of hypertension, left carotid artery occlusion, gout, and prostate cancer s/p radiation   Clinical Impression   Pt admitted for concerns listed above. PTA pt reported that he was independent with all ADL's and IADL's, including working at [employer]. At this time, all symptoms appear resolved. Vision is [facility]. Pt completed a path finding task with no difficulties or cognitive concerns. Overall pt back at baseline and needs no further OT services. Acute OT will sign off.       Recommendations for follow up therapy are one component of a multi-disciplinary discharge planning process, led by the attending physician.  Recommendations may be updated based on patient status, additional functional criteria and insurance authorization.   Follow Up Recommendations  No OT follow up    Assistance Recommended at Discharge None  Functional Status Assessment  Patient has not had a recent decline in their functional status  Equipment Recommendations  None recommended by OT    Recommendations for Other Services       Precautions / Restrictions Precautions Precautions: None Restrictions Weight Bearing Restrictions: No      Mobility Bed Mobility Overal bed mobility: Independent                  Transfers Overall transfer level: Independent                        Balance Overall balance assessment: Independent                               Standardized Balance  Assessment Standardized Balance Assessment : Dynamic Gait Index;Berg Balance Test Berg Balance Test Sit to Stand: Able to stand without using hands and stabilize independently Standing Unsupported: Able to stand safely 2 minutes Sitting with Back Unsupported but Feet Supported on Floor or Stool: Able to sit safely and securely 2 minutes Stand to Sit: Sits safely with minimal use of hands Transfers: Able to transfer safely, minor use of hands Standing Unsupported with Eyes Closed: Able to stand 10 seconds safely Standing Ubsupported with Feet Together: Able to place feet together independently and stand 1 minute safely From Standing, Reach Forward with Outstretched Arm: Can reach confidently >25 cm (10") From Standing Position, Pick up Object from Floor: Able to pick up shoe safely and easily From Standing Position, Turn to Look Behind Over each Shoulder: Looks behind from both sides and weight shifts well Turn 360 Degrees: Able to turn 360 degrees safely in 4 seconds or less Standing Unsupported, Alternately Place Feet on Step/Stool: Able to stand independently and safely and complete 8 steps in 20 seconds Standing Unsupported, One Foot in Front: Able to plae foot ahead of the other independently and hold 30 seconds Standing on One Leg: Able to lift leg independently and hold equal to or more than 3 seconds Total Score: 53 Dynamic Gait Index Level Surface: Normal Change in Gait Speed: Normal Gait with Horizontal Head Turns: Normal Gait with Vertical Head Turns: Normal Gait  and Pivot Turn: Normal Step Over Obstacle: Normal Step Around Obstacles: Normal Steps: Normal Total Score: 24     ADL either performed or assessed with clinical judgement   ADL Overall ADL's : At baseline                                       General ADL Comments: Pt independent has no safety concerns or ADL needs     Vision Baseline Vision/History: 0 No visual deficits Ability to See in  Adequate Light: 0 Adequate Patient Visual Report: No change from baseline Vision Assessment?: No apparent visual deficits Additional Comments: Pt reported diplopia 2 days ago, says that has resolved. Pt tracks well, convergence WNL, Peripheral vision intact, no blurryness or eye fatigue noted.     Perception     Praxis      Pertinent Vitals/Pain Pain Assessment: No/denies pain     Hand Dominance Right   Extremity/Trunk Assessment Upper Extremity Assessment Upper Extremity Assessment: Overall WFL for tasks assessed   Lower Extremity Assessment Lower Extremity Assessment: Defer to PT evaluation   Cervical / Trunk Assessment Cervical / Trunk Assessment: Normal   Communication Communication Communication: No difficulties   Cognition Arousal/Alertness: Awake/alert Behavior During Therapy: WFL for tasks assessed/performed Overall Cognitive Status: Within Functional Limits for tasks assessed                                       General Comments  VSS on RA    Exercises     Shoulder Instructions      Home Living Family/patient expects to be discharged to:: Private residence Living Arrangements: Spouse/significant other Available Help at Discharge: Family;Available PRN/intermittently Type of Home: House Home Access: Stairs to enter [employer] of Steps: 3 steps   Home Layout: One level     Bathroom Shower/Tub: Occupational psychologist: Standard     Home Equipment: Counsellor (2 wheels)          Prior Functioning/Environment Prior Level of Function : Independent/Modified Independent             Mobility Comments: works out at gym 5 days per week ADLs Comments: Indpendent        OT Problem List: Decreased strength;Decreased activity tolerance;Impaired vision/perception      OT Treatment/Interventions:      OT Goals(Current goals can be found in the care plan section) Acute Rehab OT Goals Patient  Stated Goal: To go home OT Goal Formulation: All assessment and education complete, DC therapy Time For Goal Achievement: 12/04/20 Potential to Achieve Goals: Good  OT Frequency:     Barriers to D/C:            Co-evaluation              AM-PAC OT "6 Clicks" Daily Activity     Outcome Measure Help from another person eating meals?: None Help from another person taking care of personal grooming?: None Help from another person toileting, which includes using toliet, bedpan, or urinal?: None Help from another person bathing (including washing, rinsing, drying)?: None Help from another person to put on and taking off regular upper body clothing?: None Help from another person to put on and taking off regular lower body clothing?: None 6 Click Score: 24   End of  Session Nurse Communication: Mobility status  Activity Tolerance: Patient tolerated treatment well Patient left: in bed;with call bell/phone within reach  OT Visit Diagnosis: Unsteadiness on feet (R26.81);Muscle weakness (generalized) (M62.81)                Time: 1245-8099 OT Time Calculation (min): 16 min Charges:  OT General Charges $OT Visit: 1 Visit OT Evaluation $OT Eval Low Complexity: [place]., OTR/L Acute Rehabilitation  Araina Butrick Elane Yolanda Bonine 12/04/2020, 12:38 PM

## 2020-12-04 NOTE — Evaluation (Signed)
Physical Therapy Evaluation and Discharge Patient Details Name: Adam Gonzales MRN: [id number] DOB: 10/04/1945 Today's Date: 12/04/2020  History of Present Illness  75 y.o. male who presented 12/02/20 with complaints of double vision after hitting his head on a bar while at the gym5 days ago. CT head 1.8 x 1.1 cm posterior right parietal intraparenchymal hemorrhage with surrounding vasogenic edema and small left subarachnoid hemorrhage of the left MCA cistern.   PMH significant of hypertension, left carotid artery occlusion, gout, and prostate cancer s/p radiation  Clinical Impression   Patient evaluated by Physical Therapy with no further acute PT needs identified.  Patient scored 53/54 on Berg Balance assessment and 24/24 on Dynamic Gait Index. PT is signing off. Thank you for this referral.        Recommendations for follow up therapy are one component of a multi-disciplinary discharge planning process, led by the attending physician.  Recommendations may be updated based on patient status, additional functional criteria and insurance authorization.  Follow Up Recommendations No PT follow up    Assistance Recommended at Discharge None  Functional Status Assessment Patient has not had a recent decline in their functional status  Equipment Recommendations  None recommended by PT    Recommendations for Other Services       Precautions / Restrictions Precautions Precautions: None      Mobility  Bed Mobility Overal bed mobility: Independent                  Transfers Overall transfer level: Independent                      Ambulation/Gait Ambulation/Gait assistance: Independent Gait Distance (Feet): 400 Feet Assistive device: None Gait Pattern/deviations: WFL(Within Functional Limits)   Gait velocity interpretation: >2.62 ft/sec, indicative of community ambulatory General Gait Details: see DGI  Stairs Stairs: Yes Stairs assistance: Independent Stair  Management: No rails;Forwards;Alternating pattern Number of Stairs: 2 General stair comments: no imbalance  Wheelchair Mobility    Modified Rankin (Stroke Patients Only)       Balance Overall balance assessment: Independent                               Standardized Balance Assessment Standardized Balance Assessment : Dynamic Gait Index;Berg Balance Test Berg Balance Test Sit to Stand: Able to stand without using hands and stabilize independently Standing Unsupported: Able to stand safely 2 minutes Sitting with Back Unsupported but Feet Supported on Floor or Stool: Able to sit safely and securely 2 minutes Stand to Sit: Sits safely with minimal use of hands Transfers: Able to transfer safely, minor use of hands Standing Unsupported with Eyes Closed: Able to stand 10 seconds safely Standing Ubsupported with Feet Together: Able to place feet together independently and stand 1 minute safely From Standing, Reach Forward with Outstretched Arm: Can reach confidently >25 cm (10") From Standing Position, Pick up Object from Floor: Able to pick up shoe safely and easily From Standing Position, Turn to Look Behind Over each Shoulder: Looks behind from both sides and weight shifts well Turn 360 Degrees: Able to turn 360 degrees safely in 4 seconds or less Standing Unsupported, Alternately Place Feet on Step/Stool: Able to stand independently and safely and complete 8 steps in 20 seconds Standing Unsupported, One Foot in Front: Able to plae foot ahead of the other independently and hold 30 seconds Standing on One Leg: Able to lift leg  independently and hold equal to or more than 3 seconds Total Score: 53 Dynamic Gait Index Level Surface: Normal Change in Gait Speed: Normal Gait with Horizontal Head Turns: Normal Gait with Vertical Head Turns: Normal Gait and Pivot Turn: Normal Step Over Obstacle: Normal Step Around Obstacles: Normal Steps: Normal Total Score: 24        Pertinent Vitals/Pain Pain Assessment: No/denies pain    Home Living Family/patient expects to be discharged to:: Private residence Living Arrangements: Spouse/significant other Available Help at Discharge: Family;Available PRN/intermittently Type of Home: House           Home Equipment: Counsellor (2 wheels)      Prior Function Prior Level of Function : Independent/Modified Independent             Mobility Comments: works out at gym 5 days per week       Hand Dominance        Extremity/Trunk Assessment   Upper Extremity Assessment Upper Extremity Assessment: Defer to OT evaluation    Lower Extremity Assessment Lower Extremity Assessment: Overall WFL for tasks assessed    Cervical / Trunk Assessment Cervical / Trunk Assessment: Normal  Communication   Communication: No difficulties  Cognition Arousal/Alertness: Awake/alert Behavior During Therapy: WFL for tasks assessed/performed Overall Cognitive Status: Within Functional Limits for tasks assessed                                          General Comments General comments (skin integrity, edema, etc.): Denies changes in vision or double-vision    Exercises     Assessment/Plan    PT Assessment Patient does not need any further PT services  PT Problem List         PT Treatment Interventions      PT Goals (Current goals can be found in the Care Plan section)  Acute Rehab PT Goals Patient Stated Goal: none PT Goal Formulation: All assessment and education complete, DC therapy    Frequency     Barriers to discharge        Co-evaluation               AM-PAC PT "6 Clicks" Mobility  Outcome Measure Help needed turning from your back to your side while in a flat bed without using bedrails?: None Help needed moving from lying on your back to sitting on the side of a flat bed without using bedrails?: None Help needed moving to and from a bed to a chair  (including a wheelchair)?: None Help needed standing up from a chair using your arms (e.g., wheelchair or bedside chair)?: None Help needed to walk in hospital room?: None Help needed climbing 3-5 steps with a railing? : None 6 Click Score: 24    End of Session Equipment Utilized During Treatment: Gait belt Activity Tolerance: Patient tolerated treatment well Patient left: in bed;with call bell/phone within reach Nurse Communication: Mobility status;Other (comment) (no PT or DME needs) PT Visit Diagnosis: Other symptoms and signs involving the nervous system (Q46.962)    Time: 9528-4132 PT Time Calculation (min) (ACUTE ONLY): 15 min   Charges:   PT Evaluation $PT Eval Low Complexity: [place], PT Acute Rehabilitation Services  Pager [phone] Office [phone]   Rexanne Mano 12/04/2020, 12:03 PM

## 2020-12-07 ENCOUNTER — Telehealth: Payer: Self-pay

## 2020-12-07 NOTE — Telephone Encounter (Signed)
Pt called needing a Hospital Follow up next week. 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 Can we use a 3:40 virtual slot for pt to come in office? Please Advise.

## 2020-12-09 NOTE — Telephone Encounter (Signed)
Patient has appointment on 11/07

## 2020-12-09 NOTE — Telephone Encounter (Signed)
Ok to schedule a 40 minute slot at 3:40.  Algis Greenhouse. [name], MD 12/09/2020 8:08 AM

## 2020-12-13 ENCOUNTER — Other Ambulatory Visit: Payer: Self-pay

## 2020-12-13 ENCOUNTER — Other Ambulatory Visit: Payer: Self-pay | Admitting: *Deleted

## 2020-12-13 ENCOUNTER — Telehealth: Payer: Self-pay | Admitting: *Deleted

## 2020-12-13 ENCOUNTER — Ambulatory Visit (INDEPENDENT_AMBULATORY_CARE_PROVIDER_SITE_OTHER): Payer: Medicare Other | Admitting: Family Medicine

## 2020-12-13 VITALS — BP 149/72 | HR 75 | Temp 98.6°F | Ht 70.0 in | Wt 186.0 lb

## 2020-12-13 DIAGNOSIS — I10 Essential (primary) hypertension: Secondary | ICD-10-CM

## 2020-12-13 DIAGNOSIS — I609 Nontraumatic subarachnoid hemorrhage, unspecified: Secondary | ICD-10-CM | POA: Diagnosis not present

## 2020-12-13 DIAGNOSIS — E876 Hypokalemia: Secondary | ICD-10-CM

## 2020-12-13 DIAGNOSIS — I629 Nontraumatic intracranial hemorrhage, unspecified: Secondary | ICD-10-CM

## 2020-12-13 DIAGNOSIS — M1990 Unspecified osteoarthritis, unspecified site: Secondary | ICD-10-CM | POA: Insufficient documentation

## 2020-12-13 LAB — COMPREHENSIVE METABOLIC PANEL
ALT: 16 U/L (ref 0–53)
AST: 20 U/L (ref 0–37)
Albumin: 3.9 g/dL (ref 3.5–5.2)
Alkaline Phosphatase: 55 U/L (ref 39–117)
BUN: 19 mg/dL (ref 6–23)
CO2: 28 mEq/L (ref 19–32)
Calcium: 9.6 mg/dL (ref 8.4–10.5)
Chloride: 103 mEq/L (ref 96–112)
Creatinine, Ser: 1.01 mg/dL (ref 0.40–1.50)
GFR: 74.04 mL/min (ref >60.00)
Glucose, Bld: 114 mg/dL — ABNORMAL HIGH (ref 70–99)
Potassium: 2.8 mEq/L — CL (ref 3.5–5.1)
Sodium: 139 mEq/L (ref 135–145)
Total Bilirubin: 0.8 mg/dL (ref 0.2–1.2)
Total Protein: 6.7 g/dL (ref 6.0–8.3)

## 2020-12-13 LAB — CBC
HCT: 36.8 % — ABNORMAL LOW (ref 39.0–52.0)
Hemoglobin: 12.6 g/dL — ABNORMAL LOW (ref 13.0–17.0)
MCHC: 34.1 g/dL (ref 30.0–36.0)
MCV: 88.8 fl (ref 78.0–100.0)
Platelets: 295 10*3/uL (ref 150.0–400.0)
RBC: 4.14 Mil/uL — ABNORMAL LOW (ref 4.22–5.81)
RDW: 14.4 % (ref 11.5–15.5)
WBC: 8.6 10*3/uL (ref 4.0–10.5)

## 2020-12-13 NOTE — Assessment & Plan Note (Addendum)
Reassuring neurologic exam today.  Symptoms are gradually improving.  We will repeat CT scan in a few weeks per neurology recommendations. He will follow-up with neurology next month.

## 2020-12-13 NOTE — Assessment & Plan Note (Signed)
At goal today on amlodipine 5 mg daily and HCTZ 12.5 mg daily.  He is no longer on spironolactone or lisinopril since his hospitalization.  We will continue current regimen.  Continue home monitoring goal 150/90 or lower per [facility] 8.

## 2020-12-13 NOTE — Assessment & Plan Note (Signed)
Recommended against chronic NSAID use.  He can use topical Voltaren.

## 2020-12-13 NOTE — Telephone Encounter (Signed)
Please see result note.  Adam Gonzales. [name], MD 12/13/2020 3:23 PM

## 2020-12-13 NOTE — Assessment & Plan Note (Signed)
Symptoms are improving.  Continue management per neurology.

## 2020-12-13 NOTE — Progress Notes (Signed)
Chief Complaint:  Adam Gonzales is a 75 y.o. male who presents today for a TCM visit.  Assessment/Plan:  No problem-specific Assessment & Plan notes found for this encounter.     Subjective:  HPI:  Summary of Hospital admission: Reason for admission: Headache and Double vision Date of admission: 12/02/2020  Date of discharge: 12/04/2020  Summary of Hospital course: Pt began experiencing pain after hitting his head against his weightlifting bar at the gym, resulting in a headache for the rest of the day. 11/29/2020 He experienced nausea, and dizziness. 10/27 While driving he noted double vision and went to the ER, where a head CT was performed. This found a right parietal small IPH and a small subarachnoid hemorrhage in left MCA cistern.  Urology was consulted and he was admitted.  His symptoms improved and he was discharged home to follow-up with neurology.  During hospitalization found to have AKI.  His lisinopril and spironolactone were stopped.  Started on amlodipine.  Renal function was back to near baseline at time of discharge.   Interim history:   Doing well since being home.  His headache and double vision have improved significantly but the headache is still present, with pain in the front and back of his head. He states that the double vision has been replaced with "spatial incorrectness", which he states is not always present. There is also a blurriness in his vision which seems to be present more often.  Also, he notes that he has had changes in food interests and pickiness. He has also noted particular food cravings.  He still goes to the gym to exercise, but does not do strenuous activity or biking. He denies having pain or having any other conditions associated with exercise.  When waking up in the morning, he has a "swimmy head" sensation for around 5 seconds, which then resolves. He also notes having increased difficulty in want to be at work within the first  hour, but that this subsides after that hour and he feels fine about being at work. He admits to light sensitivity during eye exam.  BP Readings from Last 3 Encounters:  12/13/20 (!) 149/72  12/04/20 127/84  09/14/20 (!) 155/78   This morning his right foot pain was notably worse. He says this pain is currently his biggest issue.    ROS: Per HPI, otherwise a complete review of systems was negative.   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   Gout    Hypertension    Prostate cancer ([place])    Quadriceps tendon rupture, right, initial encounter    Patient Active Problem List   Diagnosis Date Noted   Intracranial bleed ([place]) 12/03/2020   Subarachnoid hemorrhage 12/03/2020   Acute kidney injury superimposed on chronic kidney disease ([place]) 12/03/2020   Hypokalemia 12/03/2020   BPH (benign prostatic hyperplasia) 12/03/2020   Actinic keratosis 09/14/2020   Hyperglycemia 09/30/2019   Malignant neoplasm of prostate ([place]) 03/18/2019   CKD (chronic kidney disease) stage 2, GFR 60-89 ml/min 01/02/2018   HTN (hypertension) 01/02/2018   Carotid occlusion, left 04/23/2017   Past Surgical History:  Procedure Laterality Date   CYST EXCISION     HYDROCELE EXCISION Right    PROSTATE BIOPSY     QUADRICEPS TENDON REPAIR Right 01/14/2018   Procedure: REPAIR RIGHT QUADRICEPS TENDON;  Surgeon: Leandrew Koyanagi, MD;  Location: [facility];  Service: Orthopedics;  Laterality: Right;    Family History  Problem Relation Age of Onset   Heart failure Mother    Cancer - Colon Father    Dementia Sister    Throat cancer Brother    Pancreatic cancer Neg Hx    Breast cancer Neg Hx    Prostate cancer Neg Hx     Medications- Reconciled discharge and current medications in Epic.  Current Outpatient Medications  Medication Sig Dispense Refill   amLODipine (NORVASC) 5 MG tablet Take 1 tablet  (5 mg total) by mouth daily. 30 tablet 0   colchicine 0.6 MG tablet Take 0.6 mg by mouth as needed (gout flares).     hydrochlorothiazide (HYDRODIURIL) 12.5 MG tablet Take 12.5 mg by mouth daily.     potassium chloride SA (KLOR-CON) 20 MEQ tablet Take 1 tablet (20 mEq total) by mouth daily. 30 tablet 0   tamsulosin (FLOMAX) 0.4 MG CAPS capsule Take 0.4 mg by mouth daily.     No current facility-administered medications for this visit.    Allergies-reviewed and updated No Known Allergies  Social History   Socioeconomic History   Marital status: Married    Spouse name: Not on file   Number of children: Not on file   Years of education: Not on file   Highest education level: Not on file  Occupational History   Not on file  Tobacco Use   Smoking status: Never   Smokeless tobacco: Never  Vaping Use   Vaping Use: Never used  Substance and Sexual Activity   Alcohol use: Not Currently    Comment: social   Drug use: No   Sexual activity: Not Currently  Other Topics Concern   Not on file  Social History Narrative   Not on file   Social Determinants of Health   Financial Resource Strain: Low Risk    Difficulty of Paying Living Expenses: Not hard at all  Food Insecurity: No Food Insecurity   Worried About Charity fundraiser in the Last Year: Never true   [place] in the Last Year: Never true  Transportation Needs: No Transportation Needs   Lack of Transportation (Medical): No   Lack of Transportation (Non-Medical): No  Physical Activity: Sufficiently Active   Days of Exercise per Week: 6 days   Minutes of Exercise per Session: 90 min  Stress: No Stress Concern Present   Feeling of Stress : Not at all  Social Connections: Moderately Integrated   Frequency of Communication with Friends and Family: More than three times a week   Frequency of Social Gatherings with Friends and Family: Three times a week   Attends Religious Services: 1 to 4 times per year   Active  Member of Clubs or Organizations: No   Attends Archivist Meetings: Never   Marital Status: Married        Objective:  Physical Exam: There were no vitals taken for this visit.  Gen: NAD, resting comfortably CV: RRR with no murmurs appreciated Pulm: NWOB, CTAB with no crackles, wheezes, or rhonchi GI: Normal bowel sounds present. Soft, Nontender, Nondistended. MSK: No edema, cyanosis, or clubbing noted Skin: Warm, dry Neuro: CN2-12 intact. Finger-nose.finger testing intact bilaterally.  Reflexes 2+ and symmetric bilaterally in upper and lower extremities Psych: Normal affect and thought content       I,Jordan Kelly,acting as a scribe for Dimas Chyle, MD.,have documented all relevant documentation on the behalf of Dimas Chyle, MD,as directed by  Dimas Chyle, MD while in the presence of Dimas Chyle, MD.  I, Dimas Chyle, MD, have reviewed all documentation for this visit. The documentation on 12/13/20 for the exam, diagnosis, procedures, and orders are all accurate and complete.  Algis Greenhouse. [name], MD 12/13/2020 7:56 AM   Time Spent: 45 minutes of total time was spent on the date of the encounter performing the following actions: chart review prior to seeing the patient including his recent hospitalization, obtaining history, performing a medically necessary exam, counseling on the treatment plan, placing orders, and documenting in our EHR.

## 2020-12-13 NOTE — Telephone Encounter (Signed)
Adam Gonzales from [facility] lab call with critical lab results  Low potassium 2.8

## 2020-12-13 NOTE — Progress Notes (Signed)
Please inform patient of the following:  Potassium is low. This is probably due to his HCTZ. Please have him stop his HCTZ and increase his amlodipine to 10mg  daily. I would like for him to come back in a few days to recheck BMET. HE does not have to take a potassium supplement but it would be a good idea for him to eat foods rich in potassium like bananas.  Adam Gonzales. [name], MD 12/13/2020 3:22 PM

## 2020-12-13 NOTE — Patient Instructions (Signed)
It was very nice to see you today!  We will check blood work today.  Please stop your potassium supplement.  No other medication changes today  Please let us know if your symptoms worsen.  Take care, Dr [name]  PLEASE NOTE:  If you had any lab tests please let us know if you have not heard back within a few days. You may see your results on mychart before we have a chance to review them but we will give you a call once they are reviewed by [place]. If we ordered any referrals today, please let us know if you have not heard from their office within the next week.   Please try these tips to maintain a healthy lifestyle:  Eat at least 3 REAL meals and 1-2 snacks per day.  Aim for no more than 5 hours between eating.  If you eat breakfast, please do so within one hour of getting up.   Each meal should contain half fruits/vegetables, one quarter protein, and one quarter carbs (no bigger than a computer mouse)  Cut down on sweet beverages. This includes juice, soda, and sweet tea.   Drink at least 1 glass of water with each meal and aim for at least 8 glasses per day  Exercise at least 150 minutes every week.    Returning to Sports and Activities After a Concussion, Adult Knowing when to return to sports and activities after a concussion is important. Make sure you wait to return to activity until after both of these have occurred: Your symptoms are completely gone. A health care provider says it is safe. Returning to activity too soon increases the risk of serious and long-lasting symptoms. You may also need to take time away from work or other activities that require concentration, depending on how severe your concussion is. Concussions can have serious effects on your brain. People who have more than one concussion are at greater risk of having long-term (chronic) headaches and problems with learning. When can I return to sports or other activities? You should stop participating in an activity  right away after you hit your head or a concussion is suspected. You need to rest physically and mentally. You should also be monitored carefully by another adult. How quickly you can return to sports and other activities depends on: Your age. The severity of your concussion. Your health before the injury. Whether you have had a previous concussion. How should I gradually return to sports or other activities? You should not return to sports or activities until you are symptom-free without medicine for at least 24 hours. Your health care provider will determine when your symptoms are completely gone and when it is safe for you to practice and play sports again. Make sure you return to sports gradually. Do not try to do too much too soon. Gradually advance through the following activity levels to return to sports: Begin with only light aerobic activity to increase your heart rate. You may bike, walk, or jog for up to 10 minutes. Do not jump, run, or lift weights. Get moderate physical activity with some head and body movements. Running short distances, fast jogging, using a stationary bike, and moderate-intensity weight lifting are okay. Participate in high-intensity exercise without physical contact. Return to your normal practice routine, which may include full contact. Return to play in games, matches, or other competitions. Some people can progress quickly through these levels. Other people will need several days to go from one level to  the next. Do not move on to the next level until you have been symptom-free for at least 24 hours after doing activity at the previous level. Symptoms to watch for include: Tiredness (fatigue). Headache. Problems with balance, coordination, or memory. If you notice any of these warning signs during exercise or other physical activities, rest for at least 24 hours or until the symptoms go away. You can then return to activity. Start at the activity level that you  were on before your symptoms began. What symptoms are important to report to my health care provider? Concussion symptoms may not appear right away. They could also get worse at any time. It is important to let your health care provider know if you have any new or worsening symptoms. Watch for these symptoms: Physical symptoms Headaches. Dizziness and problems with coordination or balance. Sensitivity to light or noise. Nausea or vomiting. Tiredness (fatigue). Vision or hearing problems. Seizure. Mental and emotional symptoms Irritability or mood changes. Memory problems. Trouble concentrating, organizing, or making decisions. Changes in eating or sleeping patterns. Slowness in thinking, acting or reacting, speaking, or reading. Anxiety or depression. Certain health issues may make your recovery from a concussion take longer. Let your health care provider know if you have a history of: Migraines. Depression. Mood disorders. Anxiety. A developmental disorder, such as ADHD. Any previous concussions. What are some questions to ask my health care provider? When you have a concussion, learning as much as you can about your injury can help you protect your long-term health. Ask your health care provider the following questions: Questions about recovery and treatment Is it safe for me to return to sports or other physical activities? Should I limit how much time I watch TV, play video games, or use a computer? Do I need to take time away from work? Do I need more sleep than normal? Questions about the effects of a concussion What are the short-term and long-term consequences of my injury? How might the concussion affect my professional life? Could I have problems with memory or learning? Questions about getting another concussion When should I go to the emergency room? What happens if I get another concussion? What are the warning signs of another concussion? Could I have a  concussion without knowing it? How can I prevent another concussion from happening? Should I consider not playing sports anymore? Where to find more information Centers for Disease Control and Prevention: http://www.wolf.info/ Summary Returning to activity too soon after a concussion increases the risk of serious and long-lasting symptoms. After a concussion, you must rest physically and mentally until your symptoms are gone. Your health care provider will determine when it is safe for you to return to sports, work, and other activities that require concentration. Returning to activity is a slow process, starting with limited activity and monitoring for symptoms. If symptoms do not occur with activity, the intensity of activity can be increased. Make sure you talk with your health care provider before returning to activity. This information is not intended to replace advice given to you by your health care provider. Make sure you discuss any questions you have with your health care provider. Document Revised: 09/04/2018 Document Reviewed: 09/04/2018 Elsevier Patient Education  [place].

## 2020-12-17 ENCOUNTER — Other Ambulatory Visit: Payer: Self-pay

## 2020-12-17 ENCOUNTER — Other Ambulatory Visit: Payer: Medicare Other

## 2020-12-17 DIAGNOSIS — E876 Hypokalemia: Secondary | ICD-10-CM | POA: Diagnosis not present

## 2020-12-17 NOTE — Addendum Note (Signed)
Addended by: Loura Back on: 12/17/2020 03:18 PM   Modules accepted: Orders

## 2020-12-18 LAB — COMPREHENSIVE METABOLIC PANEL
AG Ratio: 1.7 (calc) (ref 1.0–2.5)
ALT: 12 U/L (ref 9–46)
AST: 18 U/L (ref 10–35)
Albumin: 3.9 g/dL (ref 3.6–5.1)
Alkaline phosphatase (APISO): 55 U/L (ref 35–144)
BUN: 13 mg/dL (ref 7–25)
CO2: 26 mmol/L (ref 20–32)
Calcium: 9.2 mg/dL (ref 8.6–10.3)
Chloride: 102 mmol/L (ref 98–110)
Creat: 1.11 mg/dL (ref 0.70–1.28)
Globulin: 2.3 g/dL (calc) (ref 1.9–3.7)
Glucose, Bld: 101 mg/dL — ABNORMAL HIGH (ref 65–99)
Potassium: 3.6 mmol/L (ref 3.5–5.3)
Sodium: 137 mmol/L (ref 135–146)
Total Bilirubin: 0.5 mg/dL (ref 0.2–1.2)
Total Protein: 6.2 g/dL (ref 6.1–8.1)

## 2020-12-20 NOTE — Progress Notes (Signed)
Please inform patient of the following:  Potassium levels are back to normal. Would like for him to stay off the HCTZ for now and stay with amlodipine 10mg  daily. Would like for him to come back in for a BP check in a week or two.  Algis Greenhouse. [name], MD 12/20/2020 8:01 AM

## 2020-12-22 ENCOUNTER — Encounter: Payer: Self-pay | Admitting: Family Medicine

## 2020-12-28 ENCOUNTER — Other Ambulatory Visit: Payer: Self-pay

## 2020-12-28 ENCOUNTER — Inpatient Hospital Stay: Admission: RE | Admit: 2020-12-28 | Payer: Medicare Other | Source: Ambulatory Visit

## 2020-12-28 ENCOUNTER — Ambulatory Visit (INDEPENDENT_AMBULATORY_CARE_PROVIDER_SITE_OTHER)
Admission: RE | Admit: 2020-12-28 | Discharge: 2020-12-28 | Disposition: A | Discharge status: Home / Self Care | Payer: Medicare Other | Source: Ambulatory Visit | Attending: Family Medicine | Admitting: Family Medicine

## 2020-12-28 DIAGNOSIS — J329 Chronic sinusitis, unspecified: Secondary | ICD-10-CM | POA: Diagnosis not present

## 2020-12-28 DIAGNOSIS — I629 Nontraumatic intracranial hemorrhage, unspecified: Secondary | ICD-10-CM

## 2020-12-28 DIAGNOSIS — J3489 Other specified disorders of nose and nasal sinuses: Secondary | ICD-10-CM | POA: Diagnosis not present

## 2020-12-29 ENCOUNTER — Other Ambulatory Visit: Payer: Self-pay | Admitting: *Deleted

## 2020-12-29 ENCOUNTER — Telehealth: Payer: Self-pay

## 2020-12-29 DIAGNOSIS — H6980 Other specified disorders of Eustachian tube, unspecified ear: Secondary | ICD-10-CM

## 2020-12-29 NOTE — Telephone Encounter (Signed)
LVM for patient to return call. 

## 2020-12-29 NOTE — Telephone Encounter (Signed)
Patient is calling in stating that he had another question in regards to his CT scan results. Wanting to know if someone is able to give me a call back.

## 2020-12-29 NOTE — Progress Notes (Signed)
Please inform patient of the following:  CT scans shows resolution of the bleeding he had. He does have a small amount of sinus inflammation but if he is not having any symptoms we do not need to do anything about this. HE has other age related changes that are not a concern.   Algis Greenhouse. [name], MD 12/29/2020 7:48 AM

## 2021-01-06 ENCOUNTER — Ambulatory Visit (INDEPENDENT_AMBULATORY_CARE_PROVIDER_SITE_OTHER): Payer: Medicare Other | Admitting: Family Medicine

## 2021-01-06 ENCOUNTER — Encounter: Payer: Self-pay | Admitting: Family Medicine

## 2021-01-06 ENCOUNTER — Other Ambulatory Visit: Payer: Self-pay

## 2021-01-06 VITALS — BP 177/77 | HR 60 | Temp 98.0°F | Ht 70.0 in | Wt 184.8 lb

## 2021-01-06 DIAGNOSIS — I10 Essential (primary) hypertension: Secondary | ICD-10-CM

## 2021-01-06 DIAGNOSIS — H6980 Other specified disorders of Eustachian tube, unspecified ear: Secondary | ICD-10-CM | POA: Diagnosis not present

## 2021-01-06 DIAGNOSIS — M1990 Unspecified osteoarthritis, unspecified site: Secondary | ICD-10-CM | POA: Diagnosis not present

## 2021-01-06 DIAGNOSIS — Z23 Encounter for immunization: Secondary | ICD-10-CM

## 2021-01-06 DIAGNOSIS — J329 Chronic sinusitis, unspecified: Secondary | ICD-10-CM

## 2021-01-06 MED ORDER — AZELASTINE HCL 0.1 % NA SOLN: 2.0000 | Freq: Two times a day (BID) | NASAL | 12 refills | Status: AC

## 2021-01-06 NOTE — Assessment & Plan Note (Signed)
Blood pressure has been well controlled at home.  It is elevated today.  He will continue home monitoring and let me know if blood pressures are persistently 150/90 or higher.

## 2021-01-06 NOTE — Assessment & Plan Note (Signed)
Recommended over-the-counter Voltaren.

## 2021-01-06 NOTE — Progress Notes (Signed)
   Adam Gonzales is a 75 y.o. male who presents today for an office visit.  Assessment/Plan:  New/Acute Problems: Sinusitis/eustachian tube dysfunction Will refer to ENT. Start astelin nasal spray.   Chronic Problems Addressed Today: Osteoarthritis Recommended over-the-counter Voltaren.  HTN (hypertension) Blood pressure has been well controlled at home.  It is elevated today.  He will continue home monitoring and let me know if blood pressures are persistently 150/90 or higher.     Subjective:  HPI:  He is here with right ear issues. This has been going on for several years. He is concerned about possible eustachain tube dysfunction. Also noticed more ear drainage at night. He notes he feel like his ear is clogged/ blocked. He also noticed his eustachian tube  pops open when he yawn. He has seen ENT in the past but unfortunately it did not work for him. He would seen new ENT for this issue.  Flonase did not help.         Objective:  Physical Exam: BP (!) 177/77   Pulse 60   Temp 98 F (36.7 C) (Temporal)   Ht 5\' 10"  (1.778 m)   Wt 184 lb 12.8 oz (83.8 kg)   SpO2 98%   BMI 26.52 kg/m   Gen: No acute distress, resting comfortably HEENT: TM clear.  OP erythematous.  Nasal mucosa erythematous and boggy bilaterally with clear discharge. CV: Regular rate and rhythm with no murmurs appreciated Pulm: Normal work of breathing, clear to auscultation bilaterally with no crackles, wheezes, or rhonchi Neuro: Grossly normal, moves all extremities Psych: Normal affect and thought content       I,Savera Zaman,acting as a scribe for Dimas Chyle, MD.,have documented all relevant documentation on the behalf of Dimas Chyle, MD,as directed by  Dimas Chyle, MD while in the presence of Dimas Chyle, MD.   I, Dimas Chyle, MD, have reviewed all documentation for this visit. The documentation on 01/06/21 for the exam, diagnosis, procedures, and orders are all accurate and  complete.  Algis Greenhouse. [name], MD 01/06/2021 3:00 PM

## 2021-01-06 NOTE — Patient Instructions (Signed)
It was very nice to see you today!  Please try the Astelin nasal spray.  I will also refer you to see ear nose and throat.  Please try using voltaren for you joint pain.  Keep an eye on your BP and let me know if it is persistently elevated to 150/90 or higher.   Take care, Dr [name]  PLEASE NOTE:  If you had any lab tests please let us know if you have not heard back within a few days. You may see your results on mychart before we have a chance to review them but we will give you a call once they are reviewed by [place]. If we ordered any referrals today, please let us know if you have not heard from their office within the next week.   Please try these tips to maintain a healthy lifestyle:  Eat at least 3 REAL meals and 1-2 snacks per day.  Aim for no more than 5 hours between eating.  If you eat breakfast, please do so within one hour of getting up.   Each meal should contain half fruits/vegetables, one quarter protein, and one quarter carbs (no bigger than a computer mouse)  Cut down on sweet beverages. This includes juice, soda, and sweet tea.   Drink at least 1 glass of water with each meal and aim for at least 8 glasses per day  Exercise at least 150 minutes every week.

## 2021-01-19 ENCOUNTER — Inpatient Hospital Stay: Payer: Self-pay | Admitting: Adult Health

## 2021-01-20 ENCOUNTER — Ambulatory Visit: Payer: Medicare Other | Admitting: Podiatry

## 2021-01-20 ENCOUNTER — Ambulatory Visit (INDEPENDENT_AMBULATORY_CARE_PROVIDER_SITE_OTHER): Payer: Medicare Other

## 2021-01-20 ENCOUNTER — Encounter: Payer: Self-pay | Admitting: Podiatry

## 2021-01-20 ENCOUNTER — Other Ambulatory Visit: Payer: Self-pay

## 2021-01-20 DIAGNOSIS — M778 Other enthesopathies, not elsewhere classified: Secondary | ICD-10-CM | POA: Diagnosis not present

## 2021-01-20 DIAGNOSIS — M109 Gout, unspecified: Secondary | ICD-10-CM | POA: Diagnosis not present

## 2021-01-20 MED ORDER — MELOXICAM 15 MG PO TABS: 15.0000 mg | ORAL_TABLET | Freq: Every day | ORAL | 3 refills | Status: DC

## 2021-01-20 MED ORDER — METHYLPREDNISOLONE 4 MG PO TBPK: ORAL_TABLET | ORAL | 0 refills | Status: DC

## 2021-01-22 NOTE — Progress Notes (Signed)
Subjective:  Patient ID: Adam Gonzales, male    DOB: 06-Apr-1945,  MRN: [id number] HPI Chief Complaint  Patient presents with   Foot Pain    Multiple areas bilateral - patient states both feet ache intermittently x months, areas change from day to day, AM pain, tried voltaren gel    New Patient (Initial Visit)    75 y.o. male presents with the above complaint.   ROS: Denies fever chills nausea vomiting muscle aches pains calf pain back pain chest pain shortness of breath.  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   Gout    Hypertension    Prostate cancer ([place])    Quadriceps tendon rupture, right, initial encounter    Past Surgical History:  Procedure Laterality Date   CYST EXCISION     HYDROCELE EXCISION Right    PROSTATE BIOPSY     QUADRICEPS TENDON REPAIR Right 01/14/2018   Procedure: REPAIR RIGHT QUADRICEPS TENDON;  Surgeon: Leandrew Koyanagi, MD;  Location: [place];  Service: Orthopedics;  Laterality: Right;    Current Outpatient Medications:    colchicine 0.6 MG tablet, Take 0.6 mg by mouth daily., Disp: , Rfl:    diclofenac Sodium (VOLTAREN) 1 % GEL, Apply topically 4 (four) times daily., Disp: , Rfl:    meloxicam (MOBIC) 15 MG tablet, Take 1 tablet (15 mg total) by mouth daily., Disp: 30 tablet, Rfl: 3   methylPREDNISolone (MEDROL DOSEPAK) 4 MG TBPK tablet, 6 day dose pack - take as directed, Disp: 21 tablet, Rfl: 0   azelastine (ASTELIN) 0.1 % nasal spray, Place 2 sprays into both nostrils 2 (two) times daily., Disp: 30 mL, Rfl: 12   tamsulosin (FLOMAX) 0.4 MG CAPS capsule, Take 0.4 mg by mouth daily., Disp: , Rfl:   No Known Allergies Review of Systems Objective:  There were no vitals filed for this visit.  General: Well developed, nourished, in no acute distress, alert and oriented x3   Dermatological: Skin is warm, dry and supple bilateral. Nails x 10 are well maintained; remaining integument appears  unremarkable at this time. There are no open sores, no preulcerative lesions, no rash or signs of infection present.  Vascular: Dorsalis Pedis artery and Posterior Tibial artery pedal pulses are 2/4 bilateral with immedate capillary fill time. Pedal hair growth present. No varicosities and moderate pitting lower extremity edema present bilateral.   Neruologic: Grossly intact via light touch bilateral. Vibratory intact via tuning fork bilateral. Protective threshold with Semmes Wienstein monofilament intact to all pedal sites bilateral. Patellar and Achilles deep tendon reflexes 2+ bilateral. No Babinski or clonus noted bilateral.   Musculoskeletal: No gross boney pedal deformities bilateral. No pain, crepitus, or limitation noted with foot and ankle range of motion bilateral. Muscular strength 5/5 in all groups tested bilateral.  No reproducible pain today.  He does have limited range of motion of the first metatarsophalangeal joints bilateral with some mild tenderness on frontal plane range of motion at the tarsometatarsal joints.  Gait: Unassisted, gait appears to be slightly antalgic.   Radiographs:   Radiographs taken today right foot demonstrates mild pes planus significant osteoarthritic changes talonavicular joint navicular cuneiform joint first TMT joint and first metatarsophalangeal joint with near complete joint space loss of the first metatarsophalangeal joint dorsal plantar view.  Spurring is also noted dorsally laterally and medially subchondral sclerosis and eburnation.  Left foot demonstrates delta phalanx second digit left osteoarthritic changes sesamoids and first metatarsophalangeal joint as  well as nearly all of the tarsometatarsal joints plantar distally oriented calcaneal heel spur 68 significant soft tissue swelling bilateral lower extremity.  Assessment & Plan:   Assessment: Cannot rule out a seropositive arthropathy although was osteoarthritis of the bilateral lower extremity  and mild pes planus.  Plan: Discussed etiology pathology conservative versus surgical therapies.  I am concerned about the edema in his legs which is gone talk to his primary care provider about are also going to get him started on methylprednisolone to be followed by meloxicam.  I am also going to request blood work consisting of a CMP CBC and an arthritic profile.     Nica Friske T. [place], [place]

## 2021-01-24 LAB — CBC WITH DIFFERENTIAL/PLATELET
Absolute Monocytes: 813 cells/uL (ref 200–950)
Basophils Absolute: 30 cells/uL (ref 0–200)
Basophils Relative: 0.4 %
Eosinophils Absolute: 129 cells/uL (ref 15–500)
Eosinophils Relative: 1.7 %
HCT: 35.8 % — ABNORMAL LOW (ref 38.5–50.0)
Hemoglobin: 12 g/dL — ABNORMAL LOW (ref 13.2–17.1)
Lymphs Abs: 623 cells/uL — ABNORMAL LOW (ref 850–3900)
MCH: 29.6 pg (ref 27.0–33.0)
MCHC: 33.5 g/dL (ref 32.0–36.0)
MCV: 88.2 fL (ref 80.0–100.0)
MPV: 9.9 fL (ref 7.5–12.5)
Monocytes Relative: 10.7 %
Neutro Abs: 6004 cells/uL (ref 1500–7800)
Neutrophils Relative %: 79 %
Platelets: 302 10*3/uL (ref 140–400)
RBC: 4.06 10*6/uL — ABNORMAL LOW (ref 4.20–5.80)
RDW: 13.6 % (ref 11.0–15.0)
Total Lymphocyte: 8.2 %
WBC: 7.6 10*3/uL (ref 3.8–10.8)

## 2021-01-24 LAB — ANA: Anti Nuclear Antibody (ANA): POSITIVE — AB

## 2021-01-24 LAB — COMPREHENSIVE METABOLIC PANEL
AG Ratio: 1.4 (calc) (ref 1.0–2.5)
ALT: 12 U/L (ref 9–46)
AST: 20 U/L (ref 10–35)
Albumin: 3.9 g/dL (ref 3.6–5.1)
Alkaline phosphatase (APISO): 58 U/L (ref 35–144)
BUN: 16 mg/dL (ref 7–25)
CO2: 27 mmol/L (ref 20–32)
Calcium: 9.5 mg/dL (ref 8.6–10.3)
Chloride: 104 mmol/L (ref 98–110)
Creat: 0.97 mg/dL (ref 0.70–1.28)
Globulin: 2.7 g/dL (calc) (ref 1.9–3.7)
Glucose, Bld: 95 mg/dL (ref 65–99)
Potassium: 3.6 mmol/L (ref 3.5–5.3)
Sodium: 141 mmol/L (ref 135–146)
Total Bilirubin: 0.7 mg/dL (ref 0.2–1.2)
Total Protein: 6.6 g/dL (ref 6.1–8.1)

## 2021-01-24 LAB — ANTI-NUCLEAR AB-TITER (ANA TITER): ANA Titer 1: 1:80 {titer} — ABNORMAL HIGH

## 2021-01-24 LAB — RHEUMATOID FACTOR: Rheumatoid fact SerPl-aCnc: <14 IU/mL (ref <14)

## 2021-01-24 LAB — C-REACTIVE PROTEIN: CRP: 25 mg/L — ABNORMAL HIGH (ref <8.0)

## 2021-01-24 LAB — SEDIMENTATION RATE: Sed Rate: 31 mm/h — ABNORMAL HIGH (ref 0–20)

## 2021-01-24 LAB — URIC ACID: Uric Acid, Serum: 5.4 mg/dL (ref 4.0–8.0)

## 2021-01-25 ENCOUNTER — Telehealth: Payer: Self-pay | Admitting: *Deleted

## 2021-01-25 DIAGNOSIS — R768 Other specified abnormal immunological findings in serum: Secondary | ICD-10-CM

## 2021-01-25 NOTE — Telephone Encounter (Signed)
Ammie notified patient referral was sent in

## 2021-01-25 NOTE — Telephone Encounter (Signed)
-----   Message from Garrel Ridgel, [place] sent at 01/25/2021  7:23 AM EST ----- Let him know that his numbers were just a little off and I would feel better if he followed up with a Rheumatologist.  Please make that referral.  Thanks.

## 2021-01-25 NOTE — Telephone Encounter (Signed)
Patient is calling for lab results done last Friday,unable to interpret on Mychart, needs clarification and has questions.Please advise.

## 2021-01-25 NOTE — Telephone Encounter (Signed)
Called patient, no answer, left vmessage for call back results

## 2021-01-26 NOTE — Telephone Encounter (Signed)
Returned the call to patient, no answer, left vmessage to call back for results and physician's recommendations

## 2021-01-26 NOTE — Telephone Encounter (Signed)
Called, no answer, left message for call back concerning physician's results and recommendations.

## 2021-02-09 ENCOUNTER — Encounter: Payer: Self-pay | Admitting: *Deleted

## 2021-02-23 DIAGNOSIS — N182 Chronic kidney disease, stage 2 (mild): Secondary | ICD-10-CM | POA: Diagnosis not present

## 2021-03-01 DIAGNOSIS — I129 Hypertensive chronic kidney disease with stage 1 through stage 4 chronic kidney disease, or unspecified chronic kidney disease: Secondary | ICD-10-CM | POA: Diagnosis not present

## 2021-03-01 DIAGNOSIS — E269 Hyperaldosteronism, unspecified: Secondary | ICD-10-CM | POA: Diagnosis not present

## 2021-03-03 DIAGNOSIS — I129 Hypertensive chronic kidney disease with stage 1 through stage 4 chronic kidney disease, or unspecified chronic kidney disease: Secondary | ICD-10-CM | POA: Diagnosis not present

## 2021-03-14 DIAGNOSIS — I129 Hypertensive chronic kidney disease with stage 1 through stage 4 chronic kidney disease, or unspecified chronic kidney disease: Secondary | ICD-10-CM | POA: Diagnosis not present

## 2021-03-15 ENCOUNTER — Ambulatory Visit: Payer: Medicare Other | Admitting: Adult Health

## 2021-03-15 ENCOUNTER — Encounter: Payer: Self-pay | Admitting: Adult Health

## 2021-03-15 VITALS — BP 132/80 | HR 84 | Ht 70.0 in | Wt 183.0 lb

## 2021-03-15 DIAGNOSIS — R58 Hemorrhage, not elsewhere classified: Secondary | ICD-10-CM | POA: Diagnosis not present

## 2021-03-15 DIAGNOSIS — I6522 Occlusion and stenosis of left carotid artery: Secondary | ICD-10-CM | POA: Diagnosis not present

## 2021-03-15 MED ORDER — ASPIRIN EC 81 MG PO TBEC: 81.0000 mg | DELAYED_RELEASE_TABLET | Freq: Every day | ORAL | 11 refills | Status: AC

## 2021-03-15 NOTE — Progress Notes (Signed)
[facility] [address] [place] [address] [phone]       OFFICE FOLLOW UP NOTE  [name] Date of Birth:  03-04-1945 Medical Record Number:  [id number]   Reason for Referral:  hospital follow up    SUBJECTIVE:   CHIEF COMPLAINT:  Chief Complaint  Patient presents with   Follow-up    Rm 3 alone here for hospital follow up. Pt reports he has been doing well since d/c home.    HPI:   [name]  with minor head trauma on 11/28/2020 while at the gym and hit his head on a weightlifting bar with significant residual headache but then the following day, developed nausea and feeling off. On 10/27, started to have diplopia and therefore presented to ED.   Personally reviewed hospitalization pertinent progress notes, lab work and imaging.  Evaluated by Dr. Erlinda Hong with CT most suggestive of traumatic coup/contrecoup injury, Dr. Erlinda Hong  felt further investigation with an MRI would be helpful to rule out other causes such as multifocal emboli with hemorrhagic conversion or metastatic disease, etc. Neuroimaging reveals right IPH and left SAH. No IVH. On aspirin PTA - held at discharge. Recommended f/u imaging 3 weeks post discharge and if resolved, cleared to restart asa. Known chronic L ICA occlusion stable on MRA head/neck. Symptoms improved and discharged home.    Today, 03/15/2021, patient being seen for initial hospital follow up. Overall doing well. Denies any residual deficits or new symptoms. Did have repeat [facility] 12/28/2020 which showed resolution of prior ICH. He has not yet been restarted back on aspirin - he questions if aspirin is needed to be restarted as he is unsure why or who started this to begin with but was routinely taking PTA. Blood pressure today 132/80. Routinely follows with PCP. No concerns at this time.      PERTINENT IMAGING  CT HEAD 12/28/2020 IMPRESSION: 1. No acute intracranial pathology. 2. Interval  resolution of the previously seen right parietal convexity intraparenchymal hemorrhage. Small cortical hypodensity over the right parietal convexity sequela of prior hemorrhagic infarct. 3. Mild age-related atrophy and chronic microvascular ischemic changes. 4. Paranasal sinus disease.    Per hospitalization 12/02/2020 CT head: admission:  Small volume parenchymal, subdural, and subarachnoid hemorrhage as detailed above. Suspected bilateral cerebral convexity subdural hygromas. No significant mass effect. Vascular imaging and contrast. enhanced MRI evaluation are recommended. MRI BRAIN without contrast:  1. 2.1 cm acute intraparenchymal hemorrhage positioned at the posterior right parietal convexity, stable from previous CT. Mild surrounding edema without significant regional mass effect or midline shift. Etiology of this hemorrhage is unclear, with no underlying mass or other abnormality evident on this noncontrast examination. 2. Additional scattered small volume subarachnoid hemorrhage, predominantly localized at the left sylvian fissure, also stable from prior. 3. Superimposed small bilateral subdural hygromas/effusions without significant mass effect or midline shift. 4. Underlying age-related cerebral atrophy with mild chronic small vessel ischemic disease. MRA  HEAD:  1. 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 Distal reconstitution at the supraclinoid segment with attenuated but patent flow throughout the left MCA distribution. Appearance is relatively similar to prior CTA from 2019. 2. Otherwise stable patency of the intracranial arterial circulation. No hemodynamically significant or correctable stenosis. 3. No vascular abnormality to explain the acute intracranial hemorrhage is identified.   MRA NECK:  1. Occlusion of the left ICA at its origin, stable from prior. 2. Atheromatous change about the right carotid  bifurcation without hemodynamically significant stenosis. Right  carotid system remains widely patent within the neck. 3. Short-segment mild-to-moderate stenosis at the origin of the left vertebral artery. Otherwise wide patency of both vertebral arteries within the neck.   2D Echo LVEF 60-65%, severely dilated LA, atrial septum grossly normal.      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   Gout    Hypertension    Prostate cancer ([place])    Quadriceps tendon rupture, right, initial encounter     PSH:  Past Surgical History:  Procedure Laterality Date   CYST EXCISION     HYDROCELE EXCISION Right    PROSTATE BIOPSY     QUADRICEPS TENDON REPAIR Right 01/14/2018   Procedure: REPAIR RIGHT QUADRICEPS TENDON;  Surgeon: Leandrew Koyanagi, MD;  Location: [place];  Service: Orthopedics;  Laterality: Right;    Social History:  Social History   Socioeconomic History   Marital status: Married    Spouse name: Not on file   Number of children: Not on file   Years of education: Not on file   Highest education level: Not on file  Occupational History   Not on file  Tobacco Use   Smoking status: Never   Smokeless tobacco: Never  Vaping Use   Vaping Use: Never used  Substance and Sexual Activity   Alcohol use: Not Currently    Comment: social   Drug use: No   Sexual activity: Not Currently  Other Topics Concern   Not on file  Social History Narrative   Not on file   Social Determinants of Health   Financial Resource Strain: Low Risk    Difficulty of Paying Living Expenses: Not hard at all  Food Insecurity: No Food Insecurity   Worried About Charity fundraiser in the Last Year: Never true   [place] in the Last Year: Never true  Transportation Needs: No Transportation Needs   Lack of Transportation (Medical): No   Lack of Transportation (Non-Medical): No  Physical Activity:  Sufficiently Active   Days of Exercise per Week: 6 days   Minutes of Exercise per Session: 90 min  Stress: No Stress Concern Present   Feeling of Stress : Not at all  Social Connections: Moderately Integrated   Frequency of Communication with Friends and Family: More than three times a week   Frequency of Social Gatherings with Friends and Family: Three times a week   Attends Religious Services: 1 to 4 times per year   Active Member of Clubs or Organizations: No   Attends Archivist Meetings: Never   Marital Status: Married  Human resources officer Violence: Not At Risk   Fear of Current or Ex-Partner: No   Emotionally Abused: No   Physically Abused: No   Sexually Abused: No    Family History:  Family History  Problem Relation Age of Onset   Heart failure Mother    Cancer - Colon Father    Dementia Sister    Throat cancer Brother    Pancreatic cancer Neg Hx    Breast cancer Neg Hx    Prostate cancer Neg Hx     Medications:   Current Outpatient Medications on File Prior to Visit  Medication Sig Dispense Refill   amLODipine (NORVASC) 5 MG tablet Take 5 mg by mouth daily.  azelastine (ASTELIN) 0.1 % nasal spray Place 2 sprays into both nostrils 2 (two) times daily. 30 mL 12   hydrochlorothiazide (MICROZIDE) 12.5 MG capsule Take 12.5 mg by mouth daily.     lisinopril (ZESTRIL) 40 MG tablet Take by mouth daily.     spironolactone (ALDACTONE) 25 MG tablet Take 25 mg by mouth daily.     tamsulosin (FLOMAX) 0.4 MG CAPS capsule Take 0.4 mg by mouth daily.     No current facility-administered medications on file prior to visit.    Allergies:  No Known Allergies    OBJECTIVE:  Physical Exam  Vitals:   03/15/21 1455  BP: 132/80  Pulse: 84  SpO2: 98%  Weight: 183 lb (83 kg)  Height: 5\' 10"  (1.778 m)   Body mass index is 26.26 kg/m. No results found.  General: well developed, well nourished, very pleasant elderly Caucasian male Caucasian male, Caucasian male,  seated, in no evident distress Head: head normocephalic and atraumatic.   Neck: supple with no carotid or supraclavicular bruits Cardiovascular: regular rate and rhythm, no murmurs Musculoskeletal: no deformity Skin:  no rash/petichiae Vascular:  Normal pulses all extremities   Neurologic Exam Mental Status: Awake and fully alert. Fluent speech and language.  Oriented to place and time. Recent and remote memory intact. Attention span, concentration and fund of knowledge appropriate. Mood and affect appropriate.  Cranial Nerves: Fundoscopic exam reveals sharp disc margins. Pupils equal, briskly reactive to light. Extraocular movements full without nystagmus. Visual fields full to confrontation. Hearing intact. Facial sensation intact. Face, tongue, palate moves normally and symmetrically.  Motor: Normal bulk and tone. Normal strength in all tested extremity muscles Sensory.: intact to touch , pinprick , position and vibratory sensation.  Coordination: Rapid alternating movements normal in all extremities. Finger-to-nose and heel-to-shin performed accurately bilaterally. Gait and Station: Arises from chair without difficulty. Stance is normal. Gait demonstrates normal stride length and balance without use of AD. Tandem walk and heel toe not attempted.  Reflexes: 1+ and symmetric. Toes downgoing.        ASSESSMENT/PLAN: Adam Gonzales is a 76 y.o. year old male with traumatic ICH and SAH 12/02/2020, right high convexity small IPH and left sylvian fissure small SAH without residual deficit. 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 Recommend restarting aspirin 81mg  daily for known chronic left ICA occlusion which can increase risk of strokes  Carotid occlusion: discussed aggressive medication management with use of Asprin, statin, healthy diet and routine physical exercise. Ongoing monitoring per  PCP     Overall stable - follow up as needed   CC:  [facility] provider: Dr. Leonie Man PCP: Vivi Barrack, MD    I spent 46 minutes of face-to-face and non-face-to-face time with patient.  This included previsit chart review including review of recent hospitalization, lab review, study review, order entry, electronic health record documentation, patient education regarding recent traumatic ICH, review of repeat CTH, indication for aspirin and chronic carotid occlusion and answered all other questions to patient satisfaction   Frann Rider, AGNP-BC  [facility] [address] [place], [place] [address]  Phone [phone] Fax [phone] Note: This document was prepared with digital dictation and possible smart phrase technology. Any transcriptional errors that result from this process are unintentional.

## 2021-03-15 NOTE — Patient Instructions (Addendum)
Your Plan:  Would recommend restarting aspirin 81mg  daily due to carotid occlusion which can increase your risk of strokes   Okay to follow up as needed     Thank you for coming to see [place] at [facility]. I hope we have been able to provide you high quality care today.  You may receive a patient satisfaction survey over the next few weeks. We would appreciate your feedback and comments so that we may continue to improve ourselves and the health of our patients.

## 2021-03-21 ENCOUNTER — Ambulatory Visit (INDEPENDENT_AMBULATORY_CARE_PROVIDER_SITE_OTHER): Payer: Medicare Other

## 2021-03-21 ENCOUNTER — Other Ambulatory Visit: Payer: Self-pay

## 2021-03-21 VITALS — BP 138/76 | HR 67 | Temp 98.1°F | Wt 177.6 lb

## 2021-03-21 DIAGNOSIS — Z0000 Encounter for general adult medical examination without abnormal findings: Secondary | ICD-10-CM

## 2021-03-21 NOTE — Progress Notes (Signed)
Subjective:   Adam Gonzales is a 76 y.o. male who presents for Medicare Annual/Subsequent preventive examination.  Review of Systems     Cardiac Risk Factors include: advanced age (>52men, >48 women);hypertension;male gender     Objective:    Today's Vitals   03/21/21 1528  BP: 138/76  Pulse: 67  Temp: 98.1 F (36.7 C)  SpO2: 97%  Weight: 177 lb 9.6 oz (80.6 kg)   Body mass index is 25.48 kg/m.  Advanced Directives 03/21/2021 03/15/2020 08/12/2019 03/18/2019 01/14/2018 01/08/2018 01/02/2018  Does Patient Have a Medical Advance Directive? No Yes Yes Yes Yes Yes No  Type of Advance Directive - [place];Living will [place];Living will [place];Living will Living will Living will -  Does patient want to make changes to medical advance directive? - - - - No - Patient declined No - Patient declined -  Copy of [place] in Chart? - No - copy requested - No - copy requested - - -  Would patient like information on creating a medical advance directive? No - Patient declined - - - No - Patient declined - No - Patient declined    Current Medications (verified) Outpatient Encounter Medications as of 03/21/2021  Medication Sig   amLODipine (NORVASC) 5 MG tablet Take 5 mg by mouth daily. At bedtime   aspirin EC 81 MG tablet Take 1 tablet (81 mg total) by mouth daily. Swallow whole.   azelastine (ASTELIN) 0.1 % nasal spray Place 2 sprays into both nostrils 2 (two) times daily.   hydrochlorothiazide (MICROZIDE) 12.5 MG capsule Take 12.5 mg by mouth daily.   lisinopril (ZESTRIL) 40 MG tablet Take by mouth daily.   spironolactone (ALDACTONE) 25 MG tablet Take 25 mg by mouth daily. At bedtime   tamsulosin (FLOMAX) 0.4 MG CAPS capsule Take 0.4 mg by mouth daily. At bedtime   No facility-administered encounter medications on file as of 03/21/2021.    Allergies (verified) Patient has no known allergies.    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   Gout    Hypertension    Prostate cancer ([place])    Quadriceps tendon rupture, right, initial encounter    Past Surgical History:  Procedure Laterality Date   CYST EXCISION     HYDROCELE EXCISION Right    PROSTATE BIOPSY     QUADRICEPS TENDON REPAIR Right 01/14/2018   Procedure: REPAIR RIGHT QUADRICEPS TENDON;  Surgeon: Leandrew Koyanagi, MD;  Location: [facility];  Service: Orthopedics;  Laterality: Right;   Family History  Problem Relation Age of Onset   Heart failure Mother    Cancer - Colon Father    Dementia Sister    Throat cancer Brother    Pancreatic cancer Neg Hx    Breast cancer Neg Hx    Prostate cancer Neg Hx    Social History   Socioeconomic History   Marital status: Married    Spouse name: Not on file   Number of children: Not on file   Years of education: Not on file   Highest education level: Not on file  Occupational History   Not on file  Tobacco Use   Smoking status: Never   Smokeless tobacco: Never  Vaping Use   Vaping Use: Never used  Substance and Sexual Activity   Alcohol use: Not Currently    Comment: social   Drug use: No   Sexual  activity: Not Currently  Other Topics Concern   Not on file  Social History Narrative   Not on file   Social Determinants of Health   Financial Resource Strain: Low Risk    Difficulty of Paying Living Expenses: Not hard at all  Food Insecurity: No Food Insecurity   Worried About Charity fundraiser in the Last Year: Never true   [place] in the Last Year: Never true  Transportation Needs: No Transportation Needs   Lack of Transportation (Medical): No   Lack of Transportation (Non-Medical): No  Physical Activity: Sufficiently Active   Days of Exercise per Week: 6 days   Minutes of Exercise per Session: 90 min  Stress: No Stress Concern Present   Feeling of Stress : Not at all  Social Connections:  Moderately Integrated   Frequency of Communication with Friends and Family: More than three times a week   Frequency of Social Gatherings with Friends and Family: More than three times a week   Attends Religious Services: More than 4 times per year   Active Member of [employer] or Organizations: No   Attends Music therapist: Never   Marital Status: Married    Tobacco Counseling Counseling given: Not Answered   Clinical Intake:  Pre-visit preparation completed: Yes  Pain : No/denies pain     BMI - recorded: 26.26 Nutritional Status: BMI 25 -29 Overweight Nutritional Risks: None Diabetes: No  How often do you need to have someone help you when you read instructions, pamphlets, or other written materials from your doctor or pharmacy?: 1 - Never  Diabetic?no  Interpreter Needed?: No  Information entered by :: Charlott Rakes, LPN   Activities of Daily Living In your present state of health, do you have any difficulty performing the following activities: 03/21/2021 12/03/2020  Hearing? Y N  Vision? N N  Difficulty concentrating or making decisions? N N  Walking or climbing stairs? N N  Dressing or bathing? N N  Doing errands, shopping? N -  Preparing Food and eating ? N -  Using the Toilet? N -  In the past six months, have you accidently leaked urine? Y -  Comment at times -  Do you have problems with loss of bowel control? N -  Managing your Medications? N -  Managing your Finances? N -  Housekeeping or managing your Housekeeping? N -  Some recent data might be hidden    Patient Care Team: Vivi Barrack, MD as PCP - General (Family Medicine) Ardis Hughs, MD as Attending Physician (Urology) Augustina Mood, DDS as Referring Physician (Dentistry) Deterding, Jeneen Rinks, MD as Consulting Physician (Nephrology) Leandrew Koyanagi, MD as Attending Physician (Orthopedic Surgery) Ronald Lobo, MD as Consulting Physician (Gastroenterology) Tyler Pita,  MD as Consulting Physician (Radiation Oncology) Marygrace Drought, MD as Consulting Physician (Ophthalmology)  Indicate any recent Medical Services you may have received from other than [facility] providers in the past year (date may be approximate).     Assessment:   This is a routine wellness examination for Adam Gonzales.  Hearing/Vision screen Hearing Screening - Comments:: Pt stated right ear slight loss  Vision Screening - Comments:: Pt follows up with provider for annual eye exams   Dietary issues and exercise activities discussed: Current Exercise Habits: Home exercise routine, Type of exercise: Other - see comments, Time (Minutes): 60, Frequency (Times/Week): 6, Weekly Exercise (Minutes/Week): 360   Goals Addressed  This Visit's Progress    Patient Stated       Patient Stated       None at this time        Depression Screen PHQ 2/9 Scores 03/21/2021 01/06/2021 12/13/2020 09/14/2020 03/15/2020  PHQ - 2 Score 0 0 0 0 0    Fall Risk Fall Risk  03/21/2021 09/14/2020 03/15/2020  Falls in the past year? 0 0 0  Number falls in past yr: 0 0 0  Injury with Fall? 0 0 0  Risk for fall due to : - No Fall Risks -  Follow up Falls prevention discussed - Falls prevention discussed    FALL RISK PREVENTION PERTAINING TO THE HOME:  Any stairs in or around the home? Yes  If so, are there any without handrails? Yes  Home free of loose throw rugs in walkways, pet beds, electrical cords, etc? Yes  Adequate lighting in your home to reduce risk of falls? Yes   ASSISTIVE DEVICES UTILIZED TO PREVENT FALLS:  Life alert? No  Use of a cane, walker or w/c? No  Grab bars in the bathroom? No  Shower chair or bench in shower? No  Elevated toilet seat or a handicapped toilet? No   TIMED UP AND GO:  Was the test performed? Yes .  Length of time to ambulate 10 feet: 10 sec.   Gait steady and fast without use of assistive device  Cognitive Function:     6CIT Screen 03/15/2020  What Year? 0  points  What month? 0 points  Count back from 20 0 points  Months in reverse 0 points  Repeat phrase 4 points    Immunizations Immunization History  Administered Date(s) Administered   Fluad Quad(high Dose 65+) 01/14/2020   Influenza, High Dose Seasonal PF 01/08/2014, 12/17/2014, 12/08/2015, 12/18/2017, 11/12/2018   Influenza-Unspecified 11/06/2020, 12/11/2020   PFIZER(Purple Top)SARS-COV-2 Vaccination 03/29/2019, 04/22/2019, 12/20/2019, 07/22/2020   Pneumococcal Conjugate-13 03/25/2020   Zoster Recombinat (Shingrix) 09/11/2017, 11/24/2017    TDAP status: Due, Education has been provided regarding the importance of this vaccine. Advised may receive this vaccine at local pharmacy or Health Dept. Aware to provide a copy of the vaccination record if obtained from local pharmacy or Health Dept. Verbalized acceptance and understanding.  Flu Vaccine status: Up to date  Pneumococcal vaccine status: Due, Education has been provided regarding the importance of this vaccine. Advised may receive this vaccine at local pharmacy or Health Dept. Aware to provide a copy of the vaccination record if obtained from local pharmacy or Health Dept. Verbalized acceptance and understanding.  Covid-19 vaccine status: Completed vaccines  Qualifies for Shingles Vaccine? Yes   Zostavax completed Yes   Shingrix Completed?: Yes  Screening Tests Health Maintenance  Topic Date Due   Hepatitis C Screening  Never done   TETANUS/TDAP  Never done   COVID-19 Vaccine (5 - Booster for Pfizer series) 09/16/2020   Pneumonia Vaccine 68+ Years old (2 - PPSV23 if available, else PCV20) 03/25/2021   COLONOSCOPY (Pts 45-35yrs Insurance coverage will need to be confirmed)  10/28/2025   INFLUENZA VACCINE  Completed   Zoster Vaccines- Shingrix  Completed   HPV VACCINES  Aged Out    Health Maintenance  Health Maintenance Due  Topic Date Due   Hepatitis C Screening  Never done   TETANUS/TDAP  Never done   COVID-19  Vaccine (5 - Booster for Pfizer series) 09/16/2020   Pneumonia Vaccine 24+ Years old (2 - PPSV23 if available, else PCV20) 03/25/2021  Colorectal cancer screening: Type of screening: Colonoscopy. Completed 10/28/20. Repeat every 5 years   Additional Screening:  Hepatitis C Screening: does qualify;  Vision Screening: Recommended annual ophthalmology exams for early detection of glaucoma and other disorders of the eye. Is the patient up to date with their annual eye exam?  Yes  Who is the provider or what is the name of the office in which the patient attends annual eye exams? Uncure of provider name If pt is not established with a provider, would they like to be referred to a provider to establish care? No .   Dental Screening: Recommended annual dental exams for proper oral hygiene  Community Resource Referral / Chronic Care Management: CRR required this visit?  No   CCM required this visit?  No      Plan:     I have personally reviewed and noted the following in the patients chart:   Medical and social history Use of alcohol, tobacco or illicit drugs  Current medications and supplements including opioid prescriptions. Patient is not currently taking opioid prescriptions. Functional ability and status Nutritional status Physical activity Advanced directives List of other physicians Hospitalizations, surgeries, and ER visits in previous 12 months Vitals Screenings to include cognitive, depression, and falls Referrals and appointments  In addition, I have reviewed and discussed with patient certain preventive protocols, quality metrics, and best practice recommendations. A written personalized care plan for preventive services as well as general preventive health recommendations were provided to patient.     Willette Brace, LPN   [id number]   Nurse Notes: None

## 2021-03-21 NOTE — Patient Instructions (Signed)
Adam Gonzales , Thank you for taking time to come for your Medicare Wellness Visit. I appreciate your ongoing commitment to your health goals. Please review the following plan we discussed and let me know if I can assist you in the future.   Screening recommendations/referrals: Colonoscopy: Done 10/28/20 repeat every 5 years  Recommended yearly ophthalmology/optometry visit for glaucoma screening and checkup Recommended yearly dental visit for hygiene and checkup  Vaccinations: Influenza vaccine: Done 12/11/20 repeat every year Pneumococcal vaccine: Due and discussed  Tdap vaccine: Due and discussed  Shingles vaccine: Completed  8/619, 11/24/17 Covid-19: Completed 2/20, 3/16, 12/26/19 & 07/22/20, 11/06/20  Advanced directives: Advance directive discussed with you today. Even though you declined this today please call our office should you change your mind and we can give you the proper paperwork for you to fill out.  Conditions/risks identified: None at this time   Next appointment: Follow up in one year for your annual wellness visit.   Preventive Care 43 Years and Older, Male Preventive care refers to lifestyle choices and visits with your health care provider that can promote health and wellness. What does preventive care include? A yearly physical exam. This is also called an annual well check. Dental exams once or twice a year. Routine eye exams. Ask your health care provider how often you should have your eyes checked. Personal lifestyle choices, including: Daily care of your teeth and gums. Regular physical activity. Eating a healthy diet. Avoiding tobacco and drug use. Limiting alcohol use. Practicing safe sex. Taking low doses of aspirin every day. Taking vitamin and mineral supplements as recommended by your health care provider. What happens during an annual well check? The services and screenings done by your health care provider during your annual well check will depend on  your age, overall health, lifestyle risk factors, and family history of disease. Counseling  Your health care provider may ask you questions about your: Alcohol use. Tobacco use. Drug use. Emotional well-being. Home and relationship well-being. Sexual activity. Eating habits. History of falls. Memory and ability to understand (cognition). Work and work Statistician. Screening  You may have the following tests or measurements: Height, weight, and BMI. Blood pressure. Lipid and cholesterol levels. These may be checked every 5 years, or more frequently if you are over 74 years old. Skin check. Lung cancer screening. You may have this screening every year starting at age 40 if you have a 30-pack-year history of smoking and currently smoke or have quit within the past 15 years. Fecal occult blood test (FOBT) of the stool. You may have this test every year starting at age 74. Flexible sigmoidoscopy or colonoscopy. You may have a sigmoidoscopy every 5 years or a colonoscopy every 10 years starting at age 74. Prostate cancer screening. Recommendations will vary depending on your family history and other risks. Hepatitis C blood test. Hepatitis B blood test. Sexually transmitted disease (STD) testing. Diabetes screening. This is done by checking your blood sugar (glucose) after you have not eaten for a while (fasting). You may have this done every 1-3 years. Abdominal aortic aneurysm (AAA) screening. You may need this if you are a current or former smoker. Osteoporosis. You may be screened starting at age 74 if you are at high risk. Talk with your health care provider about your test results, treatment options, and if necessary, the need for more tests. Vaccines  Your health care provider may recommend certain vaccines, such as: Influenza vaccine. This is recommended every year. Tetanus,  diphtheria, and acellular pertussis (Tdap, Td) vaccine. You may need a Td booster every 10 years. Zoster  vaccine. You may need this after age 74. Pneumococcal 13-valent conjugate (PCV13) vaccine. One dose is recommended after age 74. Pneumococcal polysaccharide (PPSV23) vaccine. One dose is recommended after age 70. Talk to your health care provider about which screenings and vaccines you need and how often you need them. This information is not intended to replace advice given to you by your health care provider. Make sure you discuss any questions you have with your health care provider. Document Released: 02/19/2015 Document Revised: 10/13/2015 Document Reviewed: 11/24/2014 Elsevier Interactive Patient Education  2017 [place] Prevention in the Home Falls can cause injuries. They can happen to people of all ages. There are many things you can do to make your home safe and to help prevent falls. What can I do on the outside of my home? Regularly fix the edges of walkways and driveways and fix any cracks. Remove anything that might make you trip as you walk through a door, such as a raised step or threshold. Trim any bushes or trees on the path to your home. Use bright outdoor lighting. Clear any walking paths of anything that might make someone trip, such as rocks or tools. Regularly check to see if handrails are loose or broken. Make sure that both sides of any steps have handrails. Any raised decks and porches should have guardrails on the edges. Have any leaves, snow, or ice cleared regularly. Use sand or salt on walking paths during winter. Clean up any spills in your garage right away. This includes oil or grease spills. What can I do in the bathroom? Use night lights. Install grab bars by the toilet and in the tub and shower. Do not use towel bars as grab bars. Use non-skid mats or decals in the tub or shower. If you need to sit down in the shower, use a plastic, non-slip stool. Keep the floor dry. Clean up any water that spills on the floor as soon as it happens. Remove  soap buildup in the tub or shower regularly. Attach bath mats securely with double-sided non-slip rug tape. Do not have throw rugs and other things on the floor that can make you trip. What can I do in the bedroom? Use night lights. Make sure that you have a light by your bed that is easy to reach. Do not use any sheets or blankets that are too big for your bed. They should not hang down onto the floor. Have a firm chair that has side arms. You can use this for support while you get dressed. Do not have throw rugs and other things on the floor that can make you trip. What can I do in the kitchen? Clean up any spills right away. Avoid walking on wet floors. Keep items that you use a lot in easy-to-reach places. If you need to reach something above you, use a strong step stool that has a grab bar. Keep electrical cords out of the way. Do not use floor polish or wax that makes floors slippery. If you must use wax, use non-skid floor wax. Do not have throw rugs and other things on the floor that can make you trip. What can I do with my stairs? Do not leave any items on the stairs. Make sure that there are handrails on both sides of the stairs and use them. Fix handrails that are broken or loose. Make  sure that handrails are as long as the stairways. Check any carpeting to make sure that it is firmly attached to the stairs. Fix any carpet that is loose or worn. Avoid having throw rugs at the top or bottom of the stairs. If you do have throw rugs, attach them to the floor with carpet tape. Make sure that you have a light switch at the top of the stairs and the bottom of the stairs. If you do not have them, ask someone to add them for you. What else can I do to help prevent falls? Wear shoes that: Do not have high heels. Have rubber bottoms. Are comfortable and fit you well. Are closed at the toe. Do not wear sandals. If you use a stepladder: Make sure that it is fully opened. Do not climb a  closed stepladder. Make sure that both sides of the stepladder are locked into place. Ask someone to hold it for you, if possible. Clearly mark and make sure that you can see: Any grab bars or handrails. First and last steps. Where the edge of each step is. Use tools that help you move around (mobility aids) if they are needed. These include: Canes. Walkers. Scooters. Crutches. Turn on the lights when you go into a dark area. Replace any light bulbs as soon as they burn out. Set up your furniture so you have a clear path. Avoid moving your furniture around. If any of your floors are uneven, fix them. If there are any pets around you, be aware of where they are. Review your medicines with your doctor. Some medicines can make you feel dizzy. This can increase your chance of falling. Ask your doctor what other things that you can do to help prevent falls. This information is not intended to replace advice given to you by your health care provider. Make sure you discuss any questions you have with your health care provider. Document Released: 11/19/2008 Document Revised: 07/01/2015 Document Reviewed: 02/27/2014 Elsevier Interactive Patient Education  2017 [employer].

## 2021-04-01 ENCOUNTER — Telehealth: Payer: Self-pay | Admitting: Family Medicine

## 2021-04-01 NOTE — Telephone Encounter (Signed)
Spoke with pt verified DOB and pt asked what immunizations we were showing needed to be updated so he could have [facility] fax over records. Advised office fax number.

## 2021-04-01 NOTE — Telephone Encounter (Signed)
Patient has requested a call at [phone]. Pt wants to know what he needs to update his immunization records.

## 2021-04-12 DIAGNOSIS — I129 Hypertensive chronic kidney disease with stage 1 through stage 4 chronic kidney disease, or unspecified chronic kidney disease: Secondary | ICD-10-CM | POA: Diagnosis not present

## 2021-04-12 DIAGNOSIS — E269 Hyperaldosteronism, unspecified: Secondary | ICD-10-CM | POA: Diagnosis not present

## 2021-04-12 LAB — BASIC METABOLIC PANEL
BUN: 25 — AB (ref 4–21)
CO2: 26 — AB (ref 13–22)
Chloride: 100 (ref 99–108)
Creatinine: 1.2 (ref 0.6–1.3)
Glucose: 99
Potassium: 3.7 mEq/L (ref 3.5–5.1)
Sodium: 136 — AB (ref 137–147)

## 2021-04-12 LAB — COMPREHENSIVE METABOLIC PANEL
Calcium: 10.2 (ref 8.7–10.7)
eGFR: 61

## 2021-04-14 ENCOUNTER — Encounter: Payer: Self-pay | Admitting: Family Medicine

## 2021-04-14 ENCOUNTER — Telehealth: Payer: Self-pay | Admitting: Family Medicine

## 2021-04-14 NOTE — Telephone Encounter (Signed)
Pt is wanting to know what vaccinations he may be missing. Please advise ?

## 2021-04-14 NOTE — Telephone Encounter (Signed)
Patient notified needing Pneumonia 20 vaccine  ?Stated will let us know when vaccine done  ?

## 2021-04-21 ENCOUNTER — Encounter: Payer: Self-pay | Admitting: Podiatry

## 2021-04-21 MED ORDER — METHYLPREDNISOLONE 4 MG PO TBPK: ORAL_TABLET | ORAL | 0 refills | Status: AC

## 2021-04-29 DIAGNOSIS — Z8546 Personal history of malignant neoplasm of prostate: Secondary | ICD-10-CM | POA: Diagnosis not present

## 2021-04-29 LAB — PSA: PSA: 0.1

## 2021-05-04 NOTE — Telephone Encounter (Signed)
Please advise 

## 2021-05-06 DIAGNOSIS — Z8546 Personal history of malignant neoplasm of prostate: Secondary | ICD-10-CM | POA: Diagnosis not present

## 2021-05-06 DIAGNOSIS — N5201 Erectile dysfunction due to arterial insufficiency: Secondary | ICD-10-CM | POA: Diagnosis not present

## 2021-05-09 DIAGNOSIS — J310 Chronic rhinitis: Secondary | ICD-10-CM | POA: Diagnosis not present

## 2021-05-09 DIAGNOSIS — H9313 Tinnitus, bilateral: Secondary | ICD-10-CM | POA: Diagnosis not present

## 2021-05-09 DIAGNOSIS — J342 Deviated nasal septum: Secondary | ICD-10-CM | POA: Diagnosis not present

## 2021-05-09 DIAGNOSIS — H903 Sensorineural hearing loss, bilateral: Secondary | ICD-10-CM | POA: Diagnosis not present

## 2021-06-02 ENCOUNTER — Encounter: Payer: Medicare Other | Admitting: Family Medicine

## 2021-06-15 ENCOUNTER — Encounter: Payer: Self-pay | Admitting: Family Medicine

## 2021-07-19 ENCOUNTER — Telehealth: Payer: Self-pay | Admitting: Family Medicine

## 2021-07-19 NOTE — Telephone Encounter (Signed)
Caller states a death certificate was sent through the online system to PCP on 07/17/21  DAVE# [id number]  Available for questions, as needed.

## 2021-07-19 NOTE — Telephone Encounter (Signed)
See note

## 2021-08-06 DEATH — deceased

## 2021-08-10 NOTE — Telephone Encounter (Addendum)
Caller states family reached out about time of death on certificate as being incorrect.  Caller requests a call back to know if the 12:00 am time of death was a default time or if the 3:00 pm time of death was omitted for a reason.  After receiving this information, the caller states they will determine how to move forward.

## 2021-08-11 NOTE — Telephone Encounter (Signed)
LV with male. She will be back in the office tomorrow after 12 Will call back tomorrow

## 2021-08-11 NOTE — Telephone Encounter (Signed)
Please advise 

## 2021-08-11 NOTE — Telephone Encounter (Signed)
1200AM is used as the default time when the exact time of death is not known to the person completing the form at the time the form is completed  This should not have any major implications however if they wish for the exact time of death to be listed on the death certificate they can contact the [place] [employer] of Vital Records to request an amendment.  Algis Greenhouse. [name], MD 08/11/2021 10:33 AM

## 2021-08-12 NOTE — Telephone Encounter (Signed)
Spoke with Adam Gonzales gave information below  [place] stated will follow up with patient family

## 2021-08-12 NOTE — Telephone Encounter (Signed)
Returned patient wife call. Patient wife question why on his death certificated stated possible MI if the paramedic told her patient had a pulmonary embolism. Wife want to know for his Childrens medical health Hx  Also was questioning about time of death, informed her it was used the default time since exact time was unknown when form was completed  She also ask if patient was on cholesterol medication

## 2021-08-15 NOTE — Telephone Encounter (Signed)
It is impossible for EMT to know if he had a PE and they should not have told her that he had one. He had no risk factors for PE and it is very unlikely that is his cause of death. It is much more likely that he died from a heart attack given his medical history.   He did have a known history of vascular disease since his hospitalization last year and was started on a cholesterol medication by neurology for an occluded carotid artery.   Adam Gonzales. [name], MD 08/15/2021 8:52 AM

## 2021-09-15 ENCOUNTER — Encounter: Payer: Medicare Other | Admitting: Family Medicine
